# Patient Record
Sex: Female | Born: 1950 | Race: White | Hispanic: No | Marital: Married | Smoking: Former smoker
Health system: Southern US, Community
[De-identification: ages and names within clinical notes are randomized; demographics above are authoritative.]

## PROBLEM LIST (undated history)

## (undated) DIAGNOSIS — J45909 Unspecified asthma, uncomplicated: Secondary | ICD-10-CM

## (undated) DIAGNOSIS — G43909 Migraine, unspecified, not intractable, without status migrainosus: Secondary | ICD-10-CM

## (undated) DIAGNOSIS — K219 Gastro-esophageal reflux disease without esophagitis: Secondary | ICD-10-CM

## (undated) DIAGNOSIS — R413 Other amnesia: Secondary | ICD-10-CM

## (undated) DIAGNOSIS — E785 Hyperlipidemia, unspecified: Secondary | ICD-10-CM

## (undated) DIAGNOSIS — H269 Unspecified cataract: Secondary | ICD-10-CM

## (undated) DIAGNOSIS — R2689 Other abnormalities of gait and mobility: Secondary | ICD-10-CM

## (undated) DIAGNOSIS — T7840XA Allergy, unspecified, initial encounter: Secondary | ICD-10-CM

## (undated) DIAGNOSIS — F419 Anxiety disorder, unspecified: Secondary | ICD-10-CM

## (undated) DIAGNOSIS — T8859XA Other complications of anesthesia, initial encounter: Secondary | ICD-10-CM

## (undated) DIAGNOSIS — I10 Essential (primary) hypertension: Secondary | ICD-10-CM

## (undated) HISTORY — DX: Gastro-esophageal reflux disease without esophagitis: K21.9

## (undated) HISTORY — PX: BREAST EXCISIONAL BIOPSY: SUR124

## (undated) HISTORY — PX: ABDOMINAL HYSTERECTOMY: SHX81

## (undated) HISTORY — PX: CATARACT EXTRACTION, BILATERAL: SHX1313

## (undated) HISTORY — PX: CARPAL TUNNEL RELEASE: SHX101

## (undated) HISTORY — PX: BLADDER REPAIR: SHX6721

## (undated) HISTORY — DX: Hyperlipidemia, unspecified: E78.5

## (undated) HISTORY — DX: Unspecified cataract: H26.9

## (undated) HISTORY — DX: Migraine, unspecified, not intractable, without status migrainosus: G43.909

## (undated) HISTORY — PX: BREAST SURGERY: SHX581

## (undated) HISTORY — DX: Anxiety disorder, unspecified: F41.9

## (undated) HISTORY — DX: Essential (primary) hypertension: I10

## (undated) HISTORY — DX: Allergy, unspecified, initial encounter: T78.40XA

## (undated) HISTORY — DX: Other abnormalities of gait and mobility: R26.89

## (undated) HISTORY — PX: BREAST BIOPSY: SHX20

## (undated) HISTORY — DX: Other amnesia: R41.3

---

## 2014-04-07 HISTORY — PX: ABDOMINAL HYSTERECTOMY: SHX81

## 2016-04-07 HISTORY — PX: CATARACT EXTRACTION, BILATERAL: SHX1313

## 2016-06-21 ENCOUNTER — Ambulatory Visit: Payer: Self-pay

## 2016-07-12 ENCOUNTER — Ambulatory Visit (INDEPENDENT_AMBULATORY_CARE_PROVIDER_SITE_OTHER): Payer: BLUE CROSS/BLUE SHIELD | Admitting: Family Medicine

## 2016-07-12 ENCOUNTER — Encounter: Payer: Self-pay | Admitting: Family Medicine

## 2016-07-12 VITALS — BP 145/91 | HR 100 | Temp 98.3°F | Resp 18 | Ht 61.0 in | Wt 135.0 lb

## 2016-07-12 DIAGNOSIS — G43709 Chronic migraine without aura, not intractable, without status migrainosus: Secondary | ICD-10-CM

## 2016-07-12 DIAGNOSIS — E7800 Pure hypercholesterolemia, unspecified: Secondary | ICD-10-CM

## 2016-07-12 DIAGNOSIS — F32A Depression, unspecified: Secondary | ICD-10-CM

## 2016-07-12 DIAGNOSIS — F329 Major depressive disorder, single episode, unspecified: Secondary | ICD-10-CM

## 2016-07-12 DIAGNOSIS — F419 Anxiety disorder, unspecified: Secondary | ICD-10-CM

## 2016-07-12 MED ORDER — TOPIRAMATE 50 MG PO TABS: 100.0000 mg | ORAL_TABLET | Freq: Three times a day (TID) | ORAL | 1 refills | Status: DC

## 2016-07-12 MED ORDER — PRAVASTATIN SODIUM 20 MG PO TABS: 20.0000 mg | ORAL_TABLET | Freq: Every day | ORAL | 1 refills | Status: DC

## 2016-07-12 MED ORDER — SUMATRIPTAN SUCCINATE 100 MG PO TABS: 100.0000 mg | ORAL_TABLET | Freq: Once | ORAL | 1 refills | Status: DC

## 2016-07-12 MED ORDER — SERTRALINE HCL 100 MG PO TABS: 100.0000 mg | ORAL_TABLET | Freq: Every day | ORAL | 1 refills | Status: DC

## 2016-07-12 NOTE — Progress Notes (Signed)
Subjective:    Patient ID: Caroline [name], female    DOB: 1950-07-26, 66 y.o.   MRN: [id number]  07/12/2016  Establish Care (Recently moved here from [place]) and Medication Refill (All medications)   HPI This 66 y.o. female presents to establish care.  Recently moved from [place] and needs refills of all medications.  Last physical:  2017 Pap smear: hysterectomy; last pap smear with hysterectomy Mammogram: last year; 2017 Colonoscopy:  Not sure; due in 3 years; no polyps; repeat ten years after last colonoscopy Eye exam:  Works for ophthalmologist; +glasses; one years ago.  +cataract R eye Dental exam:  Not sure.    Hypercholesterolemia: Patient reports good compliance with medication, good tolerance to medication, and good symptom control.    Migraines: Patient reports good compliance with medication, good tolerance to medication, and good symptom control.  Getting 4-8 per month; weather and stress induced; barometer sensitive.  Sugar is a trigger.  Processed foods trigger.   Alcohol is a trigger.  Anxiety disorder: medication ran out in [place]; moved 05/23/16.  Emotionally terrible.  Crying excessively.  Husband has lung cancer; two years out from surgery.     Review of Systems  Constitutional: Negative for chills, diaphoresis, fatigue and fever.  Eyes: Negative for visual disturbance.  Respiratory: Negative for cough and shortness of breath.   Cardiovascular: Negative for chest pain, palpitations and leg swelling.  Gastrointestinal: Negative for abdominal pain, constipation, diarrhea, nausea and vomiting.  Endocrine: Negative for cold intolerance, heat intolerance, polydipsia, polyphagia and polyuria.  Neurological: Positive for headaches. Negative for dizziness, tremors, seizures, syncope, facial asymmetry, speech difficulty, weakness, light-headedness and numbness.  Psychiatric/Behavioral: Positive for dysphoric mood and sleep disturbance. Negative for self-injury and  suicidal ideas. The patient is nervous/anxious.     Past Medical History:  Diagnosis Date  . Allergy    Zyrtec, Benadryl  . Anxiety    Zoloft since several years  . Cataract   . Hyperlipidemia   . Migraines    topmax and Imitrex   Past Surgical History:  Procedure Laterality Date  . ABDOMINAL HYSTERECTOMY     uterine prolapse; ovaries INTACT  . BLADDER REPAIR    . BREAST SURGERY     breast bx x 2 in 1980s; benign   Allergies  Allergen Reactions  . Tetracyclines & Related Nausea And Vomiting  . Vicodin [Hydrocodone-Acetaminophen] Nausea And Vomiting    Social History   Social History  . Marital status: Married    Spouse name: N/A  . Number of children: 2  . Years of education: N/A   Occupational History  . employed    Social History Main Topics  . Smoking status: Never Smoker  . Smokeless tobacco: Never Used  . Alcohol use Yes     Comment: rarely  . Drug use: No  . Sexual activity: Not on file   Other Topics Concern  . 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      Alcohol: rare      Exercise: none   Family History  Problem Relation Age of Onset  . Dementia Mother   . Cancer Father 50    oral cancer  . Heart disease Father  PAD/femoral bypass  . Hypertension Father   . Hyperlipidemia Father   . Alcohol abuse Father   . Hyperlipidemia Sister   . Hypertension Sister        Objective:    BP (!) 145/91   Pulse 100   Temp 98.3 F (36.8 C) (Oral)   Resp 18   Ht  (1.549 m)   Wt 135 lb (61.2 kg)   SpO2 97%   BMI 25.51 kg/m  Physical Exam  Constitutional: She is oriented to person, place, and time. She appears well-developed and well-nourished. No distress.  HENT:  Head: Normocephalic and atraumatic.  Right Ear:  External ear normal.  Left Ear: External ear normal.  Nose: Nose normal.  Mouth/Throat: Oropharynx is clear and moist.  Eyes: Conjunctivae and EOM are normal. Pupils are equal, round, and reactive to light.  Neck: Normal range of motion. Neck supple. Carotid bruit is not present. No thyromegaly present.  Cardiovascular: Normal rate, regular rhythm, normal heart sounds and intact distal pulses.  Exam reveals no gallop and no friction rub.   No murmur heard. Pulmonary/Chest: Effort normal and breath sounds normal. She has no wheezes. She has no rales.  Abdominal: Soft. Bowel sounds are normal. She exhibits no distension and no mass. There is no tenderness. There is no rebound and no guarding.  Lymphadenopathy:    She has no cervical adenopathy.  Neurological: She is alert and oriented to person, place, and time. No cranial nerve deficit.  Skin: Skin is warm and dry. No rash noted. She is not diaphoretic. No erythema. No pallor.  Psychiatric: She has a normal mood and affect. Her behavior is normal.   No results found for this or any previous visit.     Assessment & Plan:   1. Pure hypercholesterolemia   2. Anxiety and depression   3. Chronic migraine without aura without status migrainosus, not intractable    -establishing care after recent move from [place]. -anxiety and depression poorly controlled due to family stressors; counseling provided; consider/recommend psychotherapy; rx for Zoloft  daily. -refill of Topamax and Imitrex provided; continues to suffer with frequent migraines likely due to recent family stressors. -hypercholesterolemia stable; RTC next visit fasting for labs.   No orders of the defined types were placed in this encounter.  Meds ordered this encounter  Medications  . DISCONTD: topiramate (TOPAMAX) 100 MG tablet    Sig: Take 100 mg by mouth daily.  Marland Kitchen DISCONTD: sertraline (ZOLOFT) 100 MG tablet    Sig: Take 100 mg by mouth daily.  Marland Kitchen DISCONTD:  pravastatin (PRAVACHOL) 20 MG tablet    Sig: Take 20 mg by mouth daily.  Marland Kitchen DISCONTD: SUMAtriptan (IMITREX) 100 MG tablet    Sig: Take 100 mg by mouth daily as needed for migraine. May repeat in 2 hours if headache persists or recurs.  . pravastatin (PRAVACHOL) 20 MG tablet    Sig: Take 1 tablet (20 mg total) by mouth daily.    Dispense:  90 tablet    Refill:  1  . sertraline (ZOLOFT) 100 MG tablet    Sig: Take 1 tablet (100 mg total) by mouth daily.    Dispense:  90 tablet    Refill:  1  . topiramate (TOPAMAX) 50 MG tablet    Sig: Take 2 tablets (100 mg total) by mouth 3 (three) times daily.    Dispense:  270 tablet    Refill:  1  . SUMAtriptan (IMITREX) 100 MG tablet  Sig: Take 1 tablet (100 mg total) by mouth once. May repeat in 2 hours if headache persists.    Dispense:  24 tablet    Refill:  1    Return in about 6 months (around 01/11/2017) for complete physical examiniation.   Beverlyn Mcginness Paulita Fujita, M.D. Primary Care at [facility] previously [facility] [address] [place], [place]  [address] [phone] phone [phone] fax

## 2016-07-12 NOTE — Patient Instructions (Addendum)
IF you received an x-ray today, you will receive an invoice from [facility]. Please contact [facility] at [phone] with questions or concerns regarding your invoice.   IF you received labwork today, you will receive an invoice from [facility]. Please contact [facility] at [phone] with questions or concerns regarding your invoice.   Our billing staff will not be able to assist you with questions regarding bills from these companies.  You will be contacted with the lab results as soon as they are available. The fastest way to get your results is to activate your My Chart account. Instructions are located on the last page of this paperwork. If you have not heard from [place] regarding the results in 2 weeks, please contact this office.      Stress and Stress Management Stress is a normal reaction to life events. It is what you feel when life demands more than you are used to or more than you can handle. Some stress can be useful. For example, the stress reaction can help you catch the last bus of the day, study for a test, or meet a deadline at work. But stress that occurs too often or for too long can cause problems. It can affect your emotional health and interfere with relationships and normal daily activities. Too much stress can weaken your immune system and increase your risk for physical illness. If you already have a medical problem, stress can make it worse. What are the causes? All sorts of life events may cause stress. An event that causes stress for one person may not be stressful for another person. Major life events commonly cause stress. These may be positive or negative. Examples include losing your job, moving into a new home, getting married, having a baby, or losing a loved one. Less obvious life events may also cause stress, especially if they occur day after day or in combination. Examples include working long hours, driving in traffic, caring for children,  being in debt, or being in a difficult relationship. What are the signs or symptoms? Stress may cause emotional symptoms including, the following:  Anxiety. This is feeling worried, afraid, on edge, overwhelmed, or out of control.  Anger. This is feeling irritated or impatient.  Depression. This is feeling sad, down, helpless, or guilty.  Difficulty focusing, remembering, or making decisions. Stress may cause physical symptoms, including the following:  Aches and pains. These may affect your head, neck, back, stomach, or other areas of your body.  Tight muscles or clenched jaw.  Low energy or trouble sleeping. Stress may cause unhealthy behaviors, including the following:  Eating to feel better (overeating) or skipping meals.  Sleeping too little, too much, or both.  Working too much or putting off tasks (procrastination).  Smoking, drinking alcohol, or using drugs to feel better. How is this diagnosed? Stress is diagnosed through an assessment by your health care provider. Your health care provider will ask questions about your symptoms and any stressful life events.Your health care provider will also ask about your medical history and may order blood tests or other tests. Certain medical conditions and medicine can cause physical symptoms similar to stress. Mental illness can cause emotional symptoms and unhealthy behaviors similar to stress. Your health care provider may refer you to a mental health professional for further evaluation. How is this treated? Stress management is the recommended treatment for stress.The goals of stress management are reducing stressful life events and coping with stress in healthy ways.  Techniques for reducing stressful life events include the following:  Stress identification. Self-monitor for stress and identify what causes stress for you. These skills may help you to avoid some stressful events.  Time management. Set your priorities, keep a  calendar of events, and learn to say "no." These tools can help you avoid making too many commitments. Techniques for coping with stress include the following:  Rethinking the problem. Try to think realistically about stressful events rather than ignoring them or overreacting. Try to find the positives in a stressful situation rather than focusing on the negatives.  Exercise. Physical exercise can release both physical and emotional tension. The key is to find a form of exercise you enjoy and do it regularly.  Relaxation techniques. These relax the body and mind. Examples include yoga, meditation, tai chi, biofeedback, deep breathing, progressive muscle relaxation, listening to music, being out in nature, journaling, and other hobbies. Again, the key is to find one or more that you enjoy and can do regularly.  Healthy lifestyle. Eat a balanced diet, get plenty of sleep, and do not smoke. Avoid using alcohol or drugs to relax.  Strong support network. Spend time with family, friends, or other people you enjoy being around.Express your feelings and talk things over with someone you trust. Counseling or talktherapy with a mental health professional may be helpful if you are having difficulty managing stress on your own. Medicine is typically not recommended for the treatment of stress.Talk to your health care provider if you think you need medicine for symptoms of stress. Follow these instructions at home:  Keep all follow-up visits as directed by your health care provider.  Take all medicines as directed by your health care provider. Contact a health care provider if:  Your symptoms get worse or you start having new symptoms.  You feel overwhelmed by your problems and can no longer manage them on your own. Get help right away if:  You feel like hurting yourself or someone else. This information is not intended to replace advice given to you by your health care provider. Make sure you  discuss any questions you have with your health care provider. Document Released: 09/17/2000 Document Revised: 08/30/2015 Document Reviewed: 11/16/2012 Elsevier Interactive Patient Education  2017 [employer].

## 2016-08-11 DIAGNOSIS — G43709 Chronic migraine without aura, not intractable, without status migrainosus: Secondary | ICD-10-CM | POA: Insufficient documentation

## 2016-08-11 DIAGNOSIS — F32A Depression, unspecified: Secondary | ICD-10-CM | POA: Insufficient documentation

## 2016-08-11 DIAGNOSIS — F419 Anxiety disorder, unspecified: Secondary | ICD-10-CM

## 2016-08-11 DIAGNOSIS — E7800 Pure hypercholesterolemia, unspecified: Secondary | ICD-10-CM | POA: Insufficient documentation

## 2016-08-11 DIAGNOSIS — F329 Major depressive disorder, single episode, unspecified: Secondary | ICD-10-CM | POA: Insufficient documentation

## 2016-11-05 ENCOUNTER — Other Ambulatory Visit: Payer: Self-pay | Admitting: Family Medicine

## 2016-12-28 ENCOUNTER — Emergency Department (HOSPITAL_BASED_OUTPATIENT_CLINIC_OR_DEPARTMENT_OTHER): Payer: BLUE CROSS/BLUE SHIELD

## 2016-12-28 ENCOUNTER — Encounter (HOSPITAL_BASED_OUTPATIENT_CLINIC_OR_DEPARTMENT_OTHER): Payer: Self-pay | Admitting: Emergency Medicine

## 2016-12-28 ENCOUNTER — Emergency Department (HOSPITAL_BASED_OUTPATIENT_CLINIC_OR_DEPARTMENT_OTHER)
Admission: EM | Admit: 2016-12-28 | Discharge: 2016-12-28 | Disposition: A | Discharge status: Home / Self Care | Payer: BLUE CROSS/BLUE SHIELD | Attending: Emergency Medicine | Admitting: Emergency Medicine

## 2016-12-28 DIAGNOSIS — R000 Tachycardia, unspecified: Secondary | ICD-10-CM | POA: Diagnosis not present

## 2016-12-28 DIAGNOSIS — I10 Essential (primary) hypertension: Secondary | ICD-10-CM | POA: Insufficient documentation

## 2016-12-28 DIAGNOSIS — G43009 Migraine without aura, not intractable, without status migrainosus: Secondary | ICD-10-CM | POA: Diagnosis not present

## 2016-12-28 DIAGNOSIS — R51 Headache: Secondary | ICD-10-CM | POA: Diagnosis present

## 2016-12-28 LAB — COMPREHENSIVE METABOLIC PANEL
ALBUMIN: 5.1 g/dL — AB (ref 3.5–5.0)
ALK PHOS: 89 U/L (ref 38–126)
ALT: 21 U/L (ref 14–54)
ANION GAP: 13 (ref 5–15)
AST: 29 U/L (ref 15–41)
BUN: 17 mg/dL (ref 6–20)
CALCIUM: 9.5 mg/dL (ref 8.9–10.3)
CO2: 21 mmol/L — AB (ref 22–32)
CREATININE: 0.58 mg/dL (ref 0.44–1.00)
Chloride: 104 mmol/L (ref 101–111)
GFR calc Af Amer: >60 mL/min (ref >60)
GFR calc non Af Amer: >60 mL/min (ref >60)
GLUCOSE: 156 mg/dL — AB (ref 65–99)
Potassium: 3.4 mmol/L — ABNORMAL LOW (ref 3.5–5.1)
SODIUM: 138 mmol/L (ref 135–145)
Total Bilirubin: 0.8 mg/dL (ref 0.3–1.2)
Total Protein: 8.5 g/dL — ABNORMAL HIGH (ref 6.5–8.1)

## 2016-12-28 LAB — CBC WITH DIFFERENTIAL/PLATELET
BASOS PCT: 0 %
Basophils Absolute: 0 10*3/uL (ref 0.0–0.1)
EOS ABS: 0 10*3/uL (ref 0.0–0.7)
Eosinophils Relative: 0 %
HCT: 43.6 % (ref 36.0–46.0)
HEMOGLOBIN: 14.8 g/dL (ref 12.0–15.0)
Lymphocytes Relative: 6 %
Lymphs Abs: 0.5 10*3/uL — ABNORMAL LOW (ref 0.7–4.0)
MCH: 31.8 pg (ref 26.0–34.0)
MCHC: 33.9 g/dL (ref 30.0–36.0)
MCV: 93.8 fL (ref 78.0–100.0)
MONO ABS: 0.2 10*3/uL (ref 0.1–1.0)
MONOS PCT: 2 %
NEUTROS ABS: 8.3 10*3/uL — AB (ref 1.7–7.7)
Neutrophils Relative %: 92 %
Platelets: 273 10*3/uL (ref 150–400)
RBC: 4.65 MIL/uL (ref 3.87–5.11)
RDW: 13.3 % (ref 11.5–15.5)
WBC: 9.1 10*3/uL (ref 4.0–10.5)

## 2016-12-28 LAB — TROPONIN I: Troponin I: <0.03 ng/mL (ref <0.03)

## 2016-12-28 LAB — D-DIMER, QUANTITATIVE: D-Dimer, Quant: 0.31 ug/mL-FEU (ref 0.00–0.50)

## 2016-12-28 IMAGING — DX DG ABDOMEN ACUTE W/ 1V CHEST
3 series · 3 of 3 positions shown · non-contrast
Comparison: None available for comparison at time of study
interpretation.

CLINICAL DATA: Nausea and vomiting beginning yesterday. Elevated
blood pressure.

EXAM:
DG ABDOMEN ACUTE W/ 1V CHEST

[chest pa]
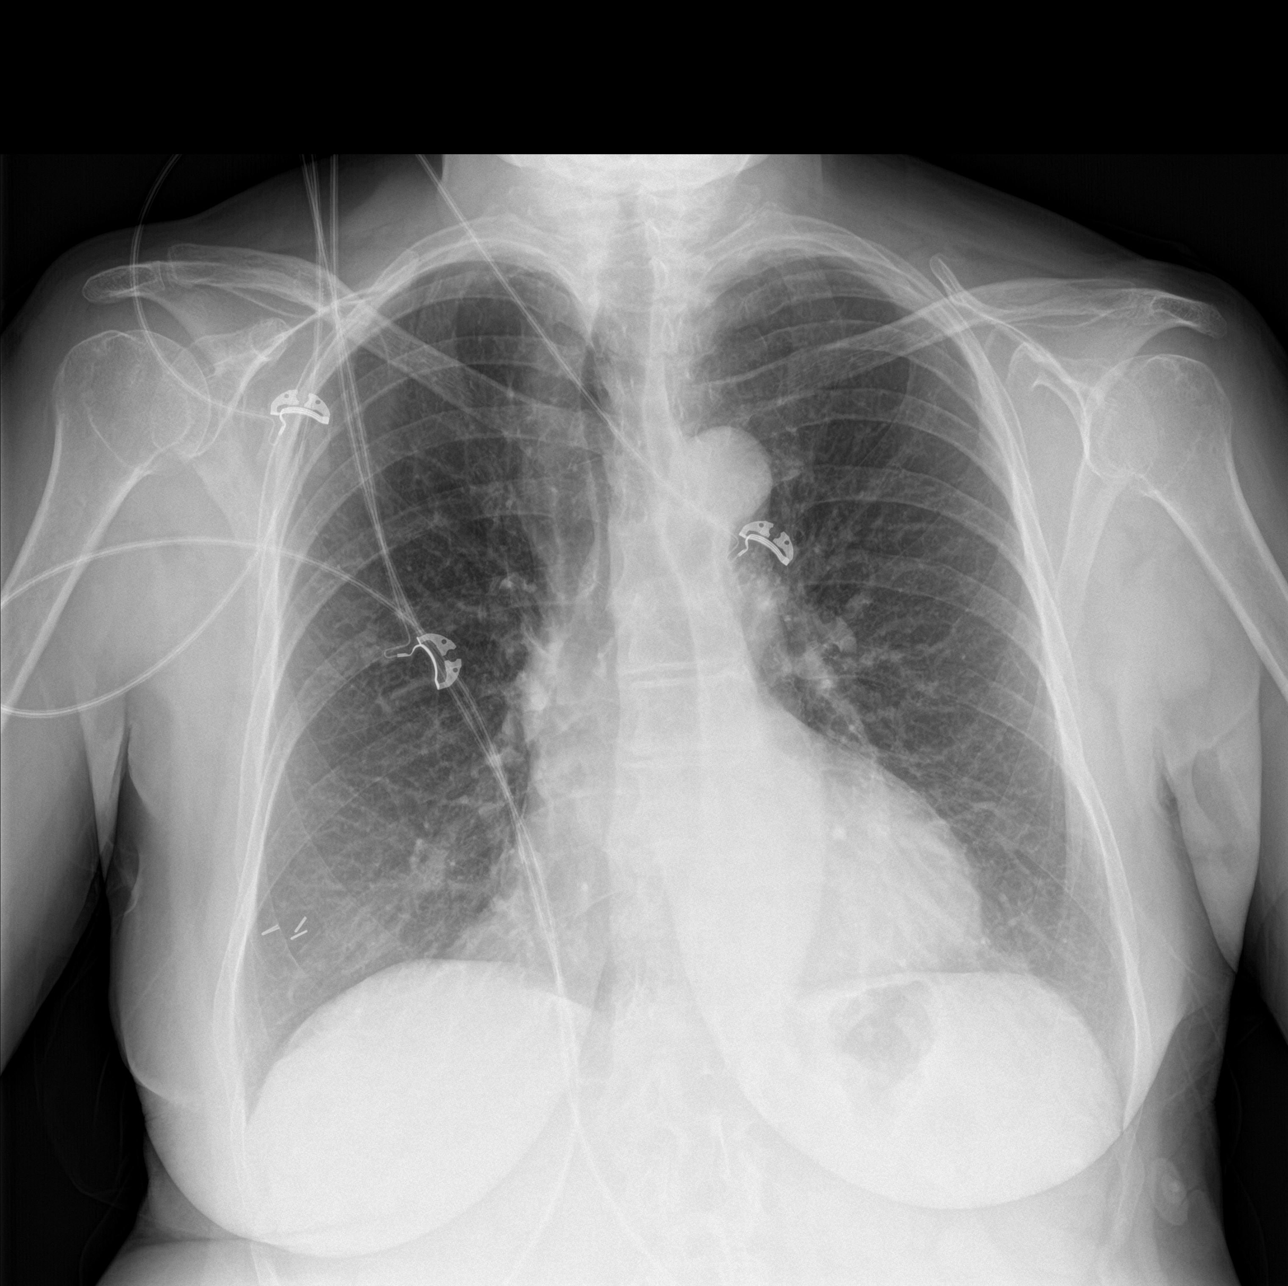

[abdomen erect]
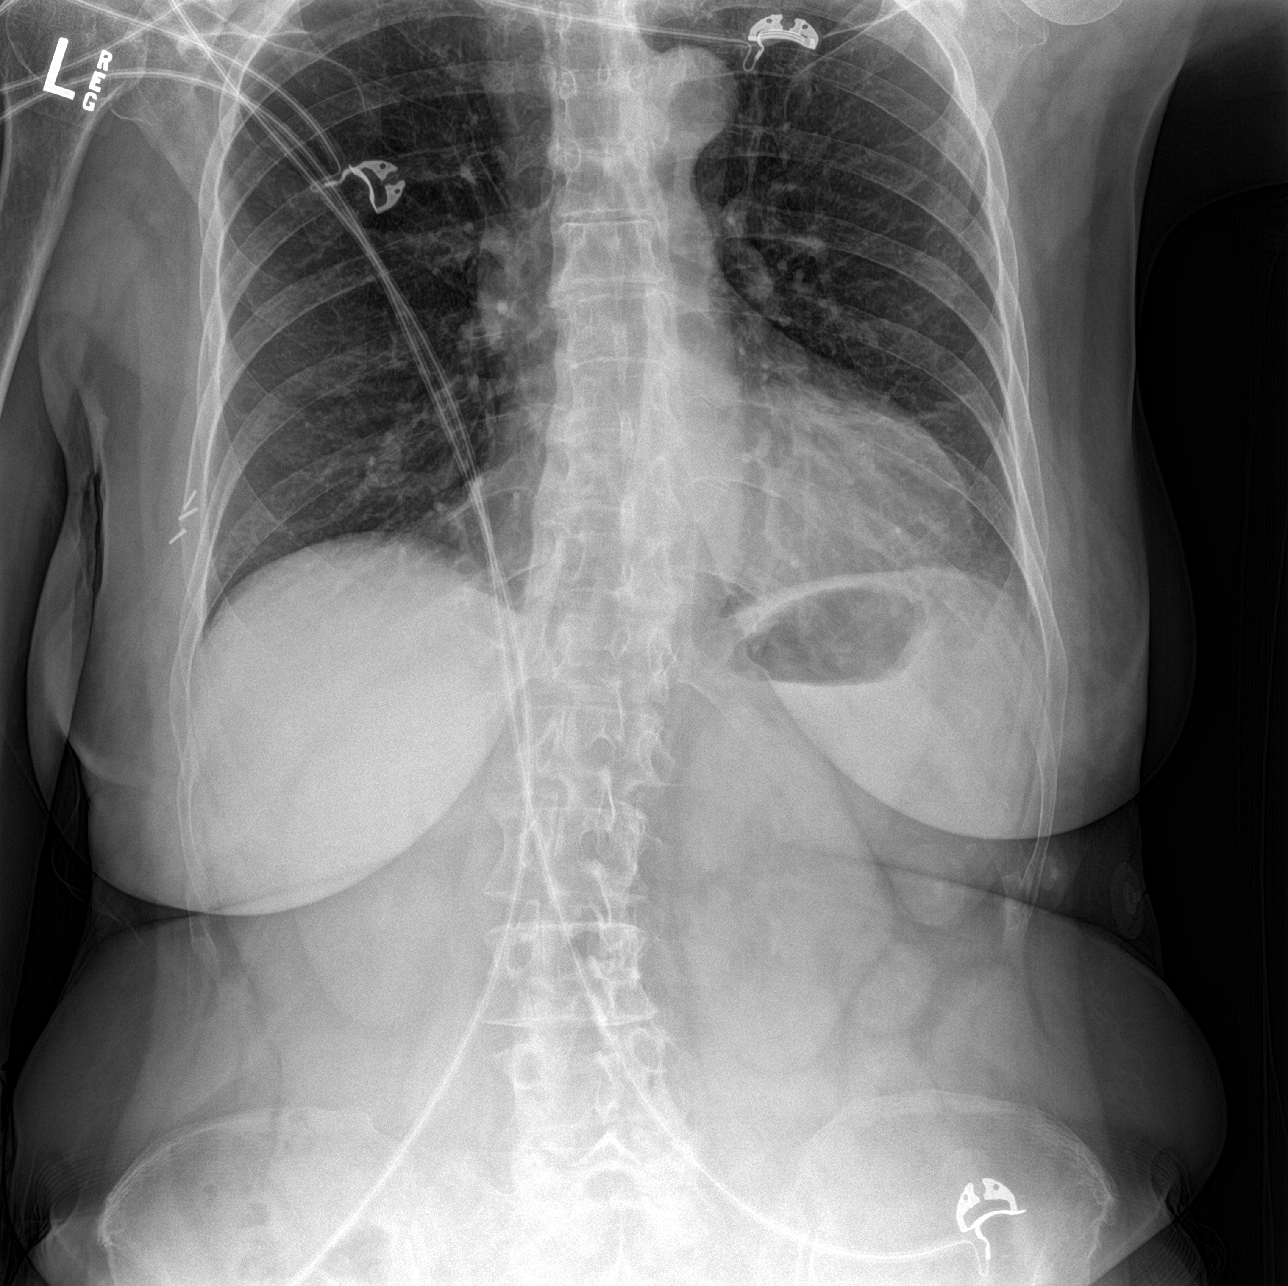

[abdomen supine]
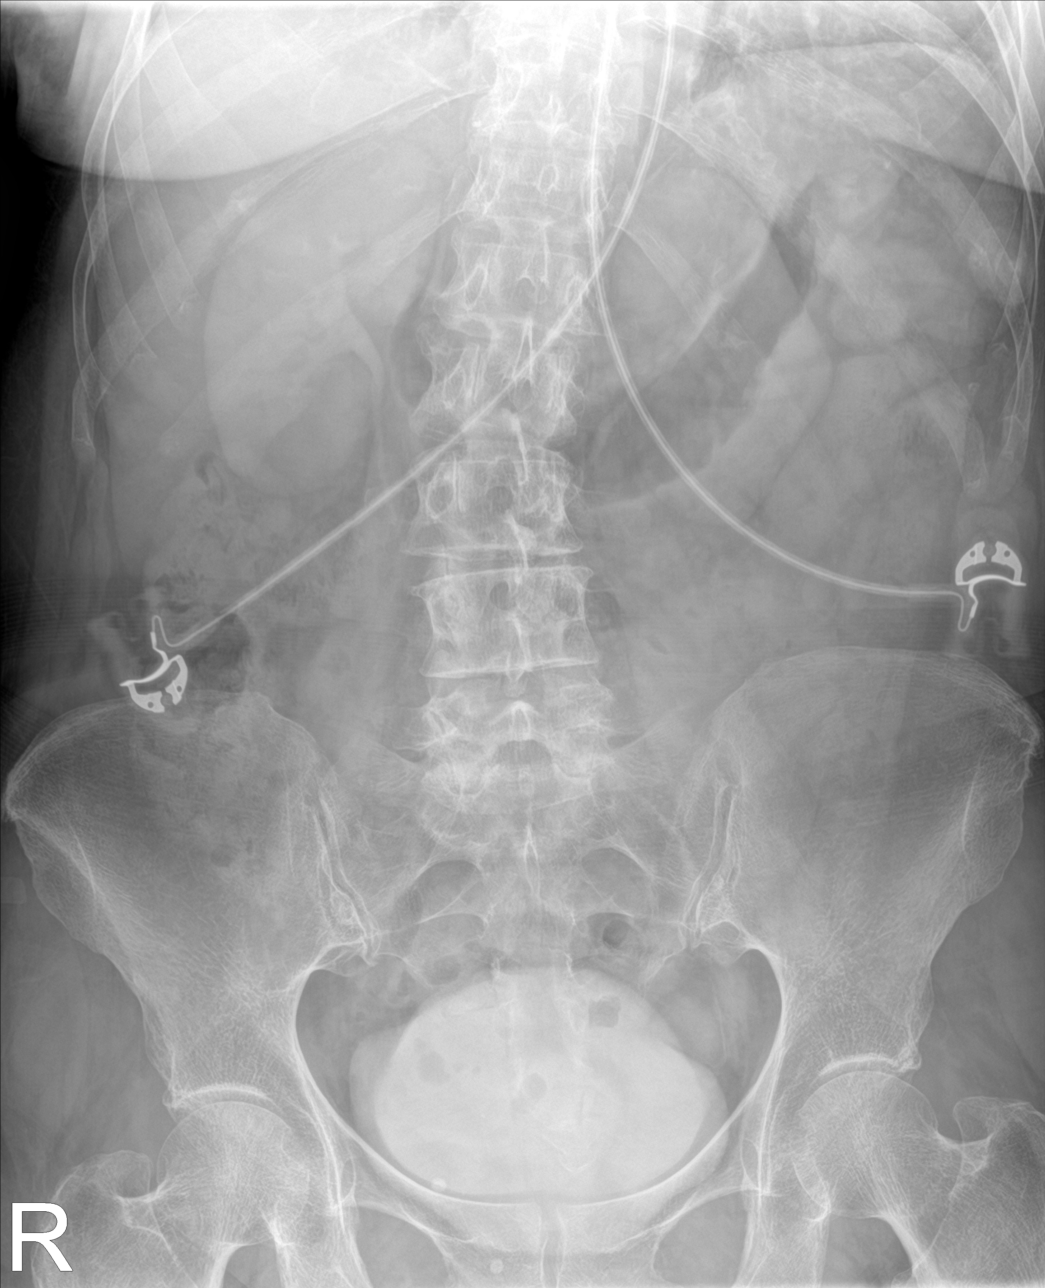

[3 of 3 positions shown; findings below may reference images not displayed]

FINDINGS: Cardiac silhouette is mildly enlarged. Mediastinal silhouette is
nonsuspicious. Lungs are clear, no pleural effusions. No
pneumothorax. Soft tissue planes and included osseous structures are
nonacute. Surgical clips project RIGHT breast.

Bowel gas pattern is nondilated and nonobstructive. Contrast in the
urinary collecting system. No intra-abdominal mass effect,
pathologic calcifications or free air. Phleboliths project in the
pelvis. Soft tissue planes and included osseous structures are
non-suspicious. Broad lumbar dextroscoliosis may be positional.
IMPRESSION: Mild cardiomegaly.  No acute pulmonary process.

Normal bowel gas pattern.

## 2016-12-28 IMAGING — CT CT ANGIO NECK
1 of 14 series · 5 of 33 positions shown · IV contrast (APPLIED)
Comparison: None.

CLINICAL DATA: Subarachnoid hemorrhage suspected.  Headache 3 days.

EXAM:
CT ANGIOGRAPHY HEAD AND NECK
TECHNIQUE: Multidetector CT imaging of the head and neck was performed using
the standard protocol during bolus administration of intravenous
contrast. Multiplanar CT image reconstructions and MIPs were
obtained to evaluate the vascular anatomy. Carotid stenosis
measurements (when applicable) are obtained utilizing NASCET
criteria, using the distal internal carotid diameter as the
denominator.
CONTRAST:  100 mL Isovue 370 IV

[Series 11: axial thin · axial · 0.49mm/px · z∈[+996,+1229]mm · 5 of 351 slices shown]
[im 59/351  soft-tissue]
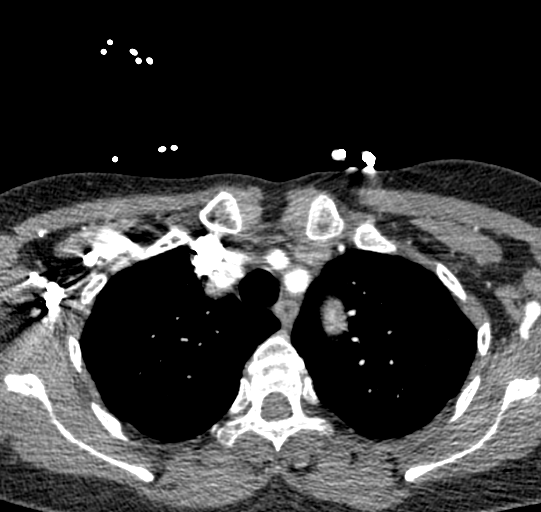
[im 117/351  bone]
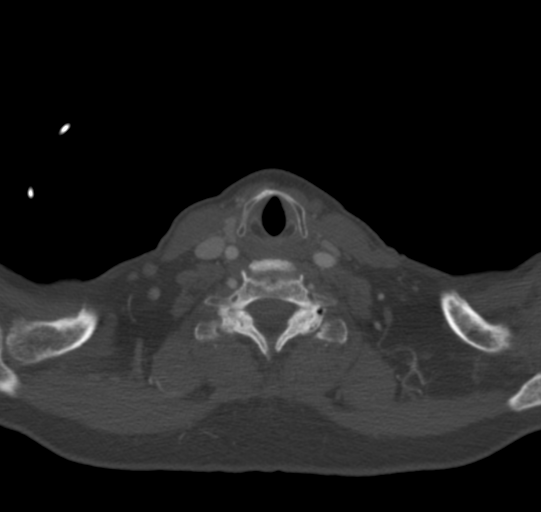
[im 176/351  soft-tissue]
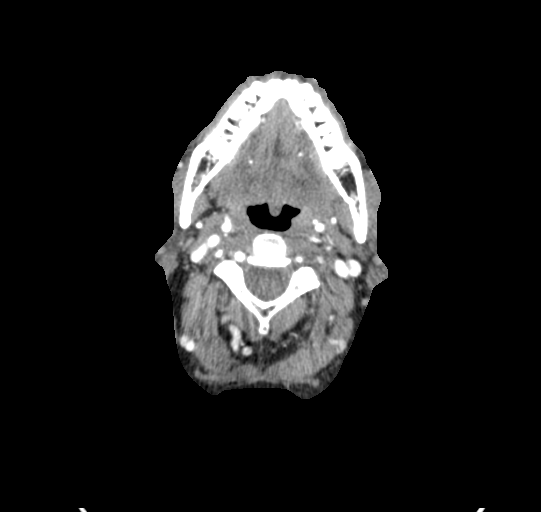
[im 234/351  bone]
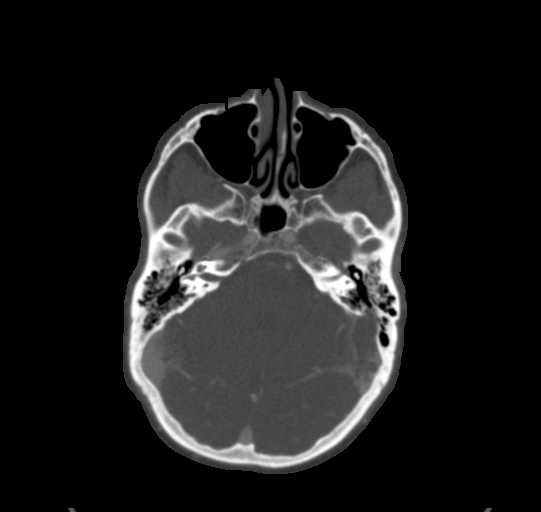
[im 292/351  soft-tissue]
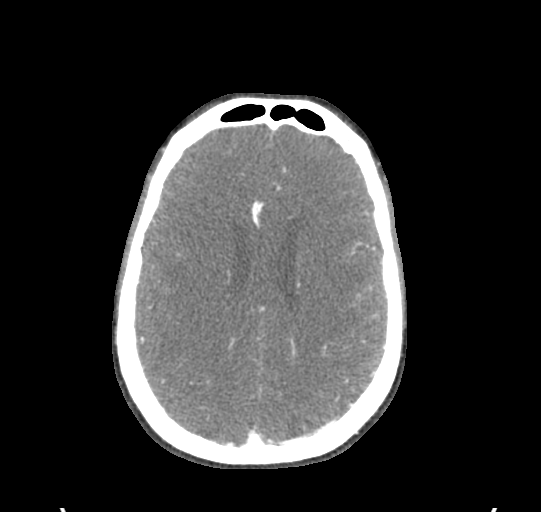

[5 of 33 positions shown; findings below may reference images not displayed]

FINDINGS: CT HEAD FINDINGS

Brain: Patchy hypodensity throughout the cerebral white matter
bilaterally. No evidence of acute infarct. Negative for hemorrhage
or mass. Ventricle size normal without midline shift.

Negative for subarachnoid hemorrhage

Vascular: Negative for hyperdense vessel.

Skull: Negative

Sinuses: Mild mucosal edema paranasal sinuses.  Normal orbit.

Orbits: Normal orbit

Review of the MIP images confirms the above findings

CTA NECK FINDINGS

Aortic arch: Normal aortic arch. Three vessel branching pattern.
Proximal great vessels widely patent.

Right carotid system: Right common carotid artery widely patent.
Right carotid bifurcation widely patent without stenosis

Left carotid system: Left common carotid artery widely patent. Left
carotid bifurcation widely patent with mild atherosclerotic
calcification

Vertebral arteries: Right vertebral dominant. Both vertebral artery
is patent without stenosis.

Skeleton: Cervical spine degenerative change. No acute skeletal
abnormality.

Other neck: Thyroid goiter.  No adenopathy.

Upper chest: Negative

Review of the MIP images confirms the above findings

CTA HEAD FINDINGS

Anterior circulation: Cavernous carotid patent bilaterally without
stenosis. Anterior and middle cerebral arteries patent bilaterally
without stenosis.

Posterior circulation: Right vertebral artery supplies the basilar.
Left vertebral artery ends in PICA. Basilar is widely patent.
Superior cerebellar and posterior cerebral artery patent
bilaterally. Fetal origin right posterior cerebral artery.

Venous sinuses: Patent

Anatomic variants: Negative for aneurysm

Delayed phase: Normal enhancement on delayed imaging.

Review of the MIP images confirms the above findings
IMPRESSION: Negative CTA head and neck. No significant arterial stenosis or
occlusion.

Negative for subarachnoid hemorrhage.

Moderate changes throughout the cerebral white matter most likely
due to chronic microvascular ischemia. If the patient has acute
neurologic deficit, recommend MRI to evaluate for acute infarct.

## 2016-12-28 MED ORDER — METOCLOPRAMIDE HCL 5 MG/ML IJ SOLN
10.0000 mg | Freq: Once | INTRAMUSCULAR | Status: AC
  Administered 2016-12-28: 10 mg via INTRAVENOUS
  Filled 2016-12-28: qty 2

## 2016-12-28 MED ORDER — DEXAMETHASONE SODIUM PHOSPHATE 10 MG/ML IJ SOLN
4.0000 mg | Freq: Once | INTRAMUSCULAR | Status: AC
  Administered 2016-12-28: 4 mg via INTRAVENOUS
  Filled 2016-12-28: qty 1

## 2016-12-28 MED ORDER — SODIUM CHLORIDE 0.9 % IV BOLUS (SEPSIS)
1000.0000 mL | Freq: Once | INTRAVENOUS | Status: AC
  Administered 2016-12-28: 1000 mL via INTRAVENOUS

## 2016-12-28 MED ORDER — METOPROLOL TARTRATE 5 MG/5ML IV SOLN
10.0000 mg | Freq: Once | INTRAVENOUS | Status: AC
  Administered 2016-12-28: 10 mg via INTRAVENOUS
  Filled 2016-12-28: qty 10

## 2016-12-28 MED ORDER — METOPROLOL SUCCINATE ER 50 MG PO TB24: 50.0000 mg | ORAL_TABLET | Freq: Every day | ORAL | 0 refills | Status: DC

## 2016-12-28 MED ORDER — KETOROLAC TROMETHAMINE 30 MG/ML IJ SOLN
30.0000 mg | Freq: Once | INTRAMUSCULAR | Status: AC
  Administered 2016-12-28: 30 mg via INTRAVENOUS
  Filled 2016-12-28: qty 1

## 2016-12-28 MED ORDER — DIPHENHYDRAMINE HCL 50 MG/ML IJ SOLN
25.0000 mg | Freq: Once | INTRAMUSCULAR | Status: AC
  Administered 2016-12-28: 25 mg via INTRAVENOUS
  Filled 2016-12-28: qty 1

## 2016-12-28 MED ORDER — HYDRALAZINE HCL 20 MG/ML IJ SOLN
5.0000 mg | Freq: Once | INTRAMUSCULAR | Status: AC
  Administered 2016-12-28: 5 mg via INTRAVENOUS
  Filled 2016-12-28: qty 1

## 2016-12-28 MED ORDER — IOPAMIDOL (ISOVUE-370) INJECTION 76%
100.0000 mL | Freq: Once | INTRAVENOUS | Status: AC | PRN
  Administered 2016-12-28: 100 mL via INTRAVENOUS

## 2016-12-28 MED ORDER — PROMETHAZINE HCL 25 MG PO TABS: 25.0000 mg | ORAL_TABLET | Freq: Four times a day (QID) | ORAL | 0 refills | Status: DC | PRN

## 2016-12-28 MED ORDER — LORAZEPAM 2 MG/ML IJ SOLN
1.0000 mg | Freq: Once | INTRAMUSCULAR | Status: AC
  Administered 2016-12-28: 1 mg via INTRAVENOUS
  Filled 2016-12-28: qty 1

## 2016-12-28 MED ORDER — PROMETHAZINE HCL 25 MG/ML IJ SOLN
25.0000 mg | Freq: Once | INTRAMUSCULAR | Status: AC
  Administered 2016-12-28: 25 mg via INTRAVENOUS
  Filled 2016-12-28: qty 1

## 2016-12-28 NOTE — ED Notes (Signed)
Lab aware of add on d-dimer

## 2016-12-28 NOTE — ED Notes (Signed)
Since returning from ct, pt heart rate has been elevated in the 120's, now consistently in the 130's. Dr. Silverio Lay made aware of the same, ekg obtained.

## 2016-12-28 NOTE — ED Triage Notes (Addendum)
Pt c/o migraine w/ NV since Fri; migraine meds not helping; referred here from [facility] for elevated BP with no hx of same

## 2016-12-28 NOTE — Discharge Instructions (Signed)
Take tylenol, motrin for headaches.   Continue imitrex as needed for severe headaches.   Take phenergan for nausea.   Your blood pressure and heart rate is elevated. Take metoprolol as prescribed.   See your doctor in a week to recheck blood pressure.   See your neurologist for follow up   Return to ER if you have worse headaches, vomiting, chest pain, trouble breathing, weakness, numbness

## 2016-12-28 NOTE — ED Notes (Signed)
Patient transported to CT 

## 2016-12-28 NOTE — ED Notes (Signed)
Ct will require a 20G AC IV for angio study, also must wait for bun/creat prior to exam with IV contrast per protocol (pt >60 yrs of age)

## 2016-12-28 NOTE — ED Provider Notes (Signed)
[facility] Provider Note   CSN: [id number] Arrival date & time: 12/28/16  1712     History   Chief Complaint Chief Complaint  Patient presents with  . Migraine  . Emesis    HPI Caroline [name] is a 66 y.o. female history hyperlipidemia, migraines who presenting with headaches, hypertension. A shunt states that she had a tooth extraction 3 days ago and afterwards her blood pressure went up to 150-160. She states that eventually it did come down to about 130 and she went home. Over the last 2 days, she has worsening diffuse headaches associated with nausea and vomiting. She knows her blood pressure went up to 150-160 again as well. She states that she has a history of migraines and took some of her prescribed Imitrex but vomited up and did not help. Patient states that this is worse in her usual migraines. She went to urgent care and was sent here for new onset hypertension, worsening headaches. No hx of aneurysms or subarachnoid previously.    The history is provided by the patient.    Past Medical History:  Diagnosis Date  . Allergy    Zyrtec, Benadryl  . Anxiety    Zoloft since several years  . Cataract   . Hyperlipidemia   . Migraines    topmax and Imitrex    Patient Active Problem List   Diagnosis Date Noted  . Pure hypercholesterolemia 08/11/2016  . Anxiety and depression 08/11/2016  . Chronic migraine without aura without status migrainosus, not intractable 08/11/2016    Past Surgical History:  Procedure Laterality Date  . ABDOMINAL HYSTERECTOMY     uterine prolapse; ovaries INTACT  . BLADDER REPAIR    . BREAST SURGERY     breast bx x 2 in 1980s; benign    OB History    No data available       Home Medications    Prior to Admission medications   Medication Sig Start Date End Date Taking? Authorizing Provider  pravastatin (PRAVACHOL) 20 MG tablet Take 1 tablet (20 mg total) by mouth daily. 07/12/16   Ethelda Chick, MD  sertraline (ZOLOFT)  100 MG tablet Take 1 tablet (100 mg total) by mouth daily. 07/12/16   Ethelda Chick, MD  SUMAtriptan (IMITREX) 100 MG tablet TAKE 1 TABLET BY MOUTH ONCE DAILY MAY  REPEAT  IN  2  HOURS  IF  HEADACHE  PERSISTS 11/05/16   Ethelda Chick, MD  topiramate (TOPAMAX) 50 MG tablet Take 2 tablets (100 mg total) by mouth 3 (three) times daily. 07/12/16   Ethelda Chick, MD    Family History Family History  Problem Relation Age of Onset  . Dementia Mother   . Cancer Father 29       oral cancer  . Heart disease Father        PAD/femoral bypass  . Hypertension Father   . Hyperlipidemia Father   . Alcohol abuse Father   . Hyperlipidemia Sister   . Hypertension Sister     Social History Social History  Substance Use Topics  . Smoking status: Never Smoker  . Smokeless tobacco: Never Used  . Alcohol use Yes     Comment: rarely     Allergies   Tetracyclines & related and Vicodin [hydrocodone-acetaminophen]   Review of Systems Review of Systems  Gastrointestinal: Positive for vomiting.  Neurological: Positive for headaches.  All other systems reviewed and are negative.    Physical Exam Updated Vital Signs  BP (!) 148/89   Pulse (!) 107   Temp 98.6 F (37 C) (Oral)   Resp 19   Ht  (1.575 m)   Wt 58.5 kg (129 lb)   SpO2 98%   BMI 23.59 kg/m   Physical Exam  Constitutional: She is oriented to person, place, and time.  Uncomfortable, nauseated   HENT:  Head: Normocephalic.  MM dry   Eyes: Pupils are equal, round, and reactive to light. Conjunctivae and EOM are normal.  Neck: Normal range of motion. Neck supple.  Cardiovascular: Normal rate, regular rhythm and normal heart sounds.   Pulmonary/Chest: Effort normal and breath sounds normal. No respiratory distress. She has no wheezes. She has no rales.  Abdominal: Soft. Bowel sounds are normal. She exhibits no distension. There is no tenderness.  Musculoskeletal: Normal range of motion.  Neurological: She is alert and  oriented to person, place, and time.  CN 2-12 intact, nl strength throughout, nl finger to nose, nl gait   Skin: Skin is warm.  Psychiatric: She has a normal mood and affect. Her behavior is normal.  Nursing note and vitals reviewed.    ED Treatments / Results  Labs (all labs ordered are listed, but only abnormal results are displayed) Labs Reviewed  CBC WITH DIFFERENTIAL/PLATELET - Abnormal; Notable for the following:       Result Value   Neutro Abs 8.3 (*)    Lymphs Abs 0.5 (*)    All other components within normal limits  COMPREHENSIVE METABOLIC PANEL - Abnormal; Notable for the following:    Potassium 3.4 (*)    CO2 21 (*)    Glucose, Bld 156 (*)    Total Protein 8.5 (*)    Albumin 5.1 (*)    All other components within normal limits  TROPONIN I  D-DIMER, QUANTITATIVE (NOT AT [facility])    EKG  EKG Interpretation  Date/Time:  Sunday December 28 2016 19:52:56 EDT Ventricular Rate:  131 PR Interval:    QRS Duration: 82 QT Interval:  316 QTC Calculation: 467 R Axis:   92 Text Interpretation:  Sinus tachycardia Probable left atrial enlargement Right axis deviation Repol abnrm suggests ischemia, diffuse leads No previous ECGs available Confirmed by Richardean Canal ([id number]) on 12/28/2016 7:58:37 PM       Radiology Ct Angio Head W Or Wo Contrast  Result Date: 12/28/2016 CLINICAL DATA:  Subarachnoid hemorrhage suspected.  Headache 3 days. EXAM: CT ANGIOGRAPHY HEAD AND NECK TECHNIQUE: Multidetector CT imaging of the head and neck was performed using the standard protocol during bolus administration of intravenous contrast. Multiplanar CT image reconstructions and MIPs were obtained to evaluate the vascular anatomy. Carotid stenosis measurements (when applicable) are obtained utilizing NASCET criteria, using the distal internal carotid diameter as the denominator. CONTRAST:  100 mL Isovue 370 IV COMPARISON:  None. FINDINGS: CT HEAD FINDINGS Brain: Patchy hypodensity throughout the  cerebral white matter bilaterally. No evidence of acute infarct. Negative for hemorrhage or mass. Ventricle size normal without midline shift. Negative for subarachnoid hemorrhage Vascular: Negative for hyperdense vessel. Skull: Negative Sinuses: Mild mucosal edema paranasal sinuses.  Normal orbit. Orbits: Normal orbit Review of the MIP images confirms the above findings CTA NECK FINDINGS Aortic arch: Normal aortic arch. Three vessel branching pattern. Proximal great vessels widely patent. Right carotid system: Right common carotid artery widely patent. Right carotid bifurcation widely patent without stenosis Left carotid system: Left common carotid artery widely patent. Left carotid bifurcation widely patent with mild atherosclerotic calcification Vertebral  arteries: Right vertebral dominant. Both vertebral artery is patent without stenosis. Skeleton: Cervical spine degenerative change. No acute skeletal abnormality. Other neck: Thyroid goiter.  No adenopathy. Upper chest: Negative Review of the MIP images confirms the above findings CTA HEAD FINDINGS Anterior circulation: Cavernous carotid patent bilaterally without stenosis. Anterior and middle cerebral arteries patent bilaterally without stenosis. Posterior circulation: Right vertebral artery supplies the basilar. Left vertebral artery ends in PICA. Basilar is widely patent. Superior cerebellar and posterior cerebral artery patent bilaterally. Fetal origin right posterior cerebral artery. Venous sinuses: Patent Anatomic variants: Negative for aneurysm Delayed phase: Normal enhancement on delayed imaging. Review of the MIP images confirms the above findings IMPRESSION: Negative CTA head and neck. No significant arterial stenosis or occlusion. Negative for subarachnoid hemorrhage. Moderate changes throughout the cerebral white matter most likely due to chronic microvascular ischemia. If the patient has acute neurologic deficit, recommend MRI to evaluate for acute  infarct. Electronically Signed   By: Marlan Palau M.D.   On: 12/28/2016 19:26   Ct Angio Neck W And/or Wo Contrast  Result Date: 12/28/2016 CLINICAL DATA:  Subarachnoid hemorrhage suspected.  Headache 3 days. EXAM: CT ANGIOGRAPHY HEAD AND NECK TECHNIQUE: Multidetector CT imaging of the head and neck was performed using the standard protocol during bolus administration of intravenous contrast. Multiplanar CT image reconstructions and MIPs were obtained to evaluate the vascular anatomy. Carotid stenosis measurements (when applicable) are obtained utilizing NASCET criteria, using the distal internal carotid diameter as the denominator. CONTRAST:  100 mL Isovue 370 IV COMPARISON:  None. FINDINGS: CT HEAD FINDINGS Brain: Patchy hypodensity throughout the cerebral white matter bilaterally. No evidence of acute infarct. Negative for hemorrhage or mass. Ventricle size normal without midline shift. Negative for subarachnoid hemorrhage Vascular: Negative for hyperdense vessel. Skull: Negative Sinuses: Mild mucosal edema paranasal sinuses.  Normal orbit. Orbits: Normal orbit Review of the MIP images confirms the above findings CTA NECK FINDINGS Aortic arch: Normal aortic arch. Three vessel branching pattern. Proximal great vessels widely patent. Right carotid system: Right common carotid artery widely patent. Right carotid bifurcation widely patent without stenosis Left carotid system: Left common carotid artery widely patent. Left carotid bifurcation widely patent with mild atherosclerotic calcification Vertebral arteries: Right vertebral dominant. Both vertebral artery is patent without stenosis. Skeleton: Cervical spine degenerative change. No acute skeletal abnormality. Other neck: Thyroid goiter.  No adenopathy. Upper chest: Negative Review of the MIP images confirms the above findings CTA HEAD FINDINGS Anterior circulation: Cavernous carotid patent bilaterally without stenosis. Anterior and middle cerebral arteries  patent bilaterally without stenosis. Posterior circulation: Right vertebral artery supplies the basilar. Left vertebral artery ends in PICA. Basilar is widely patent. Superior cerebellar and posterior cerebral artery patent bilaterally. Fetal origin right posterior cerebral artery. Venous sinuses: Patent Anatomic variants: Negative for aneurysm Delayed phase: Normal enhancement on delayed imaging. Review of the MIP images confirms the above findings IMPRESSION: Negative CTA head and neck. No significant arterial stenosis or occlusion. Negative for subarachnoid hemorrhage. Moderate changes throughout the cerebral white matter most likely due to chronic microvascular ischemia. If the patient has acute neurologic deficit, recommend MRI to evaluate for acute infarct. Electronically Signed   By: Marlan Palau M.D.   On: 12/28/2016 19:26   Dg Abd Acute W/chest  Result Date: 12/28/2016 CLINICAL DATA:  Nausea and vomiting beginning yesterday. Elevated blood pressure. EXAM: DG ABDOMEN ACUTE W/ 1V CHEST COMPARISON:  None available for comparison at time of study interpretation. FINDINGS: Cardiac silhouette is mildly enlarged. Mediastinal silhouette  is nonsuspicious. Lungs are clear, no pleural effusions. No pneumothorax. Soft tissue planes and included osseous structures are nonacute. Surgical clips project RIGHT breast. Bowel gas pattern is nondilated and nonobstructive. Contrast in the urinary collecting system. No intra-abdominal mass effect, pathologic calcifications or free air. Phleboliths project in the pelvis. Soft tissue planes and included osseous structures are non-suspicious. Broad lumbar dextroscoliosis may be positional. IMPRESSION: Mild cardiomegaly.  No acute pulmonary process. Normal bowel gas pattern. Electronically Signed   By: Awilda Metro M.D.   On: 12/28/2016 20:52    Procedures Procedures (including critical care time)  Medications Ordered in ED Medications  sodium chloride 0.9 % bolus  1,000 mL (0 mLs Intravenous Stopped 12/28/16 1853)  metoCLOPramide (REGLAN) injection 10 mg (10 mg Intravenous Given 12/28/16 1748)  diphenhydrAMINE (BENADRYL) injection 25 mg (25 mg Intravenous Given 12/28/16 1748)  hydrALAZINE (APRESOLINE) injection 5 mg (5 mg Intravenous Given 12/28/16 1748)  iopamidol (ISOVUE-370) 76 % injection 100 mL (100 mLs Intravenous Contrast Given 12/28/16 1856)  promethazine (PHENERGAN) injection 25 mg (25 mg Intravenous Given 12/28/16 1940)  dexamethasone (DECADRON) injection 4 mg (4 mg Intravenous Given 12/28/16 1940)  ketorolac (TORADOL) 30 MG/ML injection 30 mg (30 mg Intravenous Given 12/28/16 1940)  sodium chloride 0.9 % bolus 1,000 mL (0 mLs Intravenous Stopped 12/28/16 2152)  metoprolol tartrate (LOPRESSOR) injection 10 mg (10 mg Intravenous Given 12/28/16 2056)  LORazepam (ATIVAN) injection 1 mg (1 mg Intravenous Given 12/28/16 2155)     Initial Impression / Assessment and Plan / ED Course  I have reviewed the triage vital signs and the nursing notes.  Pertinent labs & imaging results that were available during my care of the patient were reviewed by me and considered in my medical decision making (see chart for details).     Caroline [name] is a 66 y.o. female here with headaches, vomiting, hypertension. Consider symptomatic hypertension vs hypertensive bleed vs dissection vs aneurysm rupture vs migraines. Will get CTA head/neck, labs. Will give migraine cocktail, hydralazine and reassess   8 pm Labs unremarkable. CTA head/neck showed no dissection or aneurysm or bleed. Became tachycardic to the 130s persistently. Sinus tachy on 12 lead. Will get trop and d-dimer. Still has headache so will give phenergan, toradol, decadron.   10:05 PM D-dimer neg. Trop neg. Continues to deny chest pain. HR down to 107 after lopressor. BP down to 140s now. I think likely symptomatic hypertension. Has imitrex at home. Will try metoprolol. Recommend PCP follow up in a week for BP  check.   Final Clinical Impressions(s) / ED Diagnoses   Final diagnoses:  None    New Prescriptions New Prescriptions   No medications on file     Charlynne Pander, MD 12/28/16 2206

## 2017-01-03 ENCOUNTER — Encounter: Payer: Self-pay | Admitting: Family Medicine

## 2017-01-03 ENCOUNTER — Ambulatory Visit (INDEPENDENT_AMBULATORY_CARE_PROVIDER_SITE_OTHER): Payer: BLUE CROSS/BLUE SHIELD | Admitting: Family Medicine

## 2017-01-03 VITALS — BP 138/88 | HR 83 | Temp 98.0°F | Resp 16 | Ht 61.81 in | Wt 133.0 lb

## 2017-01-03 DIAGNOSIS — F419 Anxiety disorder, unspecified: Secondary | ICD-10-CM

## 2017-01-03 DIAGNOSIS — G43709 Chronic migraine without aura, not intractable, without status migrainosus: Secondary | ICD-10-CM

## 2017-01-03 DIAGNOSIS — F329 Major depressive disorder, single episode, unspecified: Secondary | ICD-10-CM

## 2017-01-03 DIAGNOSIS — Z23 Encounter for immunization: Secondary | ICD-10-CM

## 2017-01-03 DIAGNOSIS — F32A Depression, unspecified: Secondary | ICD-10-CM

## 2017-01-03 DIAGNOSIS — E7800 Pure hypercholesterolemia, unspecified: Secondary | ICD-10-CM | POA: Diagnosis not present

## 2017-01-03 DIAGNOSIS — I10 Essential (primary) hypertension: Secondary | ICD-10-CM

## 2017-01-03 MED ORDER — BUTALBITAL-APAP-CAFFEINE 50-325-40 MG PO TABS: 1.0000 | ORAL_TABLET | Freq: Four times a day (QID) | ORAL | 2 refills | Status: DC | PRN

## 2017-01-03 MED ORDER — METOPROLOL SUCCINATE ER 100 MG PO TB24: 100.0000 mg | ORAL_TABLET | Freq: Every day | ORAL | 2 refills | Status: DC

## 2017-01-03 MED ORDER — AMITRIPTYLINE HCL 10 MG PO TABS: 10.0000 mg | ORAL_TABLET | Freq: Every day | ORAL | 5 refills | Status: DC

## 2017-01-03 NOTE — Progress Notes (Signed)
Subjective:    Patient ID: Caroline [name], female    DOB: February 14, 1951, 66 y.o.   MRN: [id number]  01/03/2017  Hypertension (1 week follow-up ) and Migraine   HPI This 66 y.o. female presents for evaluation of headaches and hypertension.   Gave Reglan and Benadryl.  Had been taking Topamax for two years; weaned off of Topamax because it had made pt feel weird.    Metoprolol ER  daily is for hypertension. Wakes up every morning, wakes up with pounding headache.  Onset forever it seems; last two months.   Husband's health has also deteriorated in the past month. 141/73 this morning at home.  170/103 on Thursday.  Had not missed Metoprolol.  Lowest reading this morning 141/73.  Sinus pressure and drainage. Chronic issue.  Stopped taking Sudafed.   Not sleepign well because so worreid about headache.  Having migraines rarely; first migraine in six months.     BP Readings from Last 3 Encounters:  01/03/17 138/88  12/28/16 (!) 141/108  07/12/16 (!) 145/91   Wt Readings from Last 3 Encounters:  01/03/17 133 lb (60.3 kg)  12/28/16 129 lb (58.5 kg)  07/12/16 135 lb (61.2 kg)   Immunization History  Administered Date(s) Administered  . Influenza,inj,Quad PF,6+ Mos 01/03/2017    Review of Systems  Constitutional: Negative for chills, diaphoresis, fatigue and fever.  Eyes: Positive for photophobia. Negative for visual disturbance.  Respiratory: Negative for cough and shortness of breath.   Cardiovascular: Negative for chest pain, palpitations and leg swelling.  Gastrointestinal: Negative for abdominal pain, constipation, diarrhea, nausea and vomiting.  Endocrine: Negative for cold intolerance, heat intolerance, polydipsia, polyphagia and polyuria.  Neurological: Positive for headaches. Negative for dizziness, tremors, seizures, syncope, facial asymmetry, speech difficulty, weakness, light-headedness and numbness.  Psychiatric/Behavioral: Positive for sleep disturbance. Negative  for dysphoric mood, self-injury and suicidal ideas. The patient is nervous/anxious.     Past Medical History:  Diagnosis Date  . Allergy    Zyrtec, Benadryl  . Anxiety    Zoloft since several years  . Cataract   . Hyperlipidemia   . Migraines    topmax and Imitrex   Past Surgical History:  Procedure Laterality Date  . ABDOMINAL HYSTERECTOMY     uterine prolapse; ovaries INTACT  . BLADDER REPAIR    . BREAST SURGERY     breast bx x 2 in 1980s; benign   Allergies  Allergen Reactions  . Tetracyclines & Related Nausea And Vomiting  . Vicodin [Hydrocodone-Acetaminophen] Nausea And Vomiting    Social History   Social History  . Marital status: Married    Spouse name: N/A  . Number of children: 2  . Years of education: N/A   Occupational History  . employed    Social History Main Topics  . Smoking status: Never Smoker  . Smokeless tobacco: Never Used  . Alcohol use Yes     Comment: rarely  . Drug use: No  . Sexual activity: Not on file   Other Topics Concern  . 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      Alcohol: rare      Exercise: none   Family History  Problem Relation Age of Onset  .  Dementia Mother   . Cancer Father 78       oral cancer  . Heart disease Father        PAD/femoral bypass  . Hypertension Father   . Hyperlipidemia Father   . Alcohol abuse Father   . Hyperlipidemia Sister   . Hypertension Sister        Objective:    BP 138/88   Pulse 83   Temp 98 F (36.7 C) (Oral)   Resp 16   Ht 5' 1.81" (1.57 m)   Wt 133 lb (60.3 kg)   SpO2 98%   BMI 24.48 kg/m  Physical Exam  Constitutional: She is oriented to person, place, and time. She appears well-developed and well-nourished. No distress.  HENT:  Head:  Normocephalic and atraumatic.  Right Ear: External ear normal.  Left Ear: External ear normal.  Nose: Nose normal.  Mouth/Throat: Oropharynx is clear and moist.  Eyes: Pupils are equal, round, and reactive to light. Conjunctivae and EOM are normal.  Neck: Normal range of motion. Neck supple. Carotid bruit is not present. No thyromegaly present.  Cardiovascular: Normal rate, regular rhythm, normal heart sounds and intact distal pulses.  Exam reveals no gallop and no friction rub.   No murmur heard. Pulmonary/Chest: Effort normal and breath sounds normal. She has no wheezes. She has no rales.  Abdominal: Soft. Bowel sounds are normal. She exhibits no distension and no mass. There is no tenderness. There is no rebound and no guarding.  Lymphadenopathy:    She has no cervical adenopathy.  Neurological: She is alert and oriented to person, place, and time. No cranial nerve deficit. She exhibits normal muscle tone. Coordination normal.  Skin: Skin is warm and dry. No rash noted. She is not diaphoretic. No erythema. No pallor.  Psychiatric: She has a normal mood and affect. Her behavior is normal. Judgment and thought content normal.    No results found. Depression screen [facility] 2/9 01/03/2017 07/12/2016  Decreased Interest 0 0  Down, Depressed, Hopeless 0 0  PHQ - 2 Score 0 0   Fall Risk  01/03/2017 07/12/2016  Falls in the past year? No No        Assessment & Plan:   1. Essential hypertension   2. Chronic migraine without aura without status migrainosus, not intractable   3. Anxiety and depression   4. Pure hypercholesterolemia   5. Need for prophylactic vaccination and inoculation against influenza    -new onset hypertension uncontrolled and very likely contributing to daily headaches; increase Metoprolol ER to  daily.  Obtain labs at next visit.  Check BP daily and bring log into next appointment. -uncontrolled migraines; weaned Topamax as ineffective; increase Metoprolol ER to   daily.  Add Amitriptyline  qhs.  S/p ED visit and records reviewed in office including CT angio head and neck.  Refer to neurology.  Agreeable to bridge with Fioricet until migraines improve; pt intolerant to prednisone taper.   Orders Placed This Encounter  Procedures  . Flu Vaccine QUAD 36+ mos IM   Meds ordered this encounter  Medications  . metoprolol succinate (TOPROL-XL) 100 MG 24 hr tablet    Sig: Take 1 tablet (100 mg total) by mouth daily.    Dispense:  30 tablet    Refill:  2  . butalbital-acetaminophen-caffeine (FIORICET, ESGIC) 50-325-40 MG tablet    Sig: Take 1-2 tablets by mouth every 6 (six) hours as needed for headache.    Dispense:  60 tablet  Refill:  2  . amitriptyline (ELAVIL) 10 MG tablet    Sig: Take 1-2 tablets (10-20 mg total) by mouth at bedtime.    Dispense:  60 tablet    Refill:  5    Return in about 2 weeks (around 01/17/2017) for complete physical examiniation.   Raed Schalk Paulita Fujita, M.D. Primary Care at [facility] previously [facility] [address] [place], [place]  [address] [phone] phone [phone] fax

## 2017-01-03 NOTE — Patient Instructions (Addendum)
   Start taking FLONASE 2 sprays into each nostril once daily. Use a SINUS RINSE with distilled water once per day for two weeks. Continue Zyrtec  daily.    Increase Metoprolol to  daily. Start Amitriptyline  at bedtime.  Goal BP is 120/70 (but no lower than 105/55). Goal heart rate is above 55-100.  IF you received an x-ray today, you will receive an invoice from [facility]. Please contact [facility] at [phone] with questions or concerns regarding your invoice.   IF you received labwork today, you will receive an invoice from [facility]. Please contact [facility] at [phone] with questions or concerns regarding your invoice.   Our billing staff will not be able to assist you with questions regarding bills from these companies.  You will be contacted with the lab results as soon as they are available. The fastest way to get your results is to activate your My Chart account. Instructions are located on the last page of this paperwork. If you have not heard from [place] regarding the results in 2 weeks, please contact this office.

## 2017-01-14 ENCOUNTER — Ambulatory Visit (INDEPENDENT_AMBULATORY_CARE_PROVIDER_SITE_OTHER): Payer: BLUE CROSS/BLUE SHIELD | Admitting: Family Medicine

## 2017-01-14 ENCOUNTER — Encounter: Payer: Self-pay | Admitting: Family Medicine

## 2017-01-14 VITALS — BP 138/80 | HR 72 | Temp 98.0°F | Resp 16 | Ht 62.6 in | Wt 137.0 lb

## 2017-01-14 DIAGNOSIS — Z0000 Encounter for general adult medical examination without abnormal findings: Secondary | ICD-10-CM | POA: Diagnosis not present

## 2017-01-14 DIAGNOSIS — F419 Anxiety disorder, unspecified: Secondary | ICD-10-CM | POA: Diagnosis not present

## 2017-01-14 DIAGNOSIS — Z1231 Encounter for screening mammogram for malignant neoplasm of breast: Secondary | ICD-10-CM

## 2017-01-14 DIAGNOSIS — E7800 Pure hypercholesterolemia, unspecified: Secondary | ICD-10-CM | POA: Diagnosis not present

## 2017-01-14 DIAGNOSIS — F329 Major depressive disorder, single episode, unspecified: Secondary | ICD-10-CM | POA: Diagnosis not present

## 2017-01-14 DIAGNOSIS — Z114 Encounter for screening for human immunodeficiency virus [HIV]: Secondary | ICD-10-CM | POA: Diagnosis not present

## 2017-01-14 DIAGNOSIS — G43709 Chronic migraine without aura, not intractable, without status migrainosus: Secondary | ICD-10-CM | POA: Diagnosis not present

## 2017-01-14 DIAGNOSIS — Z1159 Encounter for screening for other viral diseases: Secondary | ICD-10-CM

## 2017-01-14 DIAGNOSIS — I10 Essential (primary) hypertension: Secondary | ICD-10-CM | POA: Diagnosis not present

## 2017-01-14 DIAGNOSIS — Z1239 Encounter for other screening for malignant neoplasm of breast: Secondary | ICD-10-CM

## 2017-01-14 LAB — POCT URINALYSIS DIP (MANUAL ENTRY)
BILIRUBIN UA: NEGATIVE mg/dL
Bilirubin, UA: NEGATIVE
Glucose, UA: NEGATIVE mg/dL
NITRITE UA: NEGATIVE
PH UA: 6 (ref 5.0–8.0)
PROTEIN UA: NEGATIVE mg/dL
Spec Grav, UA: 1.015 (ref 1.010–1.025)
Urobilinogen, UA: 0.2 E.U./dL

## 2017-01-14 NOTE — Progress Notes (Signed)
Subjective:    Patient ID: Caroline [name], female    DOB: Nov 17, 1950, 66 y.o.   MRN: [id number]  01/14/2017  Annual Exam    HPI This 66 y.o. female presents for evaluation of Complete Physical Examination.  Last physical: 2017 Pap smear:  Uterine prolapse two years ago; bladder repair; no abnormal pap smear.  Ovaries remaining.  Mammogram: 2017 Colonoscopy:  Two in the past; age 31; age 69.  Never can get through intestine; prep makes violently ill.   Bone density:    Not sure. Eye exam:  Glasses; works for eye doctor. Dental exam:  Appointment tomorrow.  BP Readings from Last 3 Encounters:  01/14/17 138/80  01/03/17 138/88  12/28/16 (!) 141/108   Wt Readings from Last 3 Encounters:  01/14/17 137 lb (62.1 kg)  01/03/17 133 lb (60.3 kg)  12/28/16 129 lb (58.5 kg)   Immunization History  Administered Date(s) Administered  . Influenza,inj,Quad PF,6+ Mos 01/03/2017  . Zoster 04/08/2015   HTN: Patient reports good compliance with medication, good tolerance to medication, and good symptom control.   Home BP running 115/75-150/75-89 pulse 68-82.  No side effects to Metoprolol.    Migraines: no recurrent migraine since last visit.  Pharmacy did not fill Amitriptyline due to possible drug-drug interaction.   Depression/anxiety: husband likely with lung cancer metastasis; increased stressors.   Review of Systems  Constitutional: Negative for activity change, appetite change, chills, diaphoresis, fatigue, fever and unexpected weight change.  HENT: Negative for congestion, dental problem, drooling, ear discharge, ear pain, facial swelling, hearing loss, mouth sores, nosebleeds, postnasal drip, rhinorrhea, sinus pressure, sneezing, sore throat, tinnitus, trouble swallowing and voice change.   Eyes: Negative for photophobia, pain, discharge, redness, itching and visual disturbance.  Respiratory: Negative for apnea, cough, choking, chest tightness, shortness of breath, wheezing and  stridor.   Cardiovascular: Negative for chest pain, palpitations and leg swelling.  Gastrointestinal: Positive for constipation. Negative for abdominal distention, abdominal pain, anal bleeding, blood in stool, diarrhea, nausea, rectal pain and vomiting.  Endocrine: Negative for cold intolerance, heat intolerance, polydipsia, polyphagia and polyuria.  Genitourinary: Negative for decreased urine volume, difficulty urinating, dyspareunia, dysuria, enuresis, flank pain, frequency, genital sores, hematuria, menstrual problem, pelvic pain, urgency, vaginal bleeding, vaginal discharge and vaginal pain.       Nocturia x 2-3.  Urinary leakage urge; wears small pad.  Musculoskeletal: Negative for arthralgias, back pain, gait problem, joint swelling, myalgias, neck pain and neck stiffness.  Skin: Negative for color change, pallor, rash and wound.  Allergic/Immunologic: Negative for environmental allergies, food allergies and immunocompromised state.  Neurological: Positive for headaches. Negative for dizziness, tremors, seizures, syncope, facial asymmetry, speech difficulty, weakness, light-headedness and numbness.  Hematological: Negative for adenopathy. Does not bruise/bleed easily.  Psychiatric/Behavioral: Positive for dysphoric mood. Negative for agitation, behavioral problems, confusion, decreased concentration, hallucinations, self-injury, sleep disturbance and suicidal ideas. The patient is nervous/anxious. The patient is not hyperactive.     Past Medical History:  Diagnosis Date  . Allergy    Zyrtec, Benadryl  . Anxiety    Zoloft since several years  . Cataract   . Hyperlipidemia   . Hypertension   . Migraines    topmax and Imitrex   Past Surgical History:  Procedure Laterality Date  . ABDOMINAL HYSTERECTOMY     uterine prolapse; ovaries INTACT  . BLADDER REPAIR    . BREAST SURGERY     breast bx x 2 in 1980s; benign   Allergies  Allergen Reactions  .  Tetracyclines & Related Nausea  And Vomiting  . Vicodin [Hydrocodone-Acetaminophen] Nausea And Vomiting   Current Outpatient Prescriptions on File Prior to Visit  Medication Sig Dispense Refill  . amitriptyline (ELAVIL) 10 MG tablet Take 1-2 tablets (10-20 mg total) by mouth at bedtime. 60 tablet 5  . butalbital-acetaminophen-caffeine (FIORICET, ESGIC) 50-325-40 MG tablet Take 1-2 tablets by mouth every 6 (six) hours as needed for headache. 60 tablet 2  . metoprolol succinate (TOPROL-XL) 100 MG 24 hr tablet Take 1 tablet (100 mg total) by mouth daily. 30 tablet 2  . pravastatin (PRAVACHOL) 20 MG tablet Take 1 tablet (20 mg total) by mouth daily. 90 tablet 1  . promethazine (PHENERGAN) 25 MG tablet Take 1 tablet (25 mg total) by mouth every 6 (six) hours as needed for nausea or vomiting. 10 tablet 0  . SUMAtriptan (IMITREX) 100 MG tablet TAKE 1 TABLET BY MOUTH ONCE DAILY MAY  REPEAT  IN  2  HOURS  IF  HEADACHE  PERSISTS 24 tablet 1   No current facility-administered medications on file prior to visit.    Social History   Social History  . Marital status: Married    Spouse name: N/A  . Number of children: 2  . Years of education: N/A   Occupational History  . employed    Social History Main Topics  . Smoking status: Never Smoker  . Smokeless tobacco: Never Used  . Alcohol use Yes     Comment: rarely  . Drug use: No  . Sexual activity: Yes    Birth control/ protection: Post-menopausal, Surgical   Other Topics Concern  . 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      Alcohol: rare      Exercise: none   Family History  Problem Relation Age of Onset  . Dementia Mother   . Cancer Father 57       oral cancer  . Heart disease Father        PAD/femoral bypass  .  Hypertension Father   . Hyperlipidemia Father   . Alcohol abuse Father   . Hyperlipidemia Sister   . Hypertension Sister        Objective:    BP 138/80   Pulse 72   Temp 98 F (36.7 C) (Oral)   Resp 16   Ht 5' 2.6" (1.59 m)   Wt 137 lb (62.1 kg)   SpO2 97%   BMI 24.58 kg/m  Physical Exam  Constitutional: She is oriented to person, place, and time. She appears well-developed and well-nourished. No distress.  HENT:  Head: Normocephalic and atraumatic.  Right Ear: External ear normal.  Left Ear: External ear normal.  Nose: Nose normal.  Mouth/Throat: Oropharynx is clear and moist.  Eyes: Pupils are equal, round, and reactive to light. Conjunctivae and EOM are normal.  Neck: Normal range of motion and full passive range of motion without pain. Neck supple. No JVD present. Carotid bruit is not present. No thyromegaly present.  Cardiovascular: Normal rate, regular rhythm and normal heart sounds.  Exam reveals no gallop and no friction rub.   No murmur heard. Pulmonary/Chest: Effort normal and breath sounds normal. She has no wheezes.  She has no rales. Right breast exhibits no inverted nipple, no mass, no nipple discharge, no skin change and no tenderness. Left breast exhibits no inverted nipple, no mass, no nipple discharge, no skin change and no tenderness. Breasts are symmetrical.  Abdominal: Soft. Bowel sounds are normal. She exhibits no distension and no mass. There is no tenderness. There is no rebound and no guarding.  Musculoskeletal:       Right shoulder: Normal.       Left shoulder: Normal.       Cervical back: Normal.  Lymphadenopathy:    She has no cervical adenopathy.  Neurological: She is alert and oriented to person, place, and time. She has normal reflexes. No cranial nerve deficit. She exhibits normal muscle tone. Coordination normal.  Skin: Skin is warm and dry. No rash noted. She is not diaphoretic. No erythema. No pallor.  Psychiatric: She has a normal mood and  affect. Her speech is normal and behavior is normal. Judgment and thought content normal. Cognition and memory are normal.  Flat affect.  Nursing note and vitals reviewed.  No results found. Depression screen [facility] 2/9 01/14/2017 01/03/2017 07/12/2016  Decreased Interest 0 0 0  Down, Depressed, Hopeless 0 0 0  PHQ - 2 Score 0 0 0   Fall Risk  01/14/2017 01/03/2017 07/12/2016  Falls in the past year? No No No        Assessment & Plan:   1. Routine physical examination   2. Chronic migraine without aura without status migrainosus, not intractable   3. Pure hypercholesterolemia   4. Anxiety and depression   5. Screening for breast cancer   6. Essential hypertension   7. Screening for HIV (human immunodeficiency virus)   8. Need for hepatitis C screening test    -anticipatory guidance provided --- exercise, weight loss, safe driving practices, aspirin  daily. -obtain age appropriate screening labs and labs for chronic disease management. -hypertension improved with Metoprolol rx. Obtain labs.   -migraines much improved with Metoprolol.  Did not fill Amitriptyline and likely does not need it at this time.    Orders Placed This Encounter  Procedures  . MM DIGITAL SCREENING BILATERAL    Standing Status:   Future    Standing Expiration Date:   03/16/2018    Order Specific Question:   Reason for Exam (SYMPTOM  OR DIAGNOSIS REQUIRED)    Answer:   screening for breast cancer    Order Specific Question:   Preferred imaging location?    Answer:   [facility]  . CBC with Differential/Platelet  . Comprehensive metabolic panel    Order Specific Question:   Has the patient fasted?    Answer:   No  . Lipid panel    Order Specific Question:   Has the patient fasted?    Answer:   No  . TSH  . Hepatitis C antibody  . HIV antibody  . HIV antibody  . Hepatitis C antibody  . POCT urinalysis dipstick  . EKG 12-Lead   No orders of the defined types were placed in this  encounter.   Return in about 3 months (around 04/16/2017) for follow-up chronic medical conditions.   Laveta Gilkey Paulita Fujita, M.D. Primary Care at [facility] previously [facility] [address] [place], [place]  [address] [phone] phone [phone] fax

## 2017-01-14 NOTE — Patient Instructions (Addendum)
IF you received an x-ray today, you will receive an invoice from [facility]. Please contact [facility] at [phone] with questions or concerns regarding your invoice.   IF you received labwork today, you will receive an invoice from [facility]. Please contact [facility] at [phone] with questions or concerns regarding your invoice.   Our billing staff will not be able to assist you with questions regarding bills from these companies.  You will be contacted with the lab results as soon as they are available. The fastest way to get your results is to activate your My Chart account. Instructions are located on the last page of this paperwork. If you have not heard from [place] regarding the results in 2 weeks, please contact this office.      Preventive Care 40-64 Years, Female Preventive care refers to lifestyle choices and visits with your health care provider that can promote health and wellness. What does preventive care include?  A yearly physical exam. This is also called an annual well check.  Dental exams once or twice a year.  Routine eye exams. Ask your health care provider how often you should have your eyes checked.  Personal lifestyle choices, including: ? Daily care of your teeth and gums. ? Regular physical activity. ? Eating a healthy diet. ? Avoiding tobacco and drug use. ? Limiting alcohol use. ? Practicing safe sex. ? Taking low-dose aspirin daily starting at age 63. ? Taking vitamin and mineral supplements as recommended by your health care provider. What happens during an annual well check? The services and screenings done by your health care provider during your annual well check will depend on your age, overall health, lifestyle risk factors, and family history of disease. Counseling Your health care provider may ask you questions about your:  Alcohol use.  Tobacco use.  Drug use.  Emotional well-being.  Home and relationship  well-being.  Sexual activity.  Eating habits.  Work and work Statistician.  Method of birth control.  Menstrual cycle.  Pregnancy history.  Screening You may have the following tests or measurements:  Height, weight, and BMI.  Blood pressure.  Lipid and cholesterol levels. These may be checked every 5 years, or more frequently if you are over 46 years old.  Skin check.  Lung cancer screening. You may have this screening every year starting at age 45 if you have a 30-pack-year history of smoking and currently smoke or have quit within the past 15 years.  Fecal occult blood test (FOBT) of the stool. You may have this test every year starting at age 75.  Flexible sigmoidoscopy or colonoscopy. You may have a sigmoidoscopy every 5 years or a colonoscopy every 10 years starting at age 19.  Hepatitis C blood test.  Hepatitis B blood test.  Sexually transmitted disease (STD) testing.  Diabetes screening. This is done by checking your blood sugar (glucose) after you have not eaten for a while (fasting). You may have this done every 1-3 years.  Mammogram. This may be done every 1-2 years. Talk to your health care provider about when you should start having regular mammograms. This may depend on whether you have a family history of breast cancer.  BRCA-related cancer screening. This may be done if you have a family history of breast, ovarian, tubal, or peritoneal cancers.  Pelvic exam and Pap test. This may be done every 3 years starting at age 71. Starting at age 98, this may be done every 5 years if  you have a Pap test in combination with an HPV test.  Bone density scan. This is done to screen for osteoporosis. You may have this scan if you are at high risk for osteoporosis.  Discuss your test results, treatment options, and if necessary, the need for more tests with your health care provider. Vaccines Your health care provider may recommend certain vaccines, such  as:  Influenza vaccine. This is recommended every year.  Tetanus, diphtheria, and acellular pertussis (Tdap, Td) vaccine. You may need a Td booster every 10 years.  Varicella vaccine. You may need this if you have not been vaccinated.  Zoster vaccine. You may need this after age 67.  Measles, mumps, and rubella (MMR) vaccine. You may need at least one dose of MMR if you were born in 1957 or later. You may also need a second dose.  Pneumococcal 13-valent conjugate (PCV13) vaccine. You may need this if you have certain conditions and were not previously vaccinated.  Pneumococcal polysaccharide (PPSV23) vaccine. You may need one or two doses if you smoke cigarettes or if you have certain conditions.  Meningococcal vaccine. You may need this if you have certain conditions.  Hepatitis A vaccine. You may need this if you have certain conditions or if you travel or work in places where you may be exposed to hepatitis A.  Hepatitis B vaccine. You may need this if you have certain conditions or if you travel or work in places where you may be exposed to hepatitis B.  Haemophilus influenzae type b (Hib) vaccine. You may need this if you have certain conditions.  Talk to your health care provider about which screenings and vaccines you need and how often you need them. This information is not intended to replace advice given to you by your health care provider. Make sure you discuss any questions you have with your health care provider. Document Released: 04/20/2015 Document Revised: 12/12/2015 Document Reviewed: 01/23/2015 Elsevier Interactive Patient Education  2017 [employer].

## 2017-01-15 LAB — LIPID PANEL
Chol/HDL Ratio: 3.9 ratio (ref 0.0–4.4)
Cholesterol, Total: 216 mg/dL — ABNORMAL HIGH (ref 100–199)
HDL: 56 mg/dL (ref >39)
LDL Calculated: 130 mg/dL — ABNORMAL HIGH (ref 0–99)
Triglycerides: 150 mg/dL — ABNORMAL HIGH (ref 0–149)
VLDL CHOLESTEROL CAL: 30 mg/dL (ref 5–40)

## 2017-01-15 LAB — COMPREHENSIVE METABOLIC PANEL
A/G RATIO: 1.9 (ref 1.2–2.2)
ALBUMIN: 4.6 g/dL (ref 3.6–4.8)
ALT: 12 IU/L (ref 0–32)
AST: 16 IU/L (ref 0–40)
Alkaline Phosphatase: 100 IU/L (ref 39–117)
BUN / CREAT RATIO: 24 (ref 12–28)
BUN: 15 mg/dL (ref 8–27)
CHLORIDE: 106 mmol/L (ref 96–106)
CO2: 22 mmol/L (ref 20–29)
Calcium: 9.3 mg/dL (ref 8.7–10.3)
Creatinine, Ser: 0.62 mg/dL (ref 0.57–1.00)
GFR calc non Af Amer: 96 mL/min/{1.73_m2} (ref >59)
GFR, EST AFRICAN AMERICAN: 110 mL/min/{1.73_m2} (ref >59)
Globulin, Total: 2.4 g/dL (ref 1.5–4.5)
Glucose: 93 mg/dL (ref 65–99)
POTASSIUM: 4.8 mmol/L (ref 3.5–5.2)
SODIUM: 143 mmol/L (ref 134–144)
TOTAL PROTEIN: 7 g/dL (ref 6.0–8.5)

## 2017-01-15 LAB — CBC WITH DIFFERENTIAL/PLATELET
BASOS: 1 %
Basophils Absolute: 0 10*3/uL (ref 0.0–0.2)
EOS (ABSOLUTE): 0.1 10*3/uL (ref 0.0–0.4)
Eos: 3 %
HEMOGLOBIN: 12.3 g/dL (ref 11.1–15.9)
Hematocrit: 36.3 % (ref 34.0–46.6)
IMMATURE GRANS (ABS): 0 10*3/uL (ref 0.0–0.1)
Immature Granulocytes: 0 %
LYMPHS: 27 %
Lymphocytes Absolute: 1.2 10*3/uL (ref 0.7–3.1)
MCH: 31.4 pg (ref 26.6–33.0)
MCHC: 33.9 g/dL (ref 31.5–35.7)
MCV: 93 fL (ref 79–97)
MONOCYTES: 7 %
Monocytes Absolute: 0.3 10*3/uL (ref 0.1–0.9)
NEUTROS ABS: 2.8 10*3/uL (ref 1.4–7.0)
Neutrophils: 62 %
Platelets: 271 10*3/uL (ref 150–379)
RBC: 3.92 x10E6/uL (ref 3.77–5.28)
RDW: 13.7 % (ref 12.3–15.4)
WBC: 4.4 10*3/uL (ref 3.4–10.8)

## 2017-01-15 LAB — HIV ANTIBODY (ROUTINE TESTING W REFLEX): HIV Screen 4th Generation wRfx: NONREACTIVE

## 2017-01-15 LAB — HEPATITIS C ANTIBODY: Hep C Virus Ab: <0.1 s/co ratio (ref 0.0–0.9)

## 2017-01-15 LAB — TSH: TSH: 1.2 u[IU]/mL (ref 0.450–4.500)

## 2017-01-16 ENCOUNTER — Encounter: Payer: Self-pay | Admitting: Family Medicine

## 2017-01-19 ENCOUNTER — Telehealth: Payer: Self-pay

## 2017-01-19 MED ORDER — SERTRALINE HCL 100 MG PO TABS: 100.0000 mg | ORAL_TABLET | Freq: Every day | ORAL | 1 refills | Status: DC

## 2017-01-23 MED ORDER — SERTRALINE HCL 100 MG PO TABS: 100.0000 mg | ORAL_TABLET | Freq: Every day | ORAL | 1 refills | Status: DC

## 2017-01-23 NOTE — Telephone Encounter (Signed)
Zoloft refill approved.

## 2017-01-27 ENCOUNTER — Telehealth: Payer: Self-pay | Admitting: Family Medicine

## 2017-01-27 MED ORDER — PRAVASTATIN SODIUM 40 MG PO TABS: 40.0000 mg | ORAL_TABLET | Freq: Every day | ORAL | 1 refills | Status: DC

## 2017-01-27 NOTE — Telephone Encounter (Signed)
PATIENT CALLING BECAUSE SHE STATES THAT DR Katrinka BlazingSMITH SUPPOSE TO DO A REFERRAL FOR A MAMMOGRAM AND INCREASE HER PRAVASTATIN BUT NEVER SENT A NEW PRESCRIPTION INTO PHARMACY

## 2017-01-27 NOTE — Telephone Encounter (Signed)
1.  I sent in Pravastatin 40mg  one tablet daily. Please advise patient.  2.  I placed referral for mammogram on 01/14/17; please check with [facility] regarding status of referral.

## 2017-01-28 NOTE — Telephone Encounter (Signed)
[facility] did try calling the pt and documented that a man answered and hung up when they asked for his wife on 01/19/17. I tried calling the pt on her cell number and had to leave a message to call me back. Once the pt calls back, I [facility] [phone] to set her appt up. Thanks!

## 2017-01-29 ENCOUNTER — Other Ambulatory Visit: Payer: Self-pay | Admitting: Family Medicine

## 2017-01-29 DIAGNOSIS — Z1231 Encounter for screening mammogram for malignant neoplasm of breast: Secondary | ICD-10-CM

## 2017-03-01 ENCOUNTER — Other Ambulatory Visit: Payer: Self-pay | Admitting: Family Medicine

## 2017-03-06 ENCOUNTER — Ambulatory Visit: Payer: BLUE CROSS/BLUE SHIELD

## 2017-03-21 NOTE — Progress Notes (Signed)
Letter sent.

## 2017-04-01 ENCOUNTER — Ambulatory Visit
Admission: RE | Admit: 2017-04-01 | Discharge: 2017-04-01 | Disposition: A | Discharge status: Home / Self Care | Payer: BLUE CROSS/BLUE SHIELD | Source: Ambulatory Visit | Attending: Family Medicine | Admitting: Family Medicine

## 2017-04-01 DIAGNOSIS — Z1231 Encounter for screening mammogram for malignant neoplasm of breast: Secondary | ICD-10-CM

## 2017-04-15 ENCOUNTER — Ambulatory Visit: Payer: BLUE CROSS/BLUE SHIELD | Admitting: Family Medicine

## 2017-05-03 ENCOUNTER — Other Ambulatory Visit: Payer: Self-pay | Admitting: Family Medicine

## 2017-06-09 ENCOUNTER — Other Ambulatory Visit: Payer: Self-pay | Admitting: Family Medicine

## 2017-06-10 NOTE — Telephone Encounter (Signed)
Fioricet refill request  LOV 01/14/17  Dr. Nigel BertholdSmith  [facility] [address] - [place], [place]  [id number]

## 2017-06-16 ENCOUNTER — Telehealth: Payer: Self-pay | Admitting: Family Medicine

## 2017-06-16 NOTE — Telephone Encounter (Signed)
°  Called pt- left VM - to remind them of their appt tomorrow. Advised them of the time, building number, early arrival and late policy. °

## 2017-06-17 ENCOUNTER — Encounter: Payer: Self-pay | Admitting: Family Medicine

## 2017-06-17 ENCOUNTER — Ambulatory Visit (INDEPENDENT_AMBULATORY_CARE_PROVIDER_SITE_OTHER): Payer: Medicare HMO | Admitting: Family Medicine

## 2017-06-17 VITALS — BP 122/84 | HR 72 | Temp 98.8°F | Resp 16 | Ht 62.6 in | Wt 141.6 lb

## 2017-06-17 DIAGNOSIS — N029 Recurrent and persistent hematuria with unspecified morphologic changes: Secondary | ICD-10-CM | POA: Insufficient documentation

## 2017-06-17 DIAGNOSIS — I10 Essential (primary) hypertension: Secondary | ICD-10-CM | POA: Diagnosis not present

## 2017-06-17 DIAGNOSIS — G43709 Chronic migraine without aura, not intractable, without status migrainosus: Secondary | ICD-10-CM | POA: Diagnosis not present

## 2017-06-17 DIAGNOSIS — F329 Major depressive disorder, single episode, unspecified: Secondary | ICD-10-CM | POA: Diagnosis not present

## 2017-06-17 DIAGNOSIS — E7800 Pure hypercholesterolemia, unspecified: Secondary | ICD-10-CM

## 2017-06-17 DIAGNOSIS — F419 Anxiety disorder, unspecified: Secondary | ICD-10-CM | POA: Diagnosis not present

## 2017-06-17 DIAGNOSIS — F32A Depression, unspecified: Secondary | ICD-10-CM

## 2017-06-17 DIAGNOSIS — R3129 Other microscopic hematuria: Secondary | ICD-10-CM | POA: Diagnosis not present

## 2017-06-17 MED ORDER — SERTRALINE HCL 100 MG PO TABS: 150.0000 mg | ORAL_TABLET | Freq: Every day | ORAL | 1 refills | Status: DC

## 2017-06-17 MED ORDER — SUMATRIPTAN SUCCINATE 100 MG PO TABS: ORAL_TABLET | ORAL | 3 refills | Status: DC

## 2017-06-17 MED ORDER — PRAVASTATIN SODIUM 40 MG PO TABS: 40.0000 mg | ORAL_TABLET | Freq: Every day | ORAL | 1 refills | Status: DC

## 2017-06-17 MED ORDER — METOPROLOL SUCCINATE ER 100 MG PO TB24: 100.0000 mg | ORAL_TABLET | Freq: Every day | ORAL | 1 refills | Status: DC

## 2017-06-17 NOTE — Patient Instructions (Addendum)
   IF you received an x-ray today, you will receive an invoice from [facility]. Please contact [facility] at [phone] with questions or concerns regarding your invoice.   IF you received labwork today, you will receive an invoice from [facility]. Please contact [facility] at [phone] with questions or concerns regarding your invoice.   Our billing staff will not be able to assist you with questions regarding bills from these companies.  You will be contacted with the lab results as soon as they are available. The fastest way to get your results is to activate your My Chart account. Instructions are located on the last page of this paperwork. If you have not heard from us regarding the results in 2 weeks, please contact this office.      Migraine Headache A migraine headache is an intense, throbbing pain on one side or both sides of the head. Migraines may also cause other symptoms, such as nausea, vomiting, and sensitivity to light and noise. What are the causes? Doing or taking certain things may also trigger migraines, such as:  Alcohol.  Smoking.  Medicines, such as: ? Medicine used to treat chest pain (nitroglycerine). ? Birth control pills. ? Estrogen pills. ? Certain blood pressure medicines.  Aged cheeses, chocolate, or caffeine.  Foods or drinks that contain nitrates, glutamate, aspartame, or tyramine.  Physical activity.  Other things that may trigger a migraine include:  Menstruation.  Pregnancy.  Hunger.  Stress, lack of sleep, too much sleep, or fatigue.  Weather changes.  What increases the risk? The following factors may make you more likely to experience migraine headaches:  Age. Risk increases with age.  Family history of migraine headaches.  Being Caucasian.  Depression and anxiety.  Obesity.  Being a woman.  Having a hole in the heart (patent foramen ovale) or other heart problems.  What are the signs or  symptoms? The main symptom of this condition is pulsating or throbbing pain. Pain may:  Happen in any area of the head, such as on one side or both sides.  Interfere with daily activities.  Get worse with physical activity.  Get worse with exposure to bright lights or loud noises.  Other symptoms may include:  Nausea.  Vomiting.  Dizziness.  General sensitivity to bright lights, loud noises, or smells.  Before you get a migraine, you may get warning signs that a migraine is developing (aura). An aura may include:  Seeing flashing lights or having blind spots.  Seeing bright spots, halos, or zigzag lines.  Having tunnel vision or blurred vision.  Having numbness or a tingling feeling.  Having trouble talking.  Having muscle weakness.  How is this diagnosed? A migraine headache can be diagnosed based on:  Your symptoms.  A physical exam.  Tests, such as CT scan or MRI of the head. These imaging tests can help rule out other causes of headaches.  Taking fluid from the spine (lumbar puncture) and analyzing it (cerebrospinal fluid analysis, or CSF analysis).  How is this treated? A migraine headache is usually treated with medicines that:  Relieve pain.  Relieve nausea.  Prevent migraines from coming back.  Treatment may also include:  Acupuncture.  Lifestyle changes like avoiding foods that trigger migraines.  Follow these instructions at home: Medicines  Take over-the-counter and prescription medicines only as told by your health care provider.  Do not drive or use heavy machinery while taking prescription pain medicine.  To prevent or treat constipation while you   are taking prescription pain medicine, your health care provider may recommend that you: ? Drink enough fluid to keep your urine clear or pale yellow. ? Take over-the-counter or prescription medicines. ? Eat foods that are high in fiber, such as fresh fruits and vegetables, whole grains,  and beans. ? Limit foods that are high in fat and processed sugars, such as fried and sweet foods. Lifestyle  Avoid alcohol use.  Do not use any products that contain nicotine or tobacco, such as cigarettes and e-cigarettes. If you need help quitting, ask your health care provider.  Get at least 8 hours of sleep every night.  Limit your stress. General instructions   Keep a journal to find out what may trigger your migraine headaches. For example, write down: ? What you eat and drink. ? How much sleep you get. ? Any change to your diet or medicines.  If you have a migraine: ? Avoid things that make your symptoms worse, such as bright lights. ? It may help to lie down in a dark, quiet room. ? Do not drive or use heavy machinery. ? Ask your health care provider what activities are safe for you while you are experiencing symptoms.  Keep all follow-up visits as told by your health care provider. This is important. Contact a health care provider if:  You develop symptoms that are different or more severe than your usual migraine symptoms. Get help right away if:  Your migraine becomes severe.  You have a fever.  You have a stiff neck.  You have vision loss.  Your muscles feel weak or like you cannot control them.  You start to lose your balance often.  You develop trouble walking.  You faint. This information is not intended to replace advice given to you by your health care provider. Make sure you discuss any questions you have with your health care provider. Document Released: 03/24/2005 Document Revised: 10/12/2015 Document Reviewed: 09/10/2015 Elsevier Interactive Patient Education  2017 [employer] Inc.  

## 2017-06-17 NOTE — Progress Notes (Signed)
Subjective:    Patient ID: Caroline [name], female    DOB: 09/23/1950, 67 y.o.   MRN: [id number]  06/17/2017  Hypertension (follow up ); Hyperlipidemia (follow up ); Depression (and anxiety follow up ); and Migraine (follow up )    HPI This 67 y.o. female presents for five month follow-up of hypertension, hypercholesterolemia, migraines, anxiety/depression. Husband going through cancer treatment; starting a new job.  Not happy with new job.   Migraines have increased since cancer treatment. Not as frequent.  BP at home some.  Couple of times elevate because headache.  154/84 with a headache.  130/70. Lowest BP 120/64.  Getting 1 per week; sometimes 2 per week.  Imitrex works; tries to take [employer] for milder ones. Severe headaches 4 this last month.    Microscopic hematuria: s/p CT kidney negative.  Intermittent. S/p urology.  S/p cystoscopy.    BP Readings from Last 3 Encounters:  06/17/17 122/84  01/14/17 138/80  01/03/17 138/88   Wt Readings from Last 3 Encounters:  06/17/17 141 lb 9.6 oz (64.2 kg)  01/14/17 137 lb (62.1 kg)  01/03/17 133 lb (60.3 kg)   Immunization History  Administered Date(s) Administered  . Influenza,inj,Quad PF,6+ Mos 01/03/2017  . Zoster 04/08/2015    Review of Systems  Constitutional: Negative for chills, diaphoresis, fatigue and fever.  Eyes: Negative for visual disturbance.  Respiratory: Negative for cough and shortness of breath.   Cardiovascular: Negative for chest pain, palpitations and leg swelling.  Gastrointestinal: Negative for abdominal pain, constipation, diarrhea, nausea and vomiting.  Endocrine: Negative for cold intolerance, heat intolerance, polydipsia, polyphagia and polyuria.  Neurological: Positive for headaches. Negative for dizziness, tremors, seizures, syncope, facial asymmetry, speech difficulty, weakness, light-headedness and numbness.  Psychiatric/Behavioral: Positive for dysphoric mood. Negative for self-injury, sleep  disturbance and suicidal ideas. The patient is nervous/anxious.     Past Medical History:  Diagnosis Date  . Allergy    Zyrtec, Benadryl  . Anxiety    Zoloft since several years  . Cataract   . Hyperlipidemia   . Hypertension   . Migraines    topmax and Imitrex   Past Surgical History:  Procedure Laterality Date  . ABDOMINAL HYSTERECTOMY     uterine prolapse; ovaries INTACT  . BLADDER REPAIR    . BREAST BIOPSY    . BREAST EXCISIONAL BIOPSY    . BREAST SURGERY     breast bx x 2 in 1980s; benign   Allergies  Allergen Reactions  . Tetracyclines & Related Nausea And Vomiting  . Vicodin [Hydrocodone-Acetaminophen] Nausea And Vomiting   Current Outpatient Medications on File Prior to Visit  Medication Sig Dispense Refill  . butalbital-acetaminophen-caffeine (FIORICET, ESGIC) 50-325-40 MG tablet TAKE 1 TO 2 TABLETS BY MOUTH EVERY 6 HOURS AS NEEDED FOR HEADACHE 60 tablet 0  . promethazine (PHENERGAN) 25 MG tablet Take 1 tablet (25 mg total) by mouth every 6 (six) hours as needed for nausea or vomiting. 10 tablet 0   No current facility-administered medications on file prior to visit.    Social History   Socioeconomic History  . Marital status: Married    Spouse name: Not on file  . Number of children: 2  . Years of education: Not on file  . Highest education level: Not on file  Occupational History  . Occupation: employed  Engineer, productionocial Needs  . Financial resource strain: Not on file  . Food insecurity:    Worry: Not on file    Inability: Not on file  .  Transportation needs:    Medical: Not on file    Non-medical: Not on file  Tobacco Use  . Smoking status: Never Smoker  . Smokeless tobacco: Never Used  Substance and Sexual Activity  . Alcohol use: Yes    Comment: rarely  . Drug use: No  . Sexual activity: Yes    Birth control/protection: Post-menopausal, Surgical  Lifestyle  . Physical activity:    Days per week: Not on file    Minutes per session: Not on file  .  Stress: Not on file  Relationships  . Social connections:    Talks on phone: Not on file    Gets together: Not on file    Attends religious service: Not on file    Active member of club or organization: Not on file    Attends meetings of clubs or organizations: Not on file    Relationship status: Not on file  . Intimate partner violence:    Fear of current or ex partner: Not on file    Emotionally abused: Not on file    Physically abused: Not on file    Forced sexual activity: Not on file  Other Topics Concern  . 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      Alcohol: rare      Exercise: none   Family History  Problem Relation Age of Onset  . Dementia Mother   . Cancer Father 61       oral cancer  . Heart disease Father        PAD/femoral bypass  . Hypertension Father   . Hyperlipidemia Father   . Alcohol abuse Father   . Hyperlipidemia Sister   . Hypertension Sister   . Breast cancer Maternal Aunt        Objective:    BP 122/84   Pulse 72   Temp 98.8 F (37.1 C)   Resp 16   Ht 5' 2.6" (1.59 m)   Wt 141 lb 9.6 oz (64.2 kg)   SpO2 98%   BMI 25.40 kg/m  Physical Exam  Constitutional: She is oriented to person, place, and time. She appears well-developed and well-nourished. No distress.  HENT:  Head: Normocephalic and atraumatic.  Right Ear: External ear normal.  Left Ear: External ear normal.  Nose: Nose normal.  Mouth/Throat: Oropharynx is clear and moist.  Eyes: Conjunctivae and EOM are normal. Pupils are equal, round, and reactive to light.  Neck: Normal range of motion. Neck supple. Carotid bruit is not present. No thyromegaly present.  Cardiovascular: Normal rate, regular rhythm, normal heart sounds and intact  distal pulses. Exam reveals no gallop and no friction rub.  No murmur heard. Pulmonary/Chest: Effort normal and breath sounds normal. She has no wheezes. She has no rales.  Abdominal: Soft. Bowel sounds are normal. She exhibits no distension and no mass. There is no tenderness. There is no rebound and no guarding.  Lymphadenopathy:    She has no cervical adenopathy.  Neurological: She is alert and oriented to person, place, and time. No cranial nerve deficit.  Skin: Skin is warm and dry. No rash noted. She is not diaphoretic. No erythema. No pallor.  Psychiatric: She  has a normal mood and affect. Her behavior is normal.   No results found. Depression screen [facility] 2/9 01/14/2017 01/03/2017 07/12/2016  Decreased Interest 0 0 0  Down, Depressed, Hopeless 0 0 0  PHQ - 2 Score 0 0 0   Fall Risk  01/14/2017 01/03/2017 07/12/2016  Falls in the past year? No No No        Assessment & Plan:   1. Pure hypercholesterolemia   2. Chronic migraine without aura without status migrainosus, not intractable   3. Anxiety and depression   4. Essential hypertension, benign   5. Microscopic hematuria   6. Benign hematuria     Migraines worsening due to worsening personal stressors; increase Zoloft to 150mg  daily.   HTN: moderate control; continue Metoprolol. If persistently elevated, will need to add a second agent. Obtain labs. Microscopic hematuria: chronic; patient s/p urology consultation in the past.    Orders Placed This Encounter  Procedures  . CBC with Differential/Platelet  . Comprehensive metabolic panel    Order Specific Question:   Has the patient fasted?    Answer:   No  . Lipid panel    Order Specific Question:   Has the patient fasted?    Answer:   No  . Urinalysis, dipstick only  . Urine Microscopic  . Urine Microscopic   Meds ordered this encounter  Medications  . DISCONTD: sertraline (ZOLOFT) 100 MG tablet    Sig: Take 1.5 tablets (150 mg total) by mouth daily.    Dispense:   135 tablet    Refill:  1  . SUMAtriptan (IMITREX) 100 MG tablet    Sig: TAKE 1 TABLET BY MOUTH ONCE DAILY MAY  REPEAT  IN  2  HOURS  IF  HEADACHE  PERSISTS    Dispense:  27 tablet    Refill:  3    Please consider 90 day supplies to promote better adherence  . sertraline (ZOLOFT) 100 MG tablet    Sig: Take 1.5 tablets (150 mg total) by mouth daily.    Dispense:  135 tablet    Refill:  1  . pravastatin (PRAVACHOL) 40 MG tablet    Sig: Take 1 tablet (40 mg total) by mouth daily.    Dispense:  90 tablet    Refill:  1  . metoprolol succinate (TOPROL-XL) 100 MG 24 hr tablet    Sig: Take 1 tablet (100 mg total) by mouth daily. Take with or immediately following a meal.    Dispense:  90 tablet    Refill:  1    Please consider 90 day supplies to promote better adherence    Return in about 4 months (around 10/17/2017) for follow-up chronic medical conditions.   Isamu Trammel Paulita Fujita, M.D. Primary Care at [facility] previously [facility] [address] [place], [place]  [address] [phone] phone [phone] fax

## 2017-06-18 LAB — COMPREHENSIVE METABOLIC PANEL
A/G RATIO: 1.9 (ref 1.2–2.2)
ALBUMIN: 4.6 g/dL (ref 3.6–4.8)
ALK PHOS: 110 IU/L (ref 39–117)
ALT: 13 IU/L (ref 0–32)
AST: 18 IU/L (ref 0–40)
BILIRUBIN TOTAL: 0.2 mg/dL (ref 0.0–1.2)
BUN / CREAT RATIO: 25 (ref 12–28)
BUN: 16 mg/dL (ref 8–27)
CO2: 22 mmol/L (ref 20–29)
CREATININE: 0.65 mg/dL (ref 0.57–1.00)
Calcium: 9.3 mg/dL (ref 8.7–10.3)
Chloride: 104 mmol/L (ref 96–106)
GFR calc Af Amer: 108 mL/min/{1.73_m2} (ref >59)
GFR calc non Af Amer: 93 mL/min/{1.73_m2} (ref >59)
GLOBULIN, TOTAL: 2.4 g/dL (ref 1.5–4.5)
Glucose: 92 mg/dL (ref 65–99)
POTASSIUM: 4.3 mmol/L (ref 3.5–5.2)
SODIUM: 142 mmol/L (ref 134–144)
Total Protein: 7 g/dL (ref 6.0–8.5)

## 2017-06-18 LAB — URINALYSIS, MICROSCOPIC ONLY: Casts: NONE SEEN /lpf

## 2017-06-18 LAB — URINALYSIS, DIPSTICK ONLY
BILIRUBIN UA: NEGATIVE
GLUCOSE, UA: NEGATIVE
KETONES UA: NEGATIVE
Nitrite, UA: NEGATIVE
Protein, UA: NEGATIVE
Specific Gravity, UA: >=1.03 — AB (ref 1.005–1.030)
UUROB: 0.2 mg/dL (ref 0.2–1.0)
pH, UA: 5 (ref 5.0–7.5)

## 2017-06-18 LAB — LIPID PANEL
CHOLESTEROL TOTAL: 183 mg/dL (ref 100–199)
Chol/HDL Ratio: 3.1 ratio (ref 0.0–4.4)
HDL: 59 mg/dL (ref >39)
LDL CALC: 103 mg/dL — AB (ref 0–99)
TRIGLYCERIDES: 104 mg/dL (ref 0–149)
VLDL Cholesterol Cal: 21 mg/dL (ref 5–40)

## 2017-06-18 LAB — CBC WITH DIFFERENTIAL/PLATELET
BASOS: 1 %
Basophils Absolute: 0 10*3/uL (ref 0.0–0.2)
EOS (ABSOLUTE): 0.2 10*3/uL (ref 0.0–0.4)
EOS: 3 %
HEMATOCRIT: 38.2 % (ref 34.0–46.6)
HEMOGLOBIN: 12.7 g/dL (ref 11.1–15.9)
Immature Grans (Abs): 0 10*3/uL (ref 0.0–0.1)
Immature Granulocytes: 0 %
LYMPHS ABS: 1.5 10*3/uL (ref 0.7–3.1)
Lymphs: 28 %
MCH: 31.5 pg (ref 26.6–33.0)
MCHC: 33.2 g/dL (ref 31.5–35.7)
MCV: 95 fL (ref 79–97)
MONOCYTES: 5 %
Monocytes Absolute: 0.3 10*3/uL (ref 0.1–0.9)
NEUTROS ABS: 3.3 10*3/uL (ref 1.4–7.0)
Neutrophils: 63 %
Platelets: 268 10*3/uL (ref 150–379)
RBC: 4.03 x10E6/uL (ref 3.77–5.28)
RDW: 14.1 % (ref 12.3–15.4)
WBC: 5.2 10*3/uL (ref 3.4–10.8)

## 2017-09-01 ENCOUNTER — Encounter: Payer: Self-pay | Admitting: Family Medicine

## 2017-10-02 NOTE — Progress Notes (Deleted)
Subjective:    Patient ID: Caroline [name], female    DOB: 09/26/1950, 67 y.o.   MRN: [id number]  10/07/2017  No chief complaint on file.    HPI This 67 y.o. female presents for evaluation of ***. BP Readings from Last 3 Encounters:  06/17/17 122/84  01/14/17 138/80  01/03/17 138/88   Wt Readings from Last 3 Encounters:  06/17/17 141 lb 9.6 oz (64.2 kg)  01/14/17 137 lb (62.1 kg)  01/03/17 133 lb (60.3 kg)   Immunization History  Administered Date(s) Administered  . Influenza,inj,Quad PF,6+ Mos 01/03/2017  . Zoster 04/08/2015    Review of Systems  Past Medical History:  Diagnosis Date  . Allergy    Zyrtec, Benadryl  . Anxiety    Zoloft since several years  . Cataract   . Hyperlipidemia   . Hypertension   . Migraines    topmax and Imitrex   Past Surgical History:  Procedure Laterality Date  . ABDOMINAL HYSTERECTOMY     uterine prolapse; ovaries INTACT  . BLADDER REPAIR    . BREAST BIOPSY    . BREAST EXCISIONAL BIOPSY    . BREAST SURGERY     breast bx x 2 in 1980s; benign   Allergies  Allergen Reactions  . Tetracyclines & Related Nausea And Vomiting  . Vicodin [Hydrocodone-Acetaminophen] Nausea And Vomiting   Current Outpatient Medications on File Prior to Visit  Medication Sig Dispense Refill  . butalbital-acetaminophen-caffeine (FIORICET, ESGIC) 50-325-40 MG tablet TAKE 1 TO 2 TABLETS BY MOUTH EVERY 6 HOURS AS NEEDED FOR HEADACHE 60 tablet 0  . metoprolol succinate (TOPROL-XL) 100 MG 24 hr tablet Take 1 tablet (100 mg total) by mouth daily. Take with or immediately following a meal. 90 tablet 1  . pravastatin (PRAVACHOL) 40 MG tablet Take 1 tablet (40 mg total) by mouth daily. 90 tablet 1  . promethazine (PHENERGAN) 25 MG tablet Take 1 tablet (25 mg total) by mouth every 6 (six) hours as needed for nausea or vomiting. 10 tablet 0  . sertraline (ZOLOFT) 100 MG tablet Take 1.5 tablets (150 mg total) by mouth daily. 135 tablet 1  . SUMAtriptan (IMITREX) 100 MG  tablet TAKE 1 TABLET BY MOUTH ONCE DAILY MAY  REPEAT  IN  2  HOURS  IF  HEADACHE  PERSISTS 27 tablet 3   No current facility-administered medications on file prior to visit.    Social History   Socioeconomic History  . Marital status: Married    Spouse name: Not on file  . Number of children: 2  . Years of education: Not on file  . Highest education level: Not on file  Occupational History  . Occupation: employed  Engineer, productionocial Needs  . Financial resource strain: Not on file  . Food insecurity:    Worry: Not on file    Inability: Not on file  . Transportation needs:    Medical: Not on file    Non-medical: Not on file  Tobacco Use  . Smoking status: Never Smoker  . Smokeless tobacco: Never Used  Substance and Sexual Activity  . Alcohol use: Yes    Comment: rarely  . Drug use: No  . Sexual activity: Yes    Birth control/protection: Post-menopausal, Surgical  Lifestyle  . Physical activity:    Days per week: Not on file    Minutes per session: Not on file  . Stress: Not on file  Relationships  . Social connections:    Talks on phone: Not on file  Gets together: Not on file    Attends religious service: Not on file    Active member of club or organization: Not on file    Attends meetings of clubs or organizations: Not on file    Relationship status: Not on file  . Intimate partner violence:    Fear of current or ex partner: Not on file    Emotionally abused: Not on file    Physically abused: Not on file    Forced sexual activity: Not on file  Other Topics Concern  . 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      Alcohol: rare      Exercise: none   Family History  Problem Relation Age of Onset  . Dementia Mother   . Cancer  Father 87       oral cancer  . Heart disease Father        PAD/femoral bypass  . Hypertension Father   . Hyperlipidemia Father   . Alcohol abuse Father   . Hyperlipidemia Sister   . Hypertension Sister   . Breast cancer Maternal Aunt        Objective:    There were no vitals taken for this visit. Physical Exam No results found. Depression screen [facility] 2/9 01/14/2017 01/03/2017 07/12/2016  Decreased Interest 0 0 0  Down, Depressed, Hopeless 0 0 0  PHQ - 2 Score 0 0 0   Fall Risk  01/14/2017 01/03/2017 07/12/2016  Falls in the past year? No No No        Assessment & Plan:   1. Chronic migraine without aura without status migrainosus, not intractable   2. Anxiety and depression   3. Pure hypercholesterolemia     ***  No orders of the defined types were placed in this encounter.  No orders of the defined types were placed in this encounter.   No follow-ups on file.   Kristi Paulita Fujita, M.D. Primary Care at [facility] previously [facility] [address] [place], [place]  [address] [phone] phone [phone] fax

## 2017-10-07 ENCOUNTER — Ambulatory Visit: Payer: Medicare HMO | Admitting: Family Medicine

## 2017-10-12 ENCOUNTER — Ambulatory Visit: Payer: Medicare HMO | Admitting: Family Medicine

## 2017-10-21 ENCOUNTER — Other Ambulatory Visit: Payer: Self-pay

## 2017-10-21 ENCOUNTER — Encounter: Payer: Self-pay | Admitting: Family Medicine

## 2017-10-21 ENCOUNTER — Ambulatory Visit (INDEPENDENT_AMBULATORY_CARE_PROVIDER_SITE_OTHER): Payer: Medicare HMO | Admitting: Family Medicine

## 2017-10-21 VITALS — BP 122/72 | HR 73 | Temp 99.0°F | Resp 16 | Ht 61.81 in | Wt 143.0 lb

## 2017-10-21 DIAGNOSIS — F329 Major depressive disorder, single episode, unspecified: Secondary | ICD-10-CM

## 2017-10-21 DIAGNOSIS — F419 Anxiety disorder, unspecified: Secondary | ICD-10-CM | POA: Diagnosis not present

## 2017-10-21 DIAGNOSIS — G43709 Chronic migraine without aura, not intractable, without status migrainosus: Secondary | ICD-10-CM | POA: Diagnosis not present

## 2017-10-21 DIAGNOSIS — E2839 Other primary ovarian failure: Secondary | ICD-10-CM

## 2017-10-21 DIAGNOSIS — E7800 Pure hypercholesterolemia, unspecified: Secondary | ICD-10-CM | POA: Diagnosis not present

## 2017-10-21 DIAGNOSIS — Z23 Encounter for immunization: Secondary | ICD-10-CM | POA: Diagnosis not present

## 2017-10-21 DIAGNOSIS — R232 Flushing: Secondary | ICD-10-CM

## 2017-10-21 DIAGNOSIS — I10 Essential (primary) hypertension: Secondary | ICD-10-CM | POA: Diagnosis not present

## 2017-10-21 MED ORDER — AMITRIPTYLINE HCL 10 MG PO TABS: 10.0000 mg | ORAL_TABLET | Freq: Every day | ORAL | 1 refills | Status: DC

## 2017-10-21 NOTE — Patient Instructions (Addendum)
Recommend Miralax, Metamucil, or Citrucel for gas, constipation.   DECREASE SERTRALINE 100MG  TO ONE DAILY. ADD AMITRIPTYLINE 10-20MG  AT BEDTIME FOR MIGRAINE PREVENTION, IMPROVED SLEEP. EXERCISE, EXERCISE, EXERCISE!   We recommend that you schedule a BONE DENSITY SCAN. Typically, you do not need a referral to do this. Please contact a local imaging center to schedule your mammogram.  [facility] - [phone]  *ask for the Radiology Department [facility]) - [phone]) [phone] or [phone]) [phone]  [facility] - [phone]) [phone] [facility] - [phone] [facility] - [phone](336) [phone]  *ask for the Radiology Department [facility] - [phone]) [phone]  *ask for the Radiology Department [facility] - [phone]  *ask for the Mammography Department [facility] - [phone]) [phone]  IF you received an x-ray today, you will receive an invoice from [facility]. Please contact [facility] at [phone] with questions or concerns regarding your invoice.   IF you received labwork today, you will receive an invoice from [facility]. Please contact [facility] at [phone] with questions or concerns regarding your invoice.   Our billing staff will not be able to assist you with questions regarding bills from these companies.  You will be contacted with the lab results as soon as they are available. The fastest way to get your results is to activate your My Chart account. Instructions are located on the last page of this paperwork. If you have not heard from us regarding the results in 2 weeks, please contact this office.      Constipation, Adult Constipation is when a person has fewer bowel movements in a week than normal, has difficulty having a bowel movement, or has stools that are dry, hard, or larger than normal. Constipation may be caused by an underlying condition. It may become worse  with age if a person takes certain medicines and does not take in enough fluids. Follow these instructions at home: Eating and drinking   Eat foods that have a lot of fiber, such as fresh fruits and vegetables, whole grains, and beans.  Limit foods that are high in fat, low in fiber, or overly processed, such as french fries, hamburgers, cookies, candies, and soda.  Drink enough fluid to keep your urine clear or pale yellow. General instructions  Exercise regularly or as told by your health care provider.  Go to the restroom when you have the urge to go. Do not hold it in.  Take over-the-counter and prescription medicines only as told by your health care provider. These include any fiber supplements.  Practice pelvic floor retraining exercises, such as deep breathing while relaxing the lower abdomen and pelvic floor relaxation during bowel movements.  Watch your condition for any changes.  Keep all follow-up visits as told by your health care provider. This is important. Contact a health care provider if:  You have pain that gets worse.  You have a fever.  You do not have a bowel movement after 4 days.  You vomit.  You are not hungry.  You lose weight.  You are bleeding from the anus.  You have thin, pencil-like stools. Get help right away if:  You have a fever and your symptoms suddenly get worse.  You leak stool or have blood in your stool.  Your abdomen is bloated.  You have severe pain in your abdomen.  You feel dizzy or you faint. This information is not intended to replace advice given to you by  your health care provider. Make sure you discuss any questions you have with your health care provider. Document Released: 12/21/2003 Document Revised: 10/12/2015 Document Reviewed: 09/12/2015 Elsevier Interactive Patient Education  2018 [employer].

## 2017-10-21 NOTE — Progress Notes (Signed)
Subjective:    Patient ID: Caroline [name], female    DOB: April 10, 1950, 67 y.o.   MRN: [id number]  10/21/2017  Chronic Conditions (4 month follow-up pt states her hot flashes has come back x 1 month)    HPI This 67 y.o. female presents for four month follow-up of hypertension, hypercholesterolemia, migraines, anxiety and depression.  Management changes made at last visit include the following:  Migraines worsening due to worsening personal stressors; increase Zoloft to 150mg  daily.   HTN: moderate control; continue Metoprolol. If persistently elevated, will need to add a second agent. Obtain labs. Microscopic hematuria: chronic; patient s/p urology consultation in the past.   UPDATE: Huband doing well/better; stopped chemo; Mohamed started immunotherapy; scanning every 3 months.  Feels much better.  Husband reads a lot; vacuums. Cannot wait to retire; must work four more years; same issues; only there three months.  More hours than last drop.  Working 40 hours per week.  Prior 32-36 hours.   Hot flashes just started in past 2-3 months.  Hit hard; about die; when cools off, better. Not same type of hot.  Could feel from inside out.anytime.  Not stress related.   Not sure if feeling any better. Home Bp 120s-130/80s. Migraines six per month.  Much better than it was.  More stress related.  If really stress related.  Imitrex works; never needs a second one.  No missed work.  Tolerates Imitrex; rare fatigue.   Does not sleep well.   Really gassy; constipated; chronic.  Last colonoscopy less than 10 years.  No abdominal pain.  No gerd, indigestion.  Bowel movement three times per week.  No   BP Readings from Last 3 Encounters:  10/21/17 122/72  06/17/17 122/84  01/14/17 138/80   Wt Readings from Last 3 Encounters:  10/21/17 143 lb (64.9 kg)  06/17/17 141 lb 9.6 oz (64.2 kg)  01/14/17 137 lb (62.1 kg)   Immunization History  Administered Date(s) Administered  . Influenza,inj,Quad PF,6+  Mos 01/03/2017  . Pneumococcal Conjugate-13 10/21/2017  . Zoster 04/08/2015    Review of Systems  Constitutional: Negative for activity change, appetite change, chills, diaphoresis, fatigue, fever and unexpected weight change.  HENT: Negative for congestion, dental problem, drooling, ear discharge, ear pain, facial swelling, hearing loss, mouth sores, nosebleeds, postnasal drip, rhinorrhea, sinus pressure, sinus pain, sneezing, sore throat, tinnitus, trouble swallowing and voice change.   Eyes: Negative for photophobia, pain, discharge, redness, itching and visual disturbance.  Respiratory: Negative for apnea, cough, choking, chest tightness, shortness of breath, wheezing and stridor.   Cardiovascular: Negative for chest pain, palpitations and leg swelling.  Gastrointestinal: Positive for abdominal distention. Negative for abdominal pain, anal bleeding, blood in stool, constipation, diarrhea, nausea, rectal pain and vomiting.  Endocrine: Negative for cold intolerance, heat intolerance, polydipsia, polyphagia and polyuria.  Genitourinary: Negative for decreased urine volume, difficulty urinating, dyspareunia, dysuria, enuresis, flank pain, frequency, genital sores, hematuria, menstrual problem, pelvic pain, urgency, vaginal bleeding, vaginal discharge and vaginal pain.  Musculoskeletal: Negative for arthralgias, back pain, gait problem, joint swelling, myalgias, neck pain and neck stiffness.  Skin: Negative for color change, pallor, rash and wound.  Allergic/Immunologic: Negative for environmental allergies, food allergies and immunocompromised state.  Neurological: Positive for headaches. Negative for dizziness, tremors, seizures, syncope, facial asymmetry, speech difficulty, weakness, light-headedness and numbness.  Hematological: Negative for adenopathy. Does not bruise/bleed easily.  Psychiatric/Behavioral: Positive for dysphoric mood and sleep disturbance. Negative for agitation, behavioral  problems, confusion, decreased concentration, hallucinations, self-injury  and suicidal ideas. The patient is nervous/anxious. The patient is not hyperactive.     Past Medical History:  Diagnosis Date  . Allergy    Zyrtec, Benadryl  . Anxiety    Zoloft since several years  . Cataract   . Hyperlipidemia   . Hypertension   . Migraines    topmax and Imitrex   Past Surgical History:  Procedure Laterality Date  . ABDOMINAL HYSTERECTOMY     uterine prolapse; ovaries INTACT  . BLADDER REPAIR    . BREAST BIOPSY    . BREAST EXCISIONAL BIOPSY    . BREAST SURGERY     breast bx x 2 in 1980s; benign   Allergies  Allergen Reactions  . Tetracyclines & Related Nausea And Vomiting  . Vicodin [Hydrocodone-Acetaminophen] Nausea And Vomiting   Current Outpatient Medications on File Prior to Visit  Medication Sig Dispense Refill  . butalbital-acetaminophen-caffeine (FIORICET, ESGIC) 50-325-40 MG tablet TAKE 1 TO 2 TABLETS BY MOUTH EVERY 6 HOURS AS NEEDED FOR HEADACHE 60 tablet 0  . metoprolol succinate (TOPROL-XL) 100 MG 24 hr tablet Take 1 tablet (100 mg total) by mouth daily. Take with or immediately following a meal. 90 tablet 1  . pravastatin (PRAVACHOL) 40 MG tablet Take 1 tablet (40 mg total) by mouth daily. 90 tablet 1  . promethazine (PHENERGAN) 25 MG tablet Take 1 tablet (25 mg total) by mouth every 6 (six) hours as needed for nausea or vomiting. 10 tablet 0  . sertraline (ZOLOFT) 100 MG tablet Take 1.5 tablets (150 mg total) by mouth daily. 135 tablet 1  . SUMAtriptan (IMITREX) 100 MG tablet TAKE 1 TABLET BY MOUTH ONCE DAILY MAY  REPEAT  IN  2  HOURS  IF  HEADACHE  PERSISTS 27 tablet 3   No current facility-administered medications on file prior to visit.    Social History   Socioeconomic History  . Marital status: Married    Spouse name: Not on file  . Number of children: 2  . Years of education: Not on file  . Highest education level: Not on file  Occupational History  .  Occupation: employed  Engineer, production  . Financial resource strain: Not on file  . Food insecurity:    Worry: Not on file    Inability: Not on file  . Transportation needs:    Medical: Not on file    Non-medical: Not on file  Tobacco Use  . Smoking status: Never Smoker  . Smokeless tobacco: Never Used  Substance and Sexual Activity  . Alcohol use: Yes    Comment: rarely  . Drug use: No  . Sexual activity: Yes    Birth control/protection: Post-menopausal, Surgical  Lifestyle  . Physical activity:    Days per week: Not on file    Minutes per session: Not on file  . Stress: Not on file  Relationships  . Social connections:    Talks on phone: Not on file    Gets together: Not on file    Attends religious service: Not on file    Active member of club or organization: Not on file    Attends meetings of clubs or organizations: Not on file    Relationship status: Not on file  . Intimate partner violence:    Fear of current or ex partner: Not on file    Emotionally abused: Not on file    Physically abused: Not on file    Forced sexual activity: Not on file  Other Topics  Concern  . 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      Alcohol: rare      Exercise: none   Family History  Problem Relation Age of Onset  . Dementia Mother   . Cancer Father 49       oral cancer  . Heart disease Father        PAD/femoral bypass  . Hypertension Father   . Hyperlipidemia Father   . Alcohol abuse Father   . Hyperlipidemia Sister   . Hypertension Sister   . Breast cancer Maternal Aunt        Objective:    BP 122/72   Pulse 73   Temp 99 F (37.2 C) (Oral)   Resp 16   Ht 5' 1.81" (1.57 m)   Wt 143 lb (64.9 kg)   SpO2 97%   BMI 26.32  kg/m  Physical Exam  Constitutional: She is oriented to person, place, and time. She appears well-developed and well-nourished. No distress.  HENT:  Head: Normocephalic and atraumatic.  Right Ear: External ear normal.  Left Ear: External ear normal.  Nose: Nose normal.  Mouth/Throat: Oropharynx is clear and moist.  Eyes: Pupils are equal, round, and reactive to light. Conjunctivae and EOM are normal.  Neck: Normal range of motion and full passive range of motion without pain. Neck supple. No JVD present. Carotid bruit is not present. No thyromegaly present.  Cardiovascular: Normal rate, regular rhythm and normal heart sounds. Exam reveals no gallop and no friction rub.  No murmur heard. Pulmonary/Chest: Effort normal and breath sounds normal. She has no wheezes. She has no rales.  Abdominal: Soft. Bowel sounds are normal. She exhibits no distension and no mass. There is no tenderness. There is no rebound and no guarding.  Musculoskeletal:       Right shoulder: Normal.       Left shoulder: Normal.       Cervical back: Normal.  Lymphadenopathy:    She has no cervical adenopathy.  Neurological: She is alert and oriented to person, place, and time. She has normal reflexes. No cranial nerve deficit. She exhibits normal muscle tone. Coordination normal.  Skin: Skin is warm and dry. No rash noted. She is not diaphoretic. No erythema. No pallor.  Psychiatric: She has a normal mood and affect. Her behavior is normal. Judgment and thought content normal.  Nursing note and vitals reviewed.  No results found. Depression screen [facility] 2/9 10/21/2017 01/14/2017 01/03/2017 07/12/2016  Decreased Interest 0 0 0 0  Down, Depressed, Hopeless 0 0 0 0  PHQ - 2 Score 0 0 0 0   Fall Risk  10/21/2017 01/14/2017 01/03/2017 07/12/2016  Falls in the past year? No No No No        Assessment & Plan:   1. Chronic migraine without aura without status migrainosus, not intractable   2. Hot flashes   3. Anxiety and  depression   4. Essential hypertension, benign   5. Pure hypercholesterolemia   6. Estrogen deficiency   7. Need for prophylactic vaccination against Streptococcus pneumoniae (pneumococcus)     Hot flashes: New onset; etiology unclear yet not due to menopausal state.  Obtain labs  including TSH to rule out se increasedcondary causes.  Sertraline therapy at last visit to 150 mg without significant improvement in mood.  Thus, will decrease sertraline to 100 mg daily as it may be contributing to hot flashes.  Patient is also suffering with significant stressors at work and at home; thus, hot flashes may be stress related.  Recommend observation over the next 3 months to see if hot flashes are related to acute stressors.  Chronic migraines: Improved yet still suffering with 6 migraines on average per month.  Imitrex therapy works extremely well for patient.  Never requires a second Imitrex tablet.  Suffering with insomnia currently.  Initiate amitriptyline 10 mg 1 to 2 tablets at bedtime for migraine prevention as well as acute insomnia.  Anxiety with depression: Stable at this time.  Has been is doing better medically and no longer undergoing chemotherapy.  Has been maintained on immunotherapy by Dr. Shirline FreesMohammed of oncology and feeling better.  Decrease sertraline to 100 mg daily.  Add amitriptyline for insomnia.  Hypertension: Well-controlled at this time.  Continue metoprolol therapy at current dose.  Hypercholesterolemia: Patient is nonfasting today.  I recommend weight loss, exercise, and low-cholesterol low-fat food choices.  I recommend limiting red meat to once per week; I recommend limiting fried foods to once per month.  Estrogen deficiency: Obtain bone density scan as patient is 67 years old.  Recommend third 3 servings of dairy daily.  Recommend daily exercise for bone health.  Status post Prevnar 13.  Orders Placed This Encounter  Procedures  . DG Bone Density    Standing Status:    Future    Standing Expiration Date:   12/23/2018    Order Specific Question:   Reason for Exam (SYMPTOM  OR DIAGNOSIS REQUIRED)    Answer:   estrogen deficiency    Order Specific Question:   Preferred imaging [facility]?    [facility]  . Pneumococcal conjugate vaccine 13-valent IM  . CBC with Differential/Platelet  . Comprehensive metabolic panel  . TSH   Meds ordered this encounter  Medications  . amitriptyline (ELAVIL) 10 MG tablet    Sig: Take 1-2 tablets (10-20 mg total) by mouth at bedtime.    Dispense:  180 tablet    Refill:  1    Return in about 4 months (around 02/21/2018) for complete physical examiniation SANTIAGO.   Candela Krul Paulita FujitaMartin Javarius Tsosie, M.D. [facility]  [facility] [address] [place], [place]  [id number] [phone] phone [phone] fax

## 2017-10-22 ENCOUNTER — Encounter: Payer: Self-pay | Admitting: Family Medicine

## 2017-10-22 DIAGNOSIS — I10 Essential (primary) hypertension: Secondary | ICD-10-CM | POA: Insufficient documentation

## 2017-10-22 LAB — CBC WITH DIFFERENTIAL/PLATELET
BASOS: 1 %
Basophils Absolute: 0.1 10*3/uL (ref 0.0–0.2)
EOS (ABSOLUTE): 0.3 10*3/uL (ref 0.0–0.4)
EOS: 5 %
HEMOGLOBIN: 12.4 g/dL (ref 11.1–15.9)
Hematocrit: 37.7 % (ref 34.0–46.6)
Immature Grans (Abs): 0 10*3/uL (ref 0.0–0.1)
Immature Granulocytes: 0 %
LYMPHS ABS: 2.2 10*3/uL (ref 0.7–3.1)
Lymphs: 39 %
MCH: 30.6 pg (ref 26.6–33.0)
MCHC: 32.9 g/dL (ref 31.5–35.7)
MCV: 93 fL (ref 79–97)
MONOS ABS: 0.5 10*3/uL (ref 0.1–0.9)
Monocytes: 9 %
Neutrophils Absolute: 2.6 10*3/uL (ref 1.4–7.0)
Neutrophils: 46 %
PLATELETS: 285 10*3/uL (ref 150–450)
RBC: 4.05 x10E6/uL (ref 3.77–5.28)
RDW: 13 % (ref 12.3–15.4)
WBC: 5.6 10*3/uL (ref 3.4–10.8)

## 2017-10-22 LAB — COMPREHENSIVE METABOLIC PANEL
ALBUMIN: 4.8 g/dL (ref 3.6–4.8)
ALK PHOS: 91 IU/L (ref 39–117)
ALT: 16 IU/L (ref 0–32)
AST: 22 IU/L (ref 0–40)
Albumin/Globulin Ratio: 2.3 — ABNORMAL HIGH (ref 1.2–2.2)
BUN / CREAT RATIO: 33 — AB (ref 12–28)
BUN: 20 mg/dL (ref 8–27)
CHLORIDE: 101 mmol/L (ref 96–106)
CO2: 24 mmol/L (ref 20–29)
CREATININE: 0.6 mg/dL (ref 0.57–1.00)
Calcium: 9.4 mg/dL (ref 8.7–10.3)
GFR calc Af Amer: 111 mL/min/{1.73_m2} (ref >59)
GFR calc non Af Amer: 96 mL/min/{1.73_m2} (ref >59)
GLUCOSE: 98 mg/dL (ref 65–99)
Globulin, Total: 2.1 g/dL (ref 1.5–4.5)
Potassium: 5 mmol/L (ref 3.5–5.2)
Sodium: 141 mmol/L (ref 134–144)
Total Protein: 6.9 g/dL (ref 6.0–8.5)

## 2017-10-22 LAB — TSH: TSH: 1.29 u[IU]/mL (ref 0.450–4.500)

## 2017-10-28 ENCOUNTER — Telehealth: Payer: Self-pay | Admitting: Family Medicine

## 2017-10-28 NOTE — Telephone Encounter (Signed)
PA STARTED [id number]

## 2017-10-28 NOTE — Telephone Encounter (Signed)
Copied from CRM [id number]. Topic: Quick Communication - See Telephone Encounter >> Oct 28, 2017  9:17 AM Tamela OddiMartin, Don'Quashia, NT wrote: CRM for notification. See Telephone encounter for: 10/28/17. Patient called and states that the pharmacy sent over a prior auth to be done for the medication amitriptyline (ELAVIL) 10 MG tablet. Patient is calling to check the status and to see if that was done. CB# [phone]

## 2017-11-10 ENCOUNTER — Other Ambulatory Visit: Payer: Self-pay | Admitting: Family Medicine

## 2017-11-13 ENCOUNTER — Telehealth: Payer: Self-pay | Admitting: Family Medicine

## 2017-11-13 NOTE — Telephone Encounter (Signed)
Left a voicemail for Ms. Caroline [name] in regards to an appt she has on 01/22/2018 with Dr. Leretha PolSantiago. The provider will not be in the office on that day and would need to be rescheduled.

## 2017-11-19 ENCOUNTER — Telehealth: Payer: Self-pay | Admitting: Family Medicine

## 2017-11-19 NOTE — Telephone Encounter (Signed)
Left a voicemail in regards to her medication (Amitriptyline) was approved by her insurance company.

## 2017-12-22 ENCOUNTER — Telehealth: Payer: Self-pay | Admitting: Family Medicine

## 2017-12-22 NOTE — Telephone Encounter (Signed)
Called pt. To reschedule visit on 02/16/18 with Dr. Leretha PolSantiago. Left message with husband of pt. Regarding details of rescheduling.

## 2018-01-13 ENCOUNTER — Other Ambulatory Visit: Payer: Self-pay | Admitting: Family Medicine

## 2018-01-13 NOTE — Telephone Encounter (Signed)
Requested Prescriptions  Pending Prescriptions Disp Refills  . pravastatin (PRAVACHOL) 40 MG tablet [Pharmacy Med Name: PRAVASTATIN SODIUM 40 MG Tablet] 90 tablet 0    Sig: TAKE 1 TABLET EVERY DAY     Cardiovascular:  Antilipid - Statins Failed - 01/13/2018  1:27 AM      Failed - LDL in normal range and within 360 days    LDL Calculated  Date Value Ref Range Status  06/17/2017 103 (H) 0 - 99 mg/dL Final         Passed - Total Cholesterol in normal range and within 360 days    Cholesterol, Total  Date Value Ref Range Status  06/17/2017 183 100 - 199 mg/dL Final         Passed - HDL in normal range and within 360 days    HDL  Date Value Ref Range Status  06/17/2017 59 >39 mg/dL Final         Passed - Triglycerides in normal range and within 360 days    Triglycerides  Date Value Ref Range Status  06/17/2017 104 0 - 149 mg/dL Final         Passed - Patient is not pregnant      Passed - Valid encounter within last 12 months    Recent Outpatient Visits          2 months ago Chronic migraine without aura without status migrainosus, not intractable   Primary Care at [facility], Myrle Sheng, MD   7 months ago Pure hypercholesterolemia   Primary Care at [facility], Myrle Sheng, MD   12 months ago Routine physical examination   Primary Care at [facility], Myrle Sheng, MD   1 year ago Essential hypertension   Primary Care at [facility], Myrle Sheng, MD   1 year ago Pure hypercholesterolemia   Primary Care at [facility], Myrle Sheng, MD      Future Appointments            In 1 month Myles Lipps, MD Primary Care at [facility]         . metoprolol succinate (TOPROL-XL) 100 MG 24 hr tablet [Pharmacy Med Name: METOPROLOL SUCCINATE ER 100 MG Tablet Extended Release 24 Hour] 90 tablet 0    Sig: TAKE 1 TABLET EVERY DAY. TAKE WITH OR IMMEDIATELY FOLLOWING A MEAL.     Cardiovascular:  Beta Blockers Passed - 01/13/2018  1:27 AM      Passed - Last BP in normal range    BP Readings  from Last 1 Encounters:  10/21/17 122/72         Passed - Last Heart Rate in normal range    Pulse Readings from Last 1 Encounters:  10/21/17 73         Passed - Valid encounter within last 6 months    Recent Outpatient Visits          2 months ago Chronic migraine without aura without status migrainosus, not intractable   Primary Care at [facility] Myrle Sheng, MD   7 months ago Pure hypercholesterolemia   Primary Care at [facility], Myrle Sheng, MD   12 months ago Routine physical examination   Primary Care at [facility], Myrle Sheng, MD   1 year ago Essential hypertension   Primary Care at [facility], Myrle Sheng, MD   1 year ago Pure hypercholesterolemia   Primary Care at [facility], Myrle Sheng, MD      Future Appointments  In 1 month [place], Meda Coffee, MD [facility] at [facility]

## 2018-01-22 ENCOUNTER — Encounter: Payer: Medicare HMO | Admitting: Family Medicine

## 2018-02-16 ENCOUNTER — Encounter: Payer: Medicare HMO | Admitting: Family Medicine

## 2018-03-02 ENCOUNTER — Encounter: Payer: Self-pay | Admitting: Family Medicine

## 2018-03-02 ENCOUNTER — Other Ambulatory Visit: Payer: Self-pay

## 2018-03-02 ENCOUNTER — Ambulatory Visit (INDEPENDENT_AMBULATORY_CARE_PROVIDER_SITE_OTHER): Payer: Medicare HMO | Admitting: Family Medicine

## 2018-03-02 VITALS — BP 146/88 | HR 67 | Temp 97.6°F | Ht 61.81 in | Wt 148.2 lb

## 2018-03-02 DIAGNOSIS — E7800 Pure hypercholesterolemia, unspecified: Secondary | ICD-10-CM | POA: Diagnosis not present

## 2018-03-02 DIAGNOSIS — Z0000 Encounter for general adult medical examination without abnormal findings: Secondary | ICD-10-CM

## 2018-03-02 DIAGNOSIS — Z0001 Encounter for general adult medical examination with abnormal findings: Secondary | ICD-10-CM

## 2018-03-02 DIAGNOSIS — F419 Anxiety disorder, unspecified: Secondary | ICD-10-CM

## 2018-03-02 DIAGNOSIS — I10 Essential (primary) hypertension: Secondary | ICD-10-CM | POA: Diagnosis not present

## 2018-03-02 DIAGNOSIS — Z1231 Encounter for screening mammogram for malignant neoplasm of breast: Secondary | ICD-10-CM | POA: Diagnosis not present

## 2018-03-02 DIAGNOSIS — Z23 Encounter for immunization: Secondary | ICD-10-CM | POA: Diagnosis not present

## 2018-03-02 DIAGNOSIS — F32A Depression, unspecified: Secondary | ICD-10-CM

## 2018-03-02 DIAGNOSIS — F329 Major depressive disorder, single episode, unspecified: Secondary | ICD-10-CM | POA: Diagnosis not present

## 2018-03-02 DIAGNOSIS — Z1211 Encounter for screening for malignant neoplasm of colon: Secondary | ICD-10-CM

## 2018-03-02 MED ORDER — BUPROPION HCL ER (SR) 100 MG PO TB12: 100.0000 mg | ORAL_TABLET | Freq: Two times a day (BID) | ORAL | 2 refills | Status: DC

## 2018-03-02 NOTE — Patient Instructions (Addendum)
Preventive Care 101 Years and Older, Female Preventive care refers to lifestyle choices and visits with your health care provider that can promote health and wellness. What does preventive care include?  A yearly physical exam. This is also called an annual well check.  Dental exams once or twice a year.  Routine eye exams. Ask your health care provider how often you should have your eyes checked.  Personal lifestyle choices, including: ? Daily care of your teeth and gums. ? Regular physical activity. ? Eating a healthy diet. ? Avoiding tobacco and drug use. ? Limiting alcohol use. ? Practicing safe sex. ? Taking low-dose aspirin every day. ? Taking vitamin and mineral supplements as recommended by your health care provider. What happens during an annual well check? The services and screenings done by your health care provider during your annual well check will depend on your age, overall health, lifestyle risk factors, and family history of disease. Counseling Your health care provider may ask you questions about your:  Alcohol use.  Tobacco use.  Drug use.  Emotional well-being.  Home and relationship well-being.  Sexual activity.  Eating habits.  History of falls.  Memory and ability to understand (cognition).  Work and work Statistician.  Reproductive health.  Screening You may have the following tests or measurements:  Height, weight, and BMI.  Blood pressure.  Lipid and cholesterol levels. These may be checked every 5 years, or more frequently if you are over 59 years old.  Skin check.  Lung cancer screening. You may have this screening every year starting at age 65 if you have a 30-pack-year history of smoking and currently smoke or have quit within the past 15 years.  Fecal occult blood test (FOBT) of the stool. You may have this test every year starting at age 65.  Flexible sigmoidoscopy or colonoscopy. You may have a sigmoidoscopy every 5  years or a colonoscopy every 10 years starting at age 21.  Hepatitis C blood test.  Hepatitis B blood test.  Sexually transmitted disease (STD) testing.  Diabetes screening. This is done by checking your blood sugar (glucose) after you have not eaten for a while (fasting). You may have this done every 1-3 years.  Bone density scan. This is done to screen for osteoporosis. You may have this done starting at age 46.  Mammogram. This may be done every 1-2 years. Talk to your health care provider about how often you should have regular mammograms.  Talk with your health care provider about your test results, treatment options, and if necessary, the need for more tests. Vaccines Your health care provider may recommend certain vaccines, such as:  Influenza vaccine. This is recommended every year.  Tetanus, diphtheria, and acellular pertussis (Tdap, Td) vaccine. You may need a Td booster every 10 years.  Varicella vaccine. You may need this if you have not been vaccinated.  Zoster vaccine. You may need this after age 65.  Measles, mumps, and rubella (MMR) vaccine. You may need at least one dose of MMR if you were born in 1957 or later. You may also need a second dose.  Pneumococcal 13-valent conjugate (PCV13) vaccine. One dose is recommended after age 27.  Pneumococcal polysaccharide (PPSV23) vaccine. One dose is recommended after age 65.  Meningococcal vaccine. You may need this if you have certain conditions.  Hepatitis A vaccine. You may need this if you have certain conditions or if you travel or work in places where you may be exposed  to hepatitis A.  Hepatitis B vaccine. You may need this if you have certain conditions or if you travel or work in places where you may be exposed to hepatitis B.  Haemophilus influenzae type b (Hib) vaccine. You may need this if you have certain conditions.  Talk to your health care provider about which screenings and vaccines you need and how  often you need them. This information is not intended to replace advice given to you by your health care provider. Make sure you discuss any questions you have with your health care provider. Document Released: 04/20/2015 Document Revised: 12/12/2015 Document Reviewed: 01/23/2015 Elsevier Interactive Patient Education  [employer].   If you have lab work done today you will be contacted with your lab results within the next 2 weeks.  If you have not heard from [place] then please contact us. The fastest way to get your results is to register for My Chart.   IF you received an x-ray today, you will receive an invoice from [facility]. Please contact [facility] at [phone] with questions or concerns regarding your invoice.   IF you received labwork today, you will receive an invoice from [place]. Please contact [facility] at [phone] with questions or concerns regarding your invoice.   Our billing staff will not be able to assist you with questions regarding bills from these companies.  You will be contacted with the lab results as soon as they are available. The fastest way to get your results is to activate your My Chart account. Instructions are located on the last page of this paperwork. If you have not heard from [place] regarding the results in 2 weeks, please contact this office.    Influenza Virus Vaccine injection What is this medicine? INFLUENZA VIRUS VACCINE (in floo EN zuh VAHY ruhs vak SEEN) helps to reduce the risk of getting influenza also known as the flu. The vaccine only helps protect you against some strains of the flu. This medicine may be used for other purposes; ask your health care provider or pharmacist if you have questions. COMMON BRAND NAME(S): Afluria, Agriflu, Alfuria, FLUAD, Fluarix, Fluarix Quadrivalent, Flublok, Flublok Quadrivalent, FLUCELVAX, Flulaval, Fluvirin, Fluzone, Fluzone High-Dose, Fluzone Intradermal What should I tell my health  care provider before I take this medicine? They need to know if you have any of these conditions: -bleeding disorder like hemophilia -fever or infection -Guillain-Barre syndrome or other neurological problems -immune system problems -infection with the human immunodeficiency virus (HIV) or AIDS -low blood platelet counts -multiple sclerosis -an unusual or allergic reaction to influenza virus vaccine, latex, other medicines, foods, dyes, or preservatives. Different brands of vaccines contain different allergens. Some may contain latex or eggs. Talk to your doctor about your allergies to make sure that you get the right vaccine. -pregnant or trying to get pregnant -breast-feeding How should I use this medicine? This vaccine is for injection into a muscle or under the skin. It is given by a health care professional. A copy of Vaccine Information Statements will be given before each vaccination. Read this sheet carefully each time. The sheet may change frequently. Talk to your healthcare provider to see which vaccines are right for you. Some vaccines should not be used in all age groups. Overdosage: If you think you have taken too much of this medicine contact a poison control center or emergency room at once. NOTE: This medicine is only for you. Do not share this medicine with others. What if I miss a  dose? This does not apply. What may interact with this medicine? -chemotherapy or radiation therapy -medicines that lower your immune system like etanercept, anakinra, infliximab, and adalimumab -medicines that treat or prevent blood clots like warfarin -phenytoin -steroid medicines like prednisone or cortisone -theophylline -vaccines This list may not describe all possible interactions. Give your health care provider a list of all the medicines, herbs, non-prescription drugs, or dietary supplements you use. Also tell them if you smoke, drink alcohol, or use illegal drugs. Some items may  interact with your medicine. What should I watch for while using this medicine? Report any side effects that do not go away within 3 days to your doctor or health care professional. Call your health care provider if any unusual symptoms occur within 6 weeks of receiving this vaccine. You may still catch the flu, but the illness is not usually as bad. You cannot get the flu from the vaccine. The vaccine will not protect against colds or other illnesses that may cause fever. The vaccine is needed every year. What side effects may I notice from receiving this medicine? Side effects that you should report to your doctor or health care professional as soon as possible: -allergic reactions like skin rash, itching or hives, swelling of the face, lips, or tongue Side effects that usually do not require medical attention (report to your doctor or health care professional if they continue or are bothersome): -fever -headache -muscle aches and pains -pain, tenderness, redness, or swelling at the injection site -tiredness This list may not describe all possible side effects. Call your doctor for medical advice about side effects. You may report side effects to FDA at 1-800-FDA-1088. Where should I keep my medicine? The vaccine will be given by a health care professional in a clinic, pharmacy, doctor's office, or other health care setting. You will not be given vaccine doses to store at home. NOTE: This sheet is a summary. It may not cover all possible information. If you have questions about this medicine, talk to your doctor, pharmacist, or health care provider.  2018 Elsevier/Gold Standard (2014-10-13 10:07:28)

## 2018-03-02 NOTE — Progress Notes (Signed)
Presents today for [facility] Visit-Initial.   Date of last exam: 01/12/17  Interpreter used for this visit? no  Patient Care Team: Rutherford Guys, MD as PCP - General (Family Medicine)   Other items to address today: yes  Feels depression is not well controlled Has been on current medication for many years In the past as also tried celexa  Having issues with bloating and gassiness Reports long standing constipation but for past year has mostly normal stools No diarrhea, no abd pain, no nausea, no vomiting, no reflux, no changes in weight  Cancer Screening: Cervical: n/a, hysterectomy for benign reasons Breast: yes, 2018, normal Colon: yes, colonoscopy in 2004, ordering colguard today Prostate: no   Other Screening: Last screening for diabetes: doing today Last lipid screening: march 2019, improved, on medications   ADVANCE DIRECTIVES: Discussed: yes Patient desires CPR (Yes ), mechanical ventilation (Yes ), prolonged artificial support (may include mechanical ventilation, tube/PEG feeding, etc) (No ). On File: no Materials Provided: yes   Immunization status:  Immunization History  Administered Date(s) Administered  . Influenza, High Dose Seasonal PF 03/02/2018  . Influenza,inj,Quad PF,6+ Mos 01/03/2017  . Pneumococcal Conjugate-13 10/21/2017  . Zoster 04/08/2015     Health Maintenance Due  Topic Date Due  . 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 0 points  What month? 0 points  What time? 0 points  Count back from 20 0 points  Months in reverse 0 points  Repeat phrase 0 points  Total Score 0     Office Visit from 03/02/2018 in [facility] at [facility]  AUDIT-C Score  1      Patient Active Problem List   Diagnosis Date Noted  . Essential hypertension, benign 10/22/2017  . Benign hematuria 06/17/2017  . Pure hypercholesterolemia 08/11/2016  . Anxiety and depression 08/11/2016  . Chronic migraine without aura without status migrainosus, not intractable 08/11/2016     Past Medical History:  Diagnosis Date  . Allergy    Zyrtec, Benadryl  . Anxiety    Zoloft since several years  . Cataract   . Hyperlipidemia   . Hypertension   . Migraines    topmax and Imitrex     Past Surgical History:  Procedure Laterality Date  . ABDOMINAL HYSTERECTOMY     uterine prolapse; ovaries INTACT  . BLADDER REPAIR    . BREAST BIOPSY    . BREAST EXCISIONAL BIOPSY    . BREAST SURGERY     breast bx x 2 in 1980s; benign     Family History  Problem Relation Age of Onset  . Dementia Mother   . Cancer Father 73       oral cancer  . Heart disease Father        PAD/femoral bypass  . Hypertension Father   . Hyperlipidemia Father   . Alcohol abuse Father   . Hyperlipidemia Sister   . Hypertension Sister   . Breast cancer  Maternal Aunt      Social History   Socioeconomic History  . Marital status: Married    Spouse name: Not on file  . Number of children: 2  . Years of education: Not on file  . Highest education level: Not on file  Occupational History  . Occupation: employed  Scientific laboratory technician  . Financial resource strain: Not on file  . Food insecurity:    Worry: Not on file    Inability: Not on file  . Transportation needs:    Medical: Not on file    Non-medical: Not on file  Tobacco Use  . Smoking status: Never Smoker  . Smokeless tobacco:  Never Used  Substance and Sexual Activity  . Alcohol use: Yes    Comment: rarely  . Drug use: No  . Sexual activity: Yes    Birth control/protection: Post-menopausal, Surgical  Lifestyle  . Physical activity:    Days per week: Not on file    Minutes per session: Not on file  . Stress: Not on file  Relationships  . Social connections:    Talks on phone: Not on file    Gets together: Not on file    Attends religious service: Not on file    Active member of club or organization: Not on file    Attends meetings of clubs or organizations: Not on file    Relationship status: Not on file  . Intimate partner violence:    Fear of current or ex partner: Not on file    Emotionally abused: Not on file    Physically abused: Not on file    Forced sexual activity: Not on file  Other Topics Concern  . 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      Alcohol: rare      Exercise: none     Allergies  Allergen Reactions  . Tetracyclines & Related Nausea And Vomiting  . Vicodin [Hydrocodone-Acetaminophen] Nausea And Vomiting     Prior to Admission medications   Medication Sig Start Date End Date Taking? Authorizing Provider  metoprolol succinate (TOPROL-XL) 100 MG 24 hr tablet TAKE 1 TABLET EVERY DAY. TAKE WITH OR IMMEDIATELY FOLLOWING A MEAL. 01/13/18  Yes Rutherford Guys, MD  pravastatin (PRAVACHOL) 40 MG tablet TAKE 1 TABLET EVERY DAY 01/13/18  Yes Rutherford Guys, MD  promethazine (PHENERGAN) 25 MG tablet Take 1 tablet (25 mg total) by mouth every 6 (six) hours as needed for nausea or vomiting. 12/28/16  Yes Drenda Freeze, MD  sertraline (ZOLOFT) 100 MG tablet Take 1.5 tablets (150 mg total) by mouth daily. 06/17/17  Yes Wardell Honour, MD    SUMAtriptan (IMITREX) 100 MG tablet TAKE 1 TABLET BY MOUTH ONCE DAILY MAY  REPEAT  IN  2  HOURS  IF  HEADACHE  PERSISTS 06/17/17  Yes Wardell Honour, MD  amitriptyline (ELAVIL) 10 MG tablet Take 1-2 tablets (10-20 mg total) by mouth at bedtime. Patient not taking: Reported on 03/02/2018 10/21/17   Wardell Honour, MD  butalbital-acetaminophen-caffeine (FIORICET, ESGIC) [phone] MG tablet TAKE 1 TO 2 TABLETS BY MOUTH EVERY 6 HOURS AS NEEDED FOR HEADACHE Patient not taking: Reported on 03/02/2018 06/16/17  Wardell Honour, MD     Depression screen [facility] 2/9 03/02/2018 10/21/2017 01/14/2017 01/03/2017 07/12/2016  Decreased Interest 1 0 0 0 0  Down, Depressed, Hopeless 0 0 0 0 0  PHQ - 2 Score 1 0 0 0 0  Altered sleeping 1 - - - -  Tired, decreased energy 1 - - - -  Change in appetite 1 - - - -  Feeling bad or failure about yourself  0 - - - -  Trouble concentrating 0 - - - -  Moving slowly or fidgety/restless 0 - - - -  Suicidal thoughts 0 - - - -  PHQ-9 Score 4 - - - -  Difficult doing work/chores Somewhat difficult - - - -     Fall Risk  10/21/2017 01/14/2017 01/03/2017 07/12/2016  Falls in the past year? No No No No   Review of Systems  Constitutional: Negative for chills and fever.  HENT: Positive for congestion.   Respiratory: Negative for cough and shortness of breath.   Cardiovascular: Negative for chest pain, palpitations and leg swelling.  Gastrointestinal: Negative for abdominal pain, nausea and vomiting.  Psychiatric/Behavioral: Positive for depression. Negative for suicidal ideas.  All other systems reviewed and are negative. per hpi  PHYSICAL EXAM: BP (!) 146/88 (BP Location: Left Arm, Patient Position: Sitting, Cuff Size: Normal)   Pulse 67   Temp 97.6 F (36.4 C) (Oral)   Ht 5' 1.81" (1.57 m)   Wt 148 lb 3.2 oz (67.2 kg)   SpO2 98%   BMI 27.27 kg/m   Wt Readings from Last 3 Encounters:  03/02/18 148 lb 3.2 oz (67.2 kg)  10/21/17 143 lb (64.9 kg)  06/17/17 141 lb  9.6 oz (64.2 kg)   Wt Readings from Last 3 Encounters:  03/02/18 148 lb 3.2 oz (67.2 kg)  10/21/17 143 lb (64.9 kg)  06/17/17 141 lb 9.6 oz (64.2 kg)     Visual Acuity Screening   Right eye Left eye Both eyes  Without correction:     With correction: _0   Physical Exam  Constitutional: She is oriented to person, place, and time and well-developed, well-nourished, and in no distress.  HENT:  Head: Normocephalic and atraumatic.  Right Ear: Hearing, tympanic membrane, external ear and ear canal normal.  Left Ear: Hearing, tympanic membrane, external ear and ear canal normal.  Mouth/Throat: Oropharynx is clear and moist.  Eyes: Pupils are equal, round, and reactive to light. EOM are normal.  Neck: Neck supple. No thyromegaly present.  Cardiovascular: Normal rate, regular rhythm, normal heart sounds and intact distal pulses. Exam reveals no gallop and no friction rub.  No murmur heard. Pulmonary/Chest: Effort normal and breath sounds normal. She has no wheezes. She has no rales. Right breast exhibits no inverted nipple, no mass, no nipple discharge and no skin change. Left breast exhibits no inverted nipple, no mass, no nipple discharge and no skin change.  Abdominal: Soft. Bowel sounds are normal. She exhibits no distension and no mass. There is no tenderness.  Musculoskeletal: Normal range of motion. She exhibits no edema.  Lymphadenopathy:    She has no cervical adenopathy.    She has no axillary adenopathy.       Right: No supraclavicular adenopathy present.       Left: No supraclavicular adenopathy present.  Neurological: She is alert and oriented to person, place, and time. She has normal reflexes. Gait normal.  Skin: Skin is warm and dry.  Psychiatric: Mood  and affect normal.  Nursing note and vitals reviewed.  .  Education/Counseling provided regarding diet and exercise, prevention of chronic diseases, smoking/tobacco cessation if appropriate, and reviewed  "Covered Medicare Preventive Services"  ASSESSMENT/PLAN: 1. Welcome to Medicare preventive visit Routine HCM labs ordered. HCM reviewed/discussed. Anticipatory guidance regarding healthy weight, lifestyle and choices given.   2. Essential hypertension Controlled. Continue current regime.  - Lipid panel - TSH - CBC with Differential/Platelet - CMP14+EGFR  3. Colon cancer screening - Cologuard  4. Need for prophylactic vaccination and inoculation against influenza - Flu vaccine HIGH DOSE PF  5. Visit for screening mammogram - MM DIGITAL SCREENING BILATERAL; Future  6. Pure hypercholesterolemia Checking labs today, medications will be adjusted as needed.   7. Anxiety and depression Uncontrolled. Discussed treatment options. Patient already on high dose sertraline. Augmenting with wellbutrin, new med r/se/b reviewed Other orders - buPROPion (WELLBUTRIN SR) 100 MG 12 hr tablet; Take 1 tablet (100 mg total) by mouth 2 (two) times daily.  Return in about 6 weeks (around 04/13/2018) for depression.

## 2018-03-03 LAB — CBC WITH DIFFERENTIAL/PLATELET
Basophils Absolute: 0.1 10*3/uL (ref 0.0–0.2)
Basos: 1 %
EOS (ABSOLUTE): 0.3 10*3/uL (ref 0.0–0.4)
Eos: 5 %
Hematocrit: 38.1 % (ref 34.0–46.6)
Hemoglobin: 12.5 g/dL (ref 11.1–15.9)
Immature Grans (Abs): 0 10*3/uL (ref 0.0–0.1)
Immature Granulocytes: 0 %
Lymphocytes Absolute: 1.2 10*3/uL (ref 0.7–3.1)
Lymphs: 22 %
MCH: 30.6 pg (ref 26.6–33.0)
MCHC: 32.8 g/dL (ref 31.5–35.7)
MCV: 93 fL (ref 79–97)
Monocytes Absolute: 0.5 10*3/uL (ref 0.1–0.9)
Monocytes: 9 %
Neutrophils Absolute: 3.6 10*3/uL (ref 1.4–7.0)
Neutrophils: 63 %
Platelets: 266 10*3/uL (ref 150–450)
RBC: 4.08 x10E6/uL (ref 3.77–5.28)
RDW: 12.7 % (ref 12.3–15.4)
WBC: 5.7 10*3/uL (ref 3.4–10.8)

## 2018-03-03 LAB — CMP14+EGFR
ALT: 15 IU/L (ref 0–32)
AST: 20 IU/L (ref 0–40)
Albumin/Globulin Ratio: 1.8 (ref 1.2–2.2)
Albumin: 4.5 g/dL (ref 3.6–4.8)
Alkaline Phosphatase: 87 IU/L (ref 39–117)
BUN/Creatinine Ratio: 22 (ref 12–28)
BUN: 16 mg/dL (ref 8–27)
Bilirubin Total: 0.4 mg/dL (ref 0.0–1.2)
CO2: 22 mmol/L (ref 20–29)
Calcium: 9.6 mg/dL (ref 8.7–10.3)
Chloride: 104 mmol/L (ref 96–106)
Creatinine, Ser: 0.72 mg/dL (ref 0.57–1.00)
GFR calc Af Amer: 102 mL/min/{1.73_m2} (ref >59)
GFR calc non Af Amer: 88 mL/min/{1.73_m2} (ref >59)
Globulin, Total: 2.5 g/dL (ref 1.5–4.5)
Glucose: 102 mg/dL — ABNORMAL HIGH (ref 65–99)
Potassium: 4.3 mmol/L (ref 3.5–5.2)
Sodium: 142 mmol/L (ref 134–144)
Total Protein: 7 g/dL (ref 6.0–8.5)

## 2018-03-03 LAB — LIPID PANEL
Chol/HDL Ratio: 3.4 ratio (ref 0.0–4.4)
Cholesterol, Total: 180 mg/dL (ref 100–199)
HDL: 53 mg/dL (ref >39)
LDL Calculated: 107 mg/dL — ABNORMAL HIGH (ref 0–99)
Triglycerides: 100 mg/dL (ref 0–149)
VLDL Cholesterol Cal: 20 mg/dL (ref 5–40)

## 2018-03-03 LAB — TSH: TSH: 1.05 u[IU]/mL (ref 0.450–4.500)

## 2018-03-11 ENCOUNTER — Other Ambulatory Visit: Payer: Self-pay | Admitting: *Deleted

## 2018-03-11 MED ORDER — METOPROLOL SUCCINATE ER 100 MG PO TB24: ORAL_TABLET | ORAL | 1 refills | Status: DC

## 2018-03-11 MED ORDER — SERTRALINE HCL 100 MG PO TABS: 150.0000 mg | ORAL_TABLET | Freq: Every day | ORAL | 1 refills | Status: DC

## 2018-03-11 MED ORDER — PRAVASTATIN SODIUM 40 MG PO TABS: 40.0000 mg | ORAL_TABLET | Freq: Every day | ORAL | 1 refills | Status: DC

## 2018-03-11 MED ORDER — SUMATRIPTAN SUCCINATE 100 MG PO TABS: ORAL_TABLET | ORAL | 3 refills | Status: DC

## 2018-03-14 DIAGNOSIS — Z1211 Encounter for screening for malignant neoplasm of colon: Secondary | ICD-10-CM | POA: Diagnosis not present

## 2018-03-17 ENCOUNTER — Other Ambulatory Visit: Payer: Self-pay | Admitting: Family Medicine

## 2018-03-17 NOTE — Telephone Encounter (Signed)
Copied from CRM [id number]. Topic: Quick Communication - Rx Refill/Question >> Mar 17, 2018  9:30 AM Baldo DaubAlexander, Amber L wrote: Medication: SUMAtriptan (IMITREX) 100 MG tablet  Has the patient contacted their pharmacy?  Yes - states that pharmacy has had no response from us (Agent: If no, request that the patient contact the pharmacy for the refill.) (Agent: If yes, when and what did the pharmacy advise?)  Preferred Pharmacy (with phone number or street name): [facility] [place] - [id number] [phone] (Phone) [phone] (Fax)  Agent: Please be advised that RX refills may take up to 3 business days. We ask that you follow-up with your pharmacy.

## 2018-03-19 LAB — COLOGUARD: Cologuard: NEGATIVE

## 2018-03-24 ENCOUNTER — Other Ambulatory Visit: Payer: Self-pay

## 2018-03-24 ENCOUNTER — Ambulatory Visit (INDEPENDENT_AMBULATORY_CARE_PROVIDER_SITE_OTHER): Payer: Medicare HMO | Admitting: Family Medicine

## 2018-03-24 ENCOUNTER — Encounter: Payer: Self-pay | Admitting: Family Medicine

## 2018-03-24 VITALS — BP 140/74 | HR 67 | Temp 98.8°F | Resp 14 | Ht 61.5 in | Wt 149.0 lb

## 2018-03-24 DIAGNOSIS — J302 Other seasonal allergic rhinitis: Secondary | ICD-10-CM | POA: Diagnosis not present

## 2018-03-24 DIAGNOSIS — J0110 Acute frontal sinusitis, unspecified: Secondary | ICD-10-CM | POA: Diagnosis not present

## 2018-03-24 MED ORDER — FLUTICASONE PROPIONATE 50 MCG/ACT NA SUSP: 1.0000 | Freq: Two times a day (BID) | NASAL | 6 refills | Status: AC

## 2018-03-24 MED ORDER — AMOXICILLIN-POT CLAVULANATE 875-125 MG PO TABS: 1.0000 | ORAL_TABLET | Freq: Two times a day (BID) | ORAL | 0 refills | Status: DC

## 2018-03-24 MED ORDER — FLUCONAZOLE 150 MG PO TABS: 150.0000 mg | ORAL_TABLET | Freq: Once | ORAL | 0 refills | Status: AC

## 2018-03-24 NOTE — Patient Instructions (Addendum)
Start netty pot or other forms of nasal saline irrigation   Sinusitis, Adult Sinusitis is inflammation of your sinuses. Sinuses are hollow spaces in the bones around your face. Your sinuses are located:  Around your eyes.  In the middle of your forehead.  Behind your nose.  In your cheekbones. Mucus normally drains out of your sinuses. When your nasal tissues become inflamed or swollen, mucus can become trapped or blocked. This allows bacteria, viruses, and fungi to grow, which leads to infection. Most infections of the sinuses are caused by a virus. Sinusitis can develop quickly. It can last for up to 4 weeks (acute) or for more than 12 weeks (chronic). Sinusitis often develops after a cold. What are the causes? This condition is caused by anything that creates swelling in the sinuses or stops mucus from draining. This includes:  Allergies.  Asthma.  Infection from bacteria or viruses.  Deformities or blockages in your nose or sinuses.  Abnormal growths in the nose (nasal polyps).  Pollutants, such as chemicals or irritants in the air.  Infection from fungi (rare). What increases the risk? You are more likely to develop this condition if you:  Have a weak body defense system (immune system).  Do a lot of swimming or diving.  Overuse nasal sprays.  Smoke. What are the signs or symptoms? The main symptoms of this condition are pain and a feeling of pressure around the affected sinuses. Other symptoms include:  Stuffy nose or congestion.  Thick drainage from your nose.  Swelling and warmth over the affected sinuses.  Headache.  Upper toothache.  A cough that may get worse at night.  Extra mucus that collects in the throat or the back of the nose (postnasal drip).  Decreased sense of smell and taste.  Fatigue.  A fever.  Sore throat.  Bad breath. How is this diagnosed? This condition is diagnosed based on:  Your symptoms.  Your medical  history.  A physical exam.  Tests to find out if your condition is acute or chronic. This may include: ? Checking your nose for nasal polyps. ? Viewing your sinuses using a device that has a light (endoscope). ? Testing for allergies or bacteria. ? Imaging tests, such as an MRI or CT scan. In rare cases, a bone biopsy may be done to rule out more serious types of fungal sinus disease. How is this treated? Treatment for sinusitis depends on the cause and whether your condition is chronic or acute.  If caused by a virus, your symptoms should go away on their own within 10 days. You may be given medicines to relieve symptoms. They include: ? Medicines that shrink swollen nasal passages (topical intranasal decongestants). ? Medicines that treat allergies (antihistamines). ? A spray that eases inflammation of the nostrils (topical intranasal corticosteroids). ? Rinses that help get rid of thick mucus in your nose (nasal saline washes).  If caused by bacteria, your health care provider may recommend waiting to see if your symptoms improve. Most bacterial infections will get better without antibiotic medicine. You may be given antibiotics if you have: ? A severe infection. ? A weak immune system.  If caused by narrow nasal passages or nasal polyps, you may need to have surgery. Follow these instructions at home: Medicines  Take, use, or apply over-the-counter and prescription medicines only as told by your health care provider. These may include nasal sprays.  If you were prescribed an antibiotic medicine, take it as told by your  health care provider. Do not stop taking the antibiotic even if you start to feel better. Hydrate and humidify   Drink enough fluid to keep your urine pale yellow. Staying hydrated will help to thin your mucus.  Use a cool mist humidifier to keep the humidity level in your home above 50%.  Inhale steam for 10-15 minutes, 3-4 times a day, or as told by your  health care provider. You can do this in the bathroom while a hot shower is running.  Limit your exposure to cool or dry air. Rest  Rest as much as possible.  Sleep with your head raised (elevated).  Make sure you get enough sleep each night. General instructions   Apply a warm, moist washcloth to your face 3-4 times a day or as told by your health care provider. This will help with discomfort.  Wash your hands often with soap and water to reduce your exposure to germs. If soap and water are not available, use hand sanitizer.  Do not smoke. Avoid being around people who are smoking (secondhand smoke).  Keep all follow-up visits as told by your health care provider. This is important. Contact a health care provider if:  You have a fever.  Your symptoms get worse.  Your symptoms do not improve within 10 days. Get help right away if:  You have a severe headache.  You have persistent vomiting.  You have severe pain or swelling around your face or eyes.  You have vision problems.  You develop confusion.  Your neck is stiff.  You have trouble breathing. Summary  Sinusitis is soreness and inflammation of your sinuses. Sinuses are hollow spaces in the bones around your face.  This condition is caused by nasal tissues that become inflamed or swollen. The swelling traps or blocks the flow of mucus. This allows bacteria, viruses, and fungi to grow, which leads to infection.  If you were prescribed an antibiotic medicine, take it as told by your health care provider. Do not stop taking the antibiotic even if you start to feel better.  Keep all follow-up visits as told by your health care provider. This is important. This information is not intended to replace advice given to you by your health care provider. Make sure you discuss any questions you have with your health care provider. Document Released: 03/24/2005 Document Revised: 08/24/2017 Document Reviewed:  08/24/2017 Elsevier Interactive Patient Education  [employer].   If you have lab work done today you will be contacted with your lab results within the next 2 weeks.  If you have not heard from [place] then please contact us. The fastest way to get your results is to register for My Chart.   IF you received an x-ray today, you will receive an invoice from [facility]. Please contact [facility] at [phone] with questions or concerns regarding your invoice.   IF you received labwork today, you will receive an invoice from [facility]. Please contact [facility] at [phone] with questions or concerns regarding your invoice.   Our billing staff will not be able to assist you with questions regarding bills from these companies.  You will be contacted with the lab results as soon as they are available. The fastest way to get your results is to activate your My Chart account. Instructions are located on the last page of this paperwork. If you have not heard from [place] regarding the results in 2 weeks, please contact this office.

## 2018-03-24 NOTE — Progress Notes (Signed)
12/18/20198:36 AM  Caroline ShoreAnne [name] 04/27/1950, 67 y.o. female [id number]  Chief Complaint  Patient presents with  . URI    cough, chest congestion, sorethroat, itchy ears, thick green/yellow mucus x 3 wks. Have been taking otc meds which which has only helped a little    HPI:   Patient is a 67 y.o. female with past medical history significant for HTN, HLP, migraines, depression and anxiety who presents today for URI sx  For about 3 weeks, worsening Nasal congestion, thick yellow mucous with considerable sinus pressure Coughing, sometimes pressure in her chest - SOB, no wheezing A bit achy  Itchy ears Some sore throat mild sneezing No fever or chills Granddaughter was just dx influenza b - 2 days ago Has had flu vaccine this season Tried alka-seltzer, robitussin, ibu - mild relief Takes zyrtec daily for allergies No asthma nonsmoker  Fall Risk  03/24/2018 10/21/2017 01/14/2017 01/03/2017 07/12/2016  Falls in the past year? 0 No No No No     [facility] 2/9 03/24/2018 03/02/2018 10/21/2017  Decreased Interest 0 1 0  Down, Depressed, Hopeless 0 0 0  PHQ - 2 Score 0 1 0  Altered sleeping - 1 -  Tired, decreased energy - 1 -  Change in appetite - 1 -  Feeling bad or failure about yourself  - 0 -  Trouble concentrating - 0 -  Moving slowly or fidgety/restless - 0 -  Suicidal thoughts - 0 -  PHQ-9 Score - 4 -  Difficult doing work/chores - Somewhat difficult -    Allergies  Allergen Reactions  . Tetracyclines & Related Nausea And Vomiting  . Vicodin [Hydrocodone-Acetaminophen] Nausea And Vomiting    Prior to Admission medications   Medication Sig Start Date End Date Taking? Authorizing Provider  buPROPion (WELLBUTRIN SR) 100 MG 12 hr tablet Take 1 tablet (100 mg total) by mouth 2 (two) times daily. 03/02/18  Yes Myles LippsSantiago, Layne Lebon M, MD  metoprolol succinate (TOPROL-XL) 100 MG 24 hr tablet TAKE 1 TABLET EVERY DAY. TAKE WITH OR IMMEDIATELY FOLLOWING A MEAL. 03/11/18   Yes Myles LippsSantiago, Nunzio Banet M, MD  pravastatin (PRAVACHOL) 40 MG tablet Take 1 tablet (40 mg total) by mouth daily. 03/11/18  Yes Myles LippsSantiago, Senica Crall M, MD  promethazine (PHENERGAN) 25 MG tablet Take 1 tablet (25 mg total) by mouth every 6 (six) hours as needed for nausea or vomiting. 12/28/16  Yes Charlynne PanderYao, David Hsienta, MD  sertraline (ZOLOFT) 100 MG tablet Take 1.5 tablets (150 mg total) by mouth daily. 03/11/18  Yes Myles LippsSantiago, Lattie Riege M, MD  SUMAtriptan (IMITREX) 100 MG tablet TAKE 1 TABLET BY MOUTH ONCE DAILY MAY  REPEAT  IN  2  HOURS  IF  HEADACHE  PERSISTS 03/11/18  Yes Myles LippsSantiago, Tyrica Afzal M, MD    Past Medical History:  Diagnosis Date  . Allergy    Zyrtec, Benadryl  . Anxiety    Zoloft since several years  . Cataract   . Hyperlipidemia   . Hypertension   . Migraines    topmax and Imitrex    Past Surgical History:  Procedure Laterality Date  . ABDOMINAL HYSTERECTOMY     uterine prolapse; ovaries INTACT  . BLADDER REPAIR    . BREAST BIOPSY    . BREAST EXCISIONAL BIOPSY    . BREAST SURGERY     breast bx x 2 in 1980s; benign    Social History   Tobacco Use  . Smoking status: Never Smoker  . Smokeless tobacco: Never Used  Substance  Use Topics  . Alcohol use: Yes    Comment: rarely    Family History  Problem Relation Age of Onset  . Dementia Mother   . Cancer Father 31       oral cancer  . Heart disease Father        PAD/femoral bypass  . Hypertension Father   . Hyperlipidemia Father   . Alcohol abuse Father   . Hyperlipidemia Sister   . Hypertension Sister   . Breast cancer Maternal Aunt     ROS Per hpi  OBJECTIVE:  Blood pressure 140/74, pulse 67, temperature 98.8 F (37.1 C), temperature source Oral, resp. rate 14, height 5' 1.5" (1.562 m), weight 149 lb (67.6 kg), SpO2 97 %. Body mass index is 27.7 kg/m.   Physical Exam Vitals signs and nursing note reviewed.  Constitutional:      Appearance: She is well-developed.  HENT:     Head: Normocephalic and atraumatic.      Right Ear: Hearing, tympanic membrane, ear canal and external ear normal.     Left Ear: Hearing, tympanic membrane, ear canal and external ear normal.     Nose: Mucosal edema, congestion and rhinorrhea present.     Right Sinus: Maxillary sinus tenderness and frontal sinus tenderness present.  Eyes:     Conjunctiva/sclera: Conjunctivae normal.     Pupils: Pupils are equal, round, and reactive to light.  Neck:     Musculoskeletal: Neck supple.  Cardiovascular:     Rate and Rhythm: Normal rate and regular rhythm.     Heart sounds: Normal heart sounds. No murmur. No friction rub. No gallop.   Pulmonary:     Effort: Pulmonary effort is normal.     Breath sounds: Normal breath sounds. No wheezing or rales.  Lymphadenopathy:     Cervical: No cervical adenopathy.  Skin:    General: Skin is warm and dry.  Neurological:     Mental Status: She is alert and oriented to person, place, and time.     ASSESSMENT and PLAN  1. Acute non-recurrent frontal sinusitis 2. Seasonal allergies Discussed supportive measures, new meds r/se/b and RTC precautions. Patient educational handout given.  Other orders - amoxicillin-clavulanate (AUGMENTIN) 875-125 MG tablet; Take 1 tablet by mouth 2 (two) times daily. - fluconazole (DIFLUCAN) 150 MG tablet; Take 1 tablet (150 mg total) by mouth once for 1 dose. Repeat if needed - fluticasone (FLONASE) 50 MCG/ACT nasal spray; Place 1 spray into both nostrils 2 (two) times daily.  Return for as scheduled.    Myles Lipps, MD Primary Care at [facility] [address] [place], [place] [address] Ph.  [phone] Fax [phone]

## 2018-03-27 ENCOUNTER — Telehealth: Payer: Self-pay | Admitting: Family Medicine

## 2018-03-27 NOTE — Telephone Encounter (Signed)
Pt states that the amoxicillin is giving her diarrhea and she is wondering if she should discontinue and take something else or keep going.  She states that she has 4 days left.  Please advise

## 2018-04-08 ENCOUNTER — Ambulatory Visit: Payer: Self-pay

## 2018-04-08 ENCOUNTER — Telehealth: Payer: Self-pay | Admitting: Family Medicine

## 2018-04-08 NOTE — Telephone Encounter (Signed)
Called pt.  Stated she has had flare up of sinus congestion/ pressure in her cheeks, nasal congestion, post nasal drip, and cough over past week.  Denied fever.  Reported at last appt. 12/18, she was prescribed Augmentin, but only able to take it 3.5 days, due to side effect of diarrhea.  Questioned if the she should resume taking the Augmentin, or if she should take another antibiotic.  Reported she has been doing nasal washes, nasal spray, and Tylenol.  Advised she will need another appt. for reevaluation.  Requested appt. on Tuesday, as it is best for her work schedule.  Advised no available appts. on Tuesday.  Advised that a Saturday appt. Is an option, but not able to book until 1/3.  Stated she will call back in the morning to request a Saturday appt.    Pt. requested Cologuard results.  Advised per Dr. Adela Glimpse note of 12/17, cologuard was negative, and recommended to repeat in 3 yrs.  Pt. verb. understanding.           Reason for Disposition . [1] Sinus congestion (pressure, fullness) AND [2] present > 10 days  Answer Assessment - Initial Assessment Questions 1. LOCATION: "Where does it hurt?"      Sinus pressure in cheeks  2. ONSET: "When did the sinus pain start?"  (e.g., hours, days)      Has worsened over past week 3. SEVERITY: "How bad is the pain?"   (Scale 1-10; mild, moderate or severe)   - MILD (1-3): doesn't interfere with normal activities    - MODERATE (4-7): interferes with normal activities (e.g., work or school) or awakens from sleep   - SEVERE (8-10): excruciating pain and patient unable to do any normal activities        moderate 4. RECURRENT SYMPTOM: "Have you ever had sinus problems before?" If so, ask: "When was the last time?" and "What happened that time?"      Yes; took Augmentin x 3/5 days, but stopped due to diarrhea stools.   5. NASAL CONGESTION: "Is the nose blocked?" If so, ask, "Can you open it or must you breathe through the mouth?"    Yes 6. NASAL  DISCHARGE: "Do you have discharge from your nose?" If so ask, "What color?"     yellow 7. FEVER: "Do you have a fever?" If so, ask: "What is it, how was it measure denied  8. OTHER SYMPTOMS: "Do you have any other symptoms?" (e.g., sore throat, cough, earache, difficulty breathing)    Scratchy throat, cough, post nasal drip, mild headache 9. PREGNANCY: "Is there any chance you are pregnant?" "When was your last menstrual period?"     n/a  Protocols used: SINUS PAIN OR CONGESTION-A-AH  Message from Valora Piccolo sent at 04/08/2018 1:11 PM EST   Pt previous called for advice because she stopped taking amoxicillin because it gave her diarrhea and she is wondering if start taking the medicine again. She states that she feels like the infection is returning. Or is another antibotic necessary to avoid diarrhea.

## 2018-04-08 NOTE — Telephone Encounter (Signed)
Copied from CRM [id number]. Topic: Quick Communication - See Telephone Encounter >> Apr 08, 2018  1:11 PM Jens Som A wrote: CRM for notification. See Telephone encounter for: 04/08/18.  Patient is calling to get the results of her cologard please advise

## 2018-04-09 NOTE — Telephone Encounter (Signed)
Cologard results negative. Okay to leave message on voicemail.

## 2018-04-10 ENCOUNTER — Ambulatory Visit (INDEPENDENT_AMBULATORY_CARE_PROVIDER_SITE_OTHER): Payer: Medicare HMO | Admitting: Family Medicine

## 2018-04-10 ENCOUNTER — Other Ambulatory Visit: Payer: Self-pay

## 2018-04-10 ENCOUNTER — Encounter: Payer: Self-pay | Admitting: Family Medicine

## 2018-04-10 VITALS — BP 135/81 | HR 65 | Temp 99.0°F | Resp 18 | Ht 61.5 in | Wt 147.4 lb

## 2018-04-10 DIAGNOSIS — J0101 Acute recurrent maxillary sinusitis: Secondary | ICD-10-CM

## 2018-04-10 MED ORDER — CEFIXIME 400 MG PO CAPS: 400.0000 mg | ORAL_CAPSULE | Freq: Every day | ORAL | 0 refills | Status: DC

## 2018-04-10 MED ORDER — CEFPODOXIME PROXETIL 200 MG PO TABS: 200.0000 mg | ORAL_TABLET | Freq: Two times a day (BID) | ORAL | 0 refills | Status: DC

## 2018-04-10 NOTE — Patient Instructions (Addendum)
   If you have lab work done today you will be contacted with your lab results within the next 2 weeks.  If you have not heard from us then please contact us. The fastest way to get your results is to register for My Chart.   IF you received an x-ray today, you will receive an invoice from [facility]. Please contact [facility] at [phone] with questions or concerns regarding your invoice.   IF you received labwork today, you will receive an invoice from [facility]. Please contact [facility] at [phone] with questions or concerns regarding your invoice.   Our billing staff will not be able to assist you with questions regarding bills from these companies.  You will be contacted with the lab results as soon as they are available. The fastest way to get your results is to activate your My Chart account. Instructions are located on the last page of this paperwork. If you have not heard from us regarding the results in 2 weeks, please contact this office.     Sinusitis, Adult Sinusitis is soreness and swelling (inflammation) of your sinuses. Sinuses are hollow spaces in the bones around your face. They are located:  Around your eyes.  In the middle of your forehead.  Behind your nose.  In your cheekbones. Your sinuses and nasal passages are lined with a fluid called mucus. Mucus drains out of your sinuses. Swelling can trap mucus in your sinuses. This lets germs (bacteria, virus, or fungus) grow, which leads to infection. Most of the time, this condition is caused by a virus. What are the causes? This condition is caused by:  Allergies.  Asthma.  Germs.  Things that block your nose or sinuses.  Growths in the nose (nasal polyps).  Chemicals or irritants in the air.  Fungus (rare). What increases the risk? You are more likely to develop this condition if:  You have a weak body defense system (immune system).  You do a lot of swimming or  diving.  You use nasal sprays too much.  You smoke. What are the signs or symptoms? The main symptoms of this condition are pain and a feeling of pressure around the sinuses. Other symptoms include:  Stuffy nose (congestion).  Runny nose (drainage).  Swelling and warmth in the sinuses.  Headache.  Toothache.  A cough that may get worse at night.  Mucus that collects in the throat or the back of the nose (postnasal drip).  Being unable to smell and taste.  Being very tired (fatigue).  A fever.  Sore throat.  Bad breath. How is this diagnosed? This condition is diagnosed based on:  Your symptoms.  Your medical history.  A physical exam.  Tests to find out if your condition is short-term (acute) or long-term (chronic). Your doctor may: ? Check your nose for growths (polyps). ? Check your sinuses using a tool that has a light (endoscope). ? Check for allergies or germs. ? Do imaging tests, such as an MRI or CT scan. How is this treated? Treatment for this condition depends on the cause and whether it is short-term or long-term.  If caused by a virus, your symptoms should go away on their own within 10 days. You may be given medicines to relieve symptoms. They include: ? Medicines that shrink swollen tissue in the nose. ? Medicines that treat allergies (antihistamines). ? A spray that treats swelling of the nostrils. ? Rinses that help get rid of thick mucus in your   nose (nasal saline washes).  If caused by bacteria, your doctor may wait to see if you will get better without treatment. You may be given antibiotic medicine if you have: ? A very bad infection. ? A weak body defense system.  If caused by growths in the nose, you may need to have surgery. Follow these instructions at home: Medicines  Take, use, or apply over-the-counter and prescription medicines only as told by your doctor. These may include nasal sprays.  If you were prescribed an antibiotic  medicine, take it as told by your doctor. Do not stop taking the antibiotic even if you start to feel better. Hydrate and humidify   Drink enough water to keep your pee (urine) pale yellow.  Use a cool mist humidifier to keep the humidity level in your home above 50%.  Breathe in steam for 10-15 minutes, 3-4 times a day, or as told by your doctor. You can do this in the bathroom while a hot shower is running.  Try not to spend time in cool or dry air. Rest  Rest as much as you can.  Sleep with your head raised (elevated).  Make sure you get enough sleep each night. General instructions   Put a warm, moist washcloth on your face 3-4 times a day, or as often as told by your doctor. This will help with discomfort.  Wash your hands often with soap and water. If there is no soap and water, use hand sanitizer.  Do not smoke. Avoid being around people who are smoking (secondhand smoke).  Keep all follow-up visits as told by your doctor. This is important. Contact a doctor if:  You have a fever.  Your symptoms get worse.  Your symptoms do not get better within 10 days. Get help right away if:  You have a very bad headache.  You cannot stop throwing up (vomiting).  You have very bad pain or swelling around your face or eyes.  You have trouble seeing.  You feel confused.  Your neck is stiff.  You have trouble breathing. Summary  Sinusitis is swelling of your sinuses. Sinuses are hollow spaces in the bones around your face.  This condition is caused by tissues in your nose that become inflamed or swollen. This traps germs. These can lead to infection.  If you were prescribed an antibiotic medicine, take it as told by your doctor. Do not stop taking it even if you start to feel better.  Keep all follow-up visits as told by your doctor. This is important. This information is not intended to replace advice given to you by your health care provider. Make sure you discuss  any questions you have with your health care provider. Document Released: 09/10/2007 Document Revised: 08/24/2017 Document Reviewed: 08/24/2017 Elsevier Interactive Patient Education  2019 [employer]  

## 2018-04-10 NOTE — Progress Notes (Signed)
1/4/202011:35 AM  Caroline [name] Aug 15, 1950, 68 y.o. female [id number]  Chief Complaint  Patient presents with  . Sinus Problem    FOLLOW UP augmentin helped a little but stopped taking due to diarrhea     HPI:   Patient is a 68 y.o. female with past medical history significant for HTN, HLP, migraines, depression and anxiety who presents today for followup on sinusitis  Last OV 03/24/18 - treated with augmentin and flonase Took augmentin for about 3 days but had to stop due to diarrhea Was getting better but sinus pressure and drainage has come back No fever, feels a bit SOB and wheezing at time, specially when she lies down Still doing flonase, zytrec, netty pot every day No h/o asthma Does not smoke    Fall Risk  04/10/2018 03/24/2018 10/21/2017 01/14/2017 01/03/2017  Falls in the past year? 0 0 No No No     Depression screen [facility] 2/9 04/10/2018 03/24/2018 03/02/2018  Decreased Interest 0 0 1  Down, Depressed, Hopeless 0 0 0  PHQ - 2 Score 0 0 1  Altered sleeping - - 1  Tired, decreased energy - - 1  Change in appetite - - 1  Feeling bad or failure about yourself  - - 0  Trouble concentrating - - 0  Moving slowly or fidgety/restless - - 0  Suicidal thoughts - - 0  PHQ-9 Score - - 4  Difficult doing work/chores - - Somewhat difficult    Allergies  Allergen Reactions  . Tetracyclines & Related Nausea And Vomiting  . Vicodin [Hydrocodone-Acetaminophen] Nausea And Vomiting    Prior to Admission medications   Medication Sig Start Date End Date Taking? Authorizing Provider  buPROPion (WELLBUTRIN SR) 100 MG 12 hr tablet Take 1 tablet (100 mg total) by mouth 2 (two) times daily. 03/02/18  Yes Myles Lipps, MD  fluticasone (FLONASE) 50 MCG/ACT nasal spray Place 1 spray into both nostrils 2 (two) times daily. 03/24/18  Yes Myles Lipps, MD  metoprolol succinate (TOPROL-XL) 100 MG 24 hr tablet TAKE 1 TABLET EVERY DAY. TAKE WITH OR IMMEDIATELY FOLLOWING A MEAL. 03/11/18   Yes Myles Lipps, MD  pravastatin (PRAVACHOL) 40 MG tablet Take 1 tablet (40 mg total) by mouth daily. 03/11/18  Yes Myles Lipps, MD  promethazine (PHENERGAN) 25 MG tablet Take 1 tablet (25 mg total) by mouth every 6 (six) hours as needed for nausea or vomiting. 12/28/16  Yes Charlynne Pander, MD  sertraline (ZOLOFT) 100 MG tablet Take 1.5 tablets (150 mg total) by mouth daily. 03/11/18  Yes Myles Lipps, MD  SUMAtriptan (IMITREX) 100 MG tablet TAKE 1 TABLET BY MOUTH ONCE DAILY MAY  REPEAT  IN  2  HOURS  IF  HEADACHE  PERSISTS 03/11/18  Yes Myles Lipps, MD  amoxicillin-clavulanate (AUGMENTIN) 875-125 MG tablet Take 1 tablet by mouth 2 (two) times daily. Patient not taking: Reported on 04/10/2018 03/24/18   Myles Lipps, MD    Past Medical History:  Diagnosis Date  . Allergy    Zyrtec, Benadryl  . Anxiety    Zoloft since several years  . Cataract   . Hyperlipidemia   . Hypertension   . Migraines    topmax and Imitrex    Past Surgical History:  Procedure Laterality Date  . ABDOMINAL HYSTERECTOMY     uterine prolapse; ovaries INTACT  . BLADDER REPAIR    . BREAST BIOPSY    . BREAST EXCISIONAL BIOPSY    .  BREAST SURGERY     breast bx x 2 in 1980s; benign    Social History   Tobacco Use  . Smoking status: Never Smoker  . Smokeless tobacco: Never Used  Substance Use Topics  . Alcohol use: Yes    Comment: rarely    Family History  Problem Relation Age of Onset  . Dementia Mother   . Cancer Father 76       oral cancer  . Heart disease Father        PAD/femoral bypass  . Hypertension Father   . Hyperlipidemia Father   . Alcohol abuse Father   . Hyperlipidemia Sister   . Hypertension Sister   . Breast cancer Maternal Aunt     ROS Per hpi  OBJECTIVE:  Blood pressure 135/81, pulse 65, temperature 99 F (37.2 C), temperature source Oral, resp. rate 18, height 5' 1.5" (1.562 m), weight 147 lb 6.4 oz (66.9 kg), SpO2 96 %. Body mass index is 27.4  kg/m.   Physical Exam Vitals signs and nursing note reviewed.  Constitutional:      Appearance: She is well-developed.  HENT:     Head: Normocephalic and atraumatic.     Right Ear: Hearing, tympanic membrane, ear canal and external ear normal.     Left Ear: Hearing, tympanic membrane, ear canal and external ear normal.     Nose: Rhinorrhea present.     Right Turbinates: Swollen and pale.     Left Turbinates: Not enlarged.     Right Sinus: Maxillary sinus tenderness and frontal sinus tenderness present.  Eyes:     Conjunctiva/sclera: Conjunctivae normal.     Pupils: Pupils are equal, round, and reactive to light.  Neck:     Musculoskeletal: Neck supple.  Cardiovascular:     Rate and Rhythm: Normal rate and regular rhythm.     Heart sounds: Normal heart sounds. No murmur. No friction rub. No gallop.   Pulmonary:     Effort: Pulmonary effort is normal.     Breath sounds: Normal breath sounds. No wheezing or rales.  Lymphadenopathy:     Cervical: No cervical adenopathy.  Skin:    General: Skin is warm and dry.  Neurological:     Mental Status: She is alert and oriented to person, place, and time.     ASSESSMENT and PLAN  1. Acute recurrent maxillary sinusitis Discussed supportive measures, new meds r/se/b and RTC precautions. Patient educational handout given. Sent suprax electronically but also gave paper rx for vantin in case suprax not covered by insurance given we will be closed this afternoon and tomorrow. Patient is aware to only take one of the antibiotics, whichever is covered by her insurance. Consider referral to ent.  Other orders - cefixime (SUPRAX) 400 MG CAPS capsule; Take 1 capsule (400 mg total) by mouth daily. - cefpodoxime (VANTIN) 200 MG tablet; Take 1 tablet (200 mg total) by mouth 2 (two) times daily.  Return if symptoms worsen or fail to improve.    Myles Lipps, MD Primary Care at [facility] [address] [place], [place] [address] Ph.   [phone] Fax [phone]

## 2018-04-20 ENCOUNTER — Ambulatory Visit (INDEPENDENT_AMBULATORY_CARE_PROVIDER_SITE_OTHER): Payer: Medicare HMO | Admitting: Family Medicine

## 2018-04-20 ENCOUNTER — Encounter: Payer: Self-pay | Admitting: Family Medicine

## 2018-04-20 VITALS — BP 137/80 | HR 62 | Temp 98.6°F | Ht 61.5 in | Wt 148.6 lb

## 2018-04-20 DIAGNOSIS — E7800 Pure hypercholesterolemia, unspecified: Secondary | ICD-10-CM

## 2018-04-20 DIAGNOSIS — G43709 Chronic migraine without aura, not intractable, without status migrainosus: Secondary | ICD-10-CM

## 2018-04-20 DIAGNOSIS — I10 Essential (primary) hypertension: Secondary | ICD-10-CM | POA: Diagnosis not present

## 2018-04-20 DIAGNOSIS — F419 Anxiety disorder, unspecified: Secondary | ICD-10-CM | POA: Diagnosis not present

## 2018-04-20 DIAGNOSIS — F329 Major depressive disorder, single episode, unspecified: Secondary | ICD-10-CM

## 2018-04-20 DIAGNOSIS — F32A Depression, unspecified: Secondary | ICD-10-CM

## 2018-04-20 MED ORDER — BUPROPION HCL ER (SR) 100 MG PO TB12: 100.0000 mg | ORAL_TABLET | Freq: Every day | ORAL | 3 refills | Status: DC

## 2018-04-20 NOTE — Progress Notes (Signed)
1/14/20208:19 AM  Caroline [name] 11-25-1950, 68 y.o. female [id number]  Chief Complaint  Patient presents with  . Depression    ph9 completed    HPI:   Patient is a 68 y.o. female with past medical history significant for HTN, HLP, migraines, depression and anxietywho presents today for followup  Last OV started wellbutrin SR 100mg  once a day Augmenting sertraline 150mg  daily Feels doing well, depression much improved Denies any side effects phq9 noted  Otherwise she is doing well Taking all her BP and cholesterol meds as prescribed Denies any side effects Does not need refills at this time Reviewed labs done nov 2019  Migraine much better since sinus are better controlled Taking zytrec and flonase daily   Lab Results  Component Value Date   CHOL 180 03/02/2018   HDL 53 03/02/2018   LDLCALC 107 (H) 03/02/2018   TRIG 100 03/02/2018   CHOLHDL 3.4 03/02/2018   Lab Results  Component Value Date   CREATININE 0.72 03/02/2018    Fall Risk  04/20/2018 04/10/2018 03/24/2018 10/21/2017 01/14/2017  Falls in the past year? 0 0 0 No No     Depression screen [facility] 2/9 04/20/2018 04/20/2018 04/10/2018  Decreased Interest 0 0 0  Down, Depressed, Hopeless 0 0 0  PHQ - 2 Score 0 0 0  Altered sleeping 0 - -  Tired, decreased energy 1 - -  Change in appetite 1 - -  Feeling bad or failure about yourself  0 - -  Trouble concentrating 1 - -  Moving slowly or fidgety/restless 0 - -  Suicidal thoughts 0 - -  PHQ-9 Score 3 - -  Difficult doing work/chores Not difficult at all - -    Allergies  Allergen Reactions  . Tetracyclines & Related Nausea And Vomiting  . Vicodin [Hydrocodone-Acetaminophen] Nausea And Vomiting    Prior to Admission medications   Medication Sig Start Date End Date Taking? Authorizing Provider  buPROPion (WELLBUTRIN SR) 100 MG 12 hr tablet Take 1 tablet (100 mg total) by mouth 2 (two) times daily. 03/02/18   Myles Lipps, MD  cefixime (SUPRAX) 400 MG CAPS  capsule Take 1 capsule (400 mg total) by mouth daily. 04/10/18   Myles Lipps, MD  fluticasone (FLONASE) 50 MCG/ACT nasal spray Place 1 spray into both nostrils 2 (two) times daily. 03/24/18   Myles Lipps, MD  metoprolol succinate (TOPROL-XL) 100 MG 24 hr tablet TAKE 1 TABLET EVERY DAY. TAKE WITH OR IMMEDIATELY FOLLOWING A MEAL. 03/11/18   Myles Lipps, MD  pravastatin (PRAVACHOL) 40 MG tablet Take 1 tablet (40 mg total) by mouth daily. 03/11/18   Myles Lipps, MD  promethazine (PHENERGAN) 25 MG tablet Take 1 tablet (25 mg total) by mouth every 6 (six) hours as needed for nausea or vomiting. 12/28/16   Charlynne Pander, MD  sertraline (ZOLOFT) 100 MG tablet Take 1.5 tablets (150 mg total) by mouth daily. 03/11/18   Myles Lipps, MD  SUMAtriptan (IMITREX) 100 MG tablet TAKE 1 TABLET BY MOUTH ONCE DAILY MAY  REPEAT  IN  2  HOURS  IF  HEADACHE  PERSISTS 03/11/18   Myles Lipps, MD    Past Medical History:  Diagnosis Date  . Allergy    Zyrtec, Benadryl  . Anxiety    Zoloft since several years  . Cataract   . Hyperlipidemia   . Hypertension   . Migraines    topmax and Imitrex    Past Surgical  History:  Procedure Laterality Date  . ABDOMINAL HYSTERECTOMY     uterine prolapse; ovaries INTACT  . BLADDER REPAIR    . BREAST BIOPSY    . BREAST EXCISIONAL BIOPSY    . BREAST SURGERY     breast bx x 2 in 1980s; benign    Social History   Tobacco Use  . Smoking status: Never Smoker  . Smokeless tobacco: Never Used  Substance Use Topics  . Alcohol use: Yes    Comment: rarely    Family History  Problem Relation Age of Onset  . Dementia Mother   . Cancer Father [id number]       oral cancer  . Heart disease Father        PAD/femoral bypass  . Hypertension Father   . Hyperlipidemia Father   . Alcohol abuse Father   . Hyperlipidemia Sister   . Hypertension Sister   . Breast cancer Maternal Aunt     Review of Systems  Constitutional: Negative for chills and fever.    Respiratory: Negative for cough and shortness of breath.   Cardiovascular: Negative for chest pain, palpitations and leg swelling.  Gastrointestinal: Negative for abdominal pain, nausea and vomiting.     OBJECTIVE:  Blood pressure 137/80, pulse 62, temperature 98.6 F (37 C), temperature source Oral, height 5' 1.5" (1.562 m), weight 148 lb 9.6 oz (67.4 kg), SpO2 95 %. Body mass index is 27.62 kg/m.   Physical Exam Vitals signs and nursing note reviewed.  Constitutional:      Appearance: She is well-developed.  HENT:     Head: Normocephalic and atraumatic.  Eyes:     General: No scleral icterus.    Conjunctiva/sclera: Conjunctivae normal.     Pupils: Pupils are equal, round, and reactive to light.  Neck:     Musculoskeletal: Neck supple.  Pulmonary:     Effort: Pulmonary effort is normal.  Skin:    General: Skin is warm and dry.  Neurological:     Mental Status: She is alert and oriented to person, place, and time.     ASSESSMENT and PLAN  1. Anxiety and depression Controlled. Continue current regime.   2. Essential hypertension Controlled. Continue current regime.   3. Pure hypercholesterolemia Controlled. Continue current regime.   4. Chronic migraine without aura without status migrainosus, not intractable Controlled. Continue current regime.   Other orders - buPROPion (WELLBUTRIN SR) 100 MG 12 hr tablet; Take 1 tablet (100 mg total) by mouth daily.   Return in about 6 months (around 10/19/2018).    Myles LippsIrma M Santiago, MD Primary Care at [facility] [address] [place], [place] [address] Ph.  [phone] Fax [phone]

## 2018-04-20 NOTE — Patient Instructions (Signed)
° ° ° °  If you have lab work done today you will be contacted with your lab results within the next 2 weeks.  If you have not heard from us then please contact us. The fastest way to get your results is to register for My Chart. ° ° °IF you received an x-ray today, you will receive an invoice from [facility]. Please contact [facility] at [phone] with questions or concerns regarding your invoice.  ° °IF you received labwork today, you will receive an invoice from [facility]. Please contact [facility] at [phone] with questions or concerns regarding your invoice.  ° °Our billing staff will not be able to assist you with questions regarding bills from these companies. ° °You will be contacted with the lab results as soon as they are available. The fastest way to get your results is to activate your My Chart account. Instructions are located on the last page of this paperwork. If you have not heard from us regarding the results in 2 weeks, please contact this office. °  ° ° ° °

## 2018-04-27 ENCOUNTER — Ambulatory Visit: Payer: BLUE CROSS/BLUE SHIELD

## 2018-04-27 ENCOUNTER — Other Ambulatory Visit: Payer: BLUE CROSS/BLUE SHIELD

## 2018-05-25 DIAGNOSIS — H25013 Cortical age-related cataract, bilateral: Secondary | ICD-10-CM | POA: Diagnosis not present

## 2018-05-25 DIAGNOSIS — H18413 Arcus senilis, bilateral: Secondary | ICD-10-CM | POA: Diagnosis not present

## 2018-05-25 DIAGNOSIS — H02831 Dermatochalasis of right upper eyelid: Secondary | ICD-10-CM | POA: Diagnosis not present

## 2018-05-25 DIAGNOSIS — H2512 Age-related nuclear cataract, left eye: Secondary | ICD-10-CM | POA: Diagnosis not present

## 2018-05-25 DIAGNOSIS — H2513 Age-related nuclear cataract, bilateral: Secondary | ICD-10-CM | POA: Diagnosis not present

## 2018-05-25 DIAGNOSIS — H25043 Posterior subcapsular polar age-related cataract, bilateral: Secondary | ICD-10-CM | POA: Diagnosis not present

## 2018-06-08 ENCOUNTER — Ambulatory Visit
Admission: RE | Admit: 2018-06-08 | Discharge: 2018-06-08 | Disposition: A | Discharge status: Home / Self Care | Payer: Medicare HMO | Source: Ambulatory Visit | Attending: Family Medicine | Admitting: Family Medicine

## 2018-06-08 DIAGNOSIS — Z780 Asymptomatic menopausal state: Secondary | ICD-10-CM | POA: Diagnosis not present

## 2018-06-08 DIAGNOSIS — E2839 Other primary ovarian failure: Secondary | ICD-10-CM

## 2018-06-08 DIAGNOSIS — Z1231 Encounter for screening mammogram for malignant neoplasm of breast: Secondary | ICD-10-CM | POA: Diagnosis not present

## 2018-06-08 DIAGNOSIS — M8589 Other specified disorders of bone density and structure, multiple sites: Secondary | ICD-10-CM | POA: Diagnosis not present

## 2018-06-08 IMAGING — MG DIGITAL SCREENING BILATERAL MAMMOGRAM WITH TOMO AND CAD
6 of 10 series · 6 of 30 positions shown · non-contrast
Comparison: Previous exam(s).

CLINICAL DATA: Screening.

EXAM:
DIGITAL SCREENING BILATERAL MAMMOGRAM WITH TOMO AND CAD

[R MLO synth-2D]
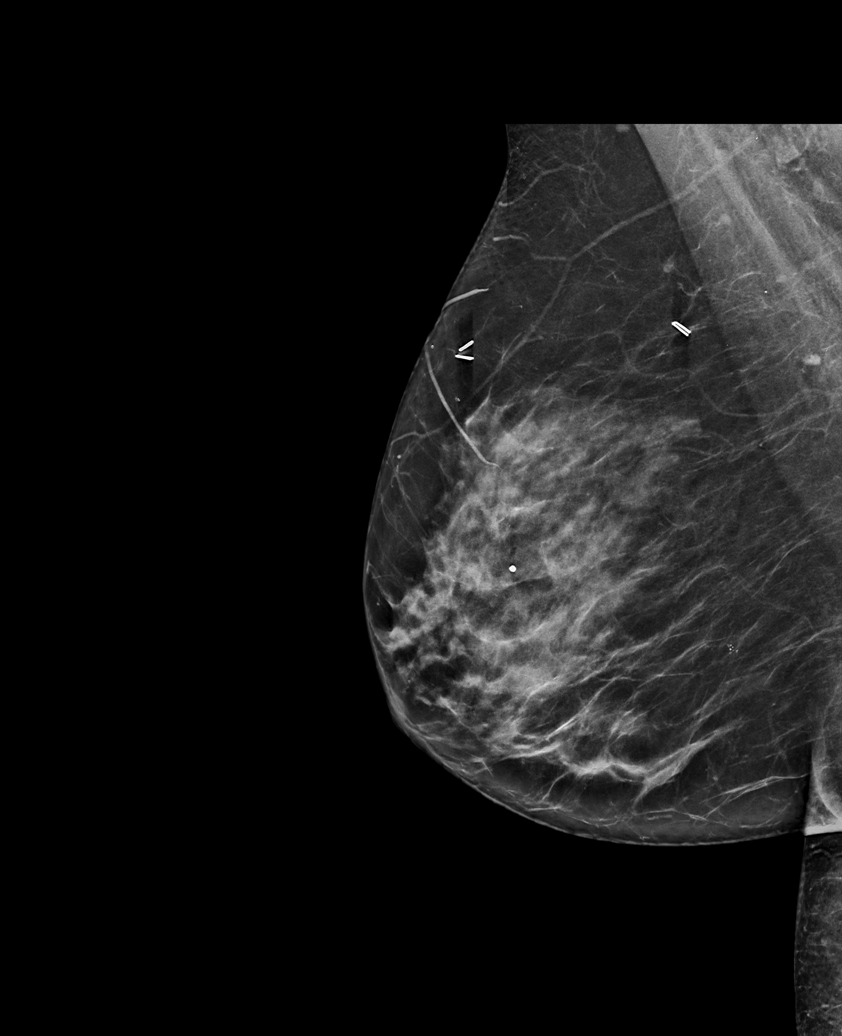

[L CC synth-2D]
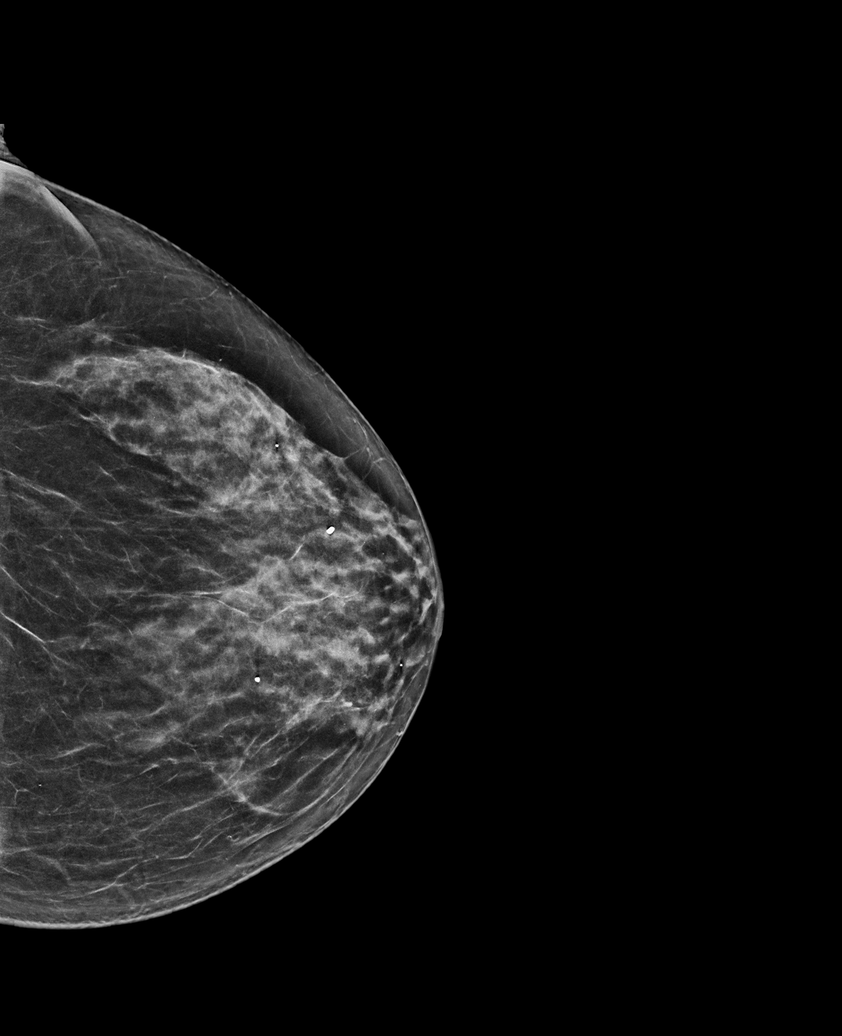

[R CC synth-2D]
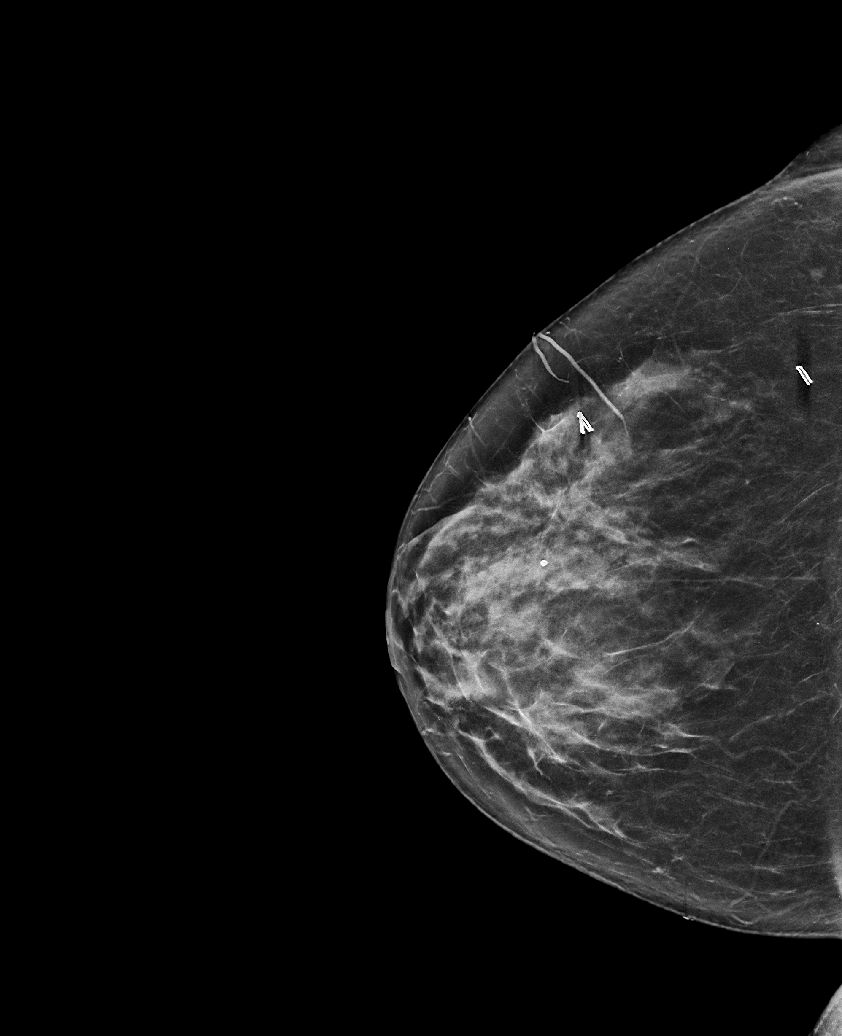

[L MLO synth-2D (1 of 2)]
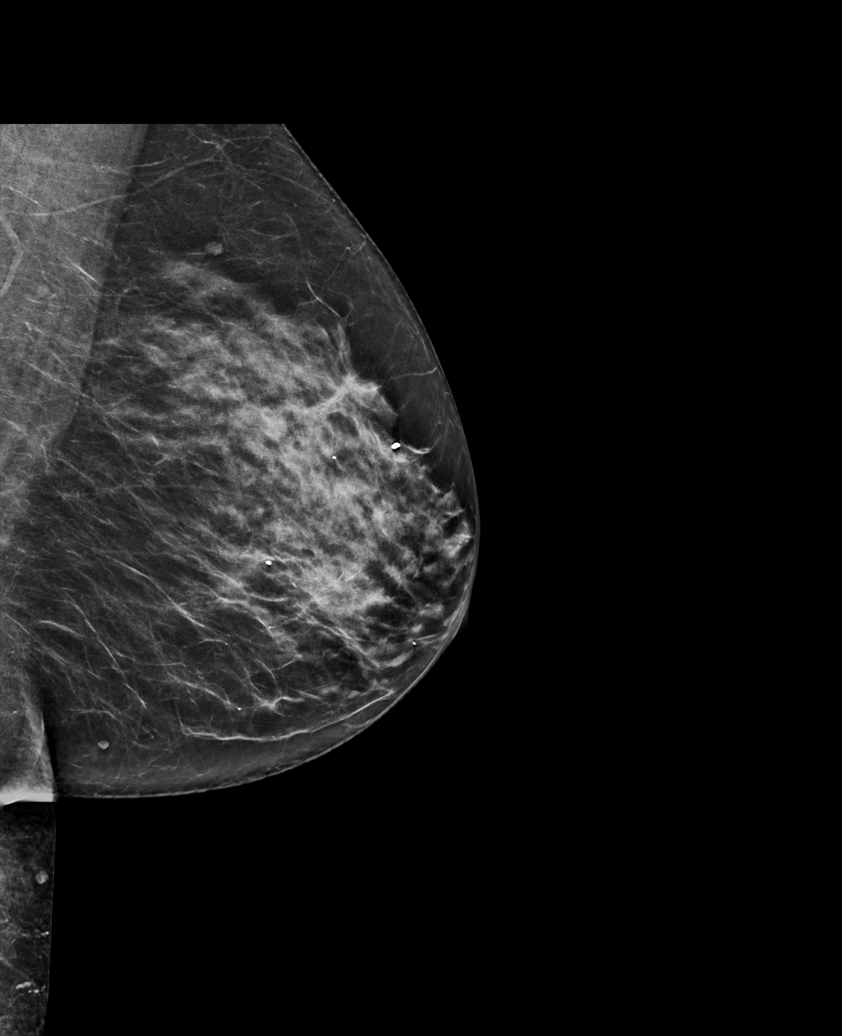

[L MLO synth-2D (2 of 2)]
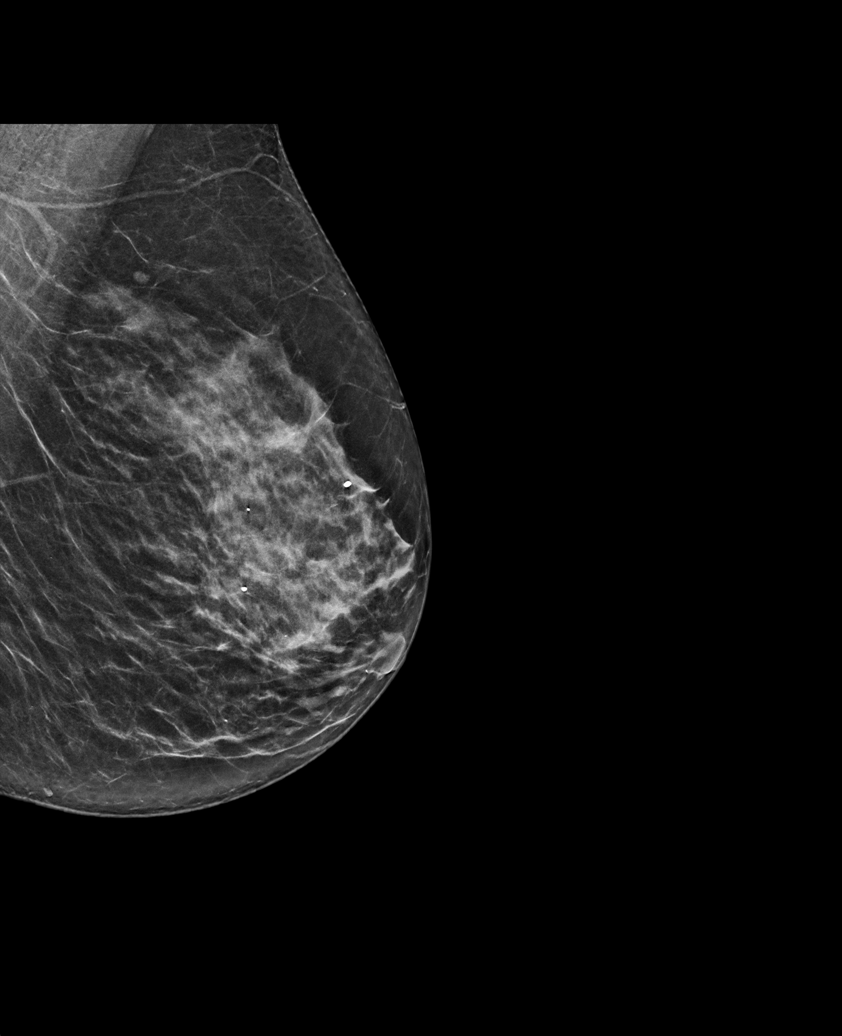

[L CC tomo · tomo slice 33/65.0]
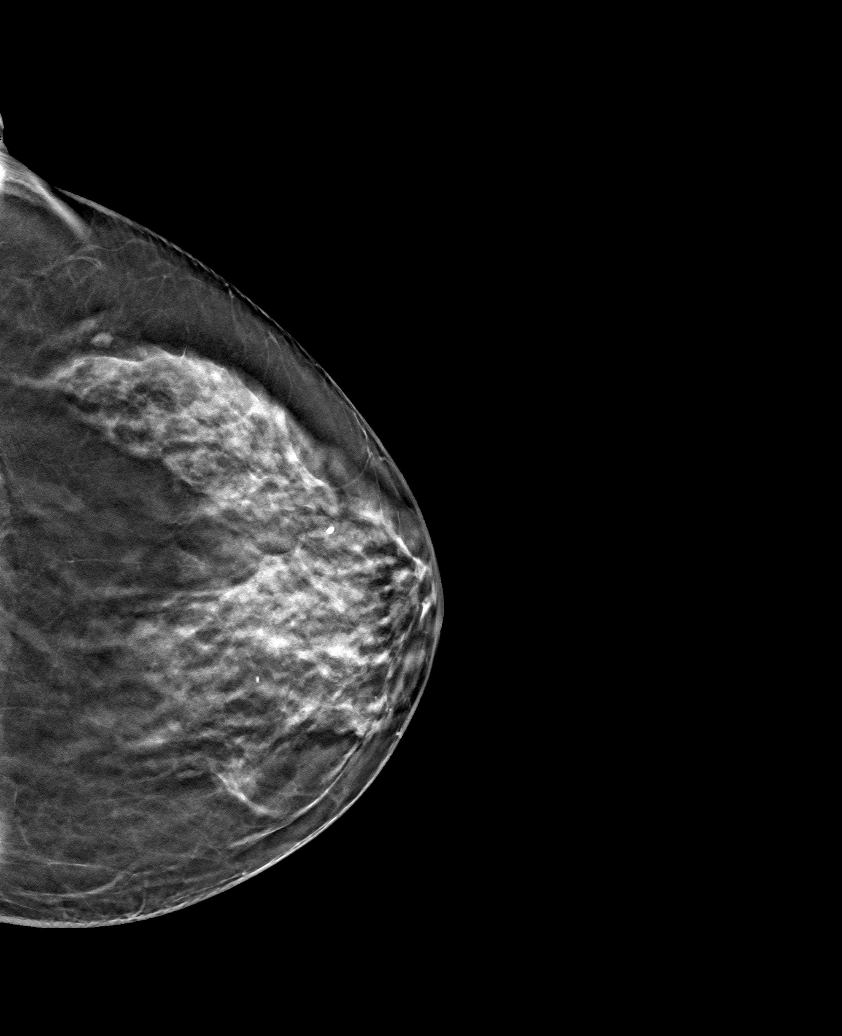

[6 of 30 positions shown; findings below may reference images not displayed]

ACR Breast Density Category c: The breast tissue is heterogeneously
dense, which may obscure small masses.
FINDINGS: There are no findings suspicious for malignancy. Images were
processed with CAD.
IMPRESSION: No mammographic evidence of malignancy. A result letter of this
screening mammogram will be mailed directly to the patient.

RECOMMENDATION:
Screening mammogram in one year. (Code:[5V])

BI-RADS CATEGORY  1: Negative.

## 2018-06-10 ENCOUNTER — Ambulatory Visit (INDEPENDENT_AMBULATORY_CARE_PROVIDER_SITE_OTHER): Payer: Medicare HMO | Admitting: Emergency Medicine

## 2018-06-10 ENCOUNTER — Other Ambulatory Visit: Payer: Self-pay

## 2018-06-10 ENCOUNTER — Encounter: Payer: Self-pay | Admitting: Emergency Medicine

## 2018-06-10 VITALS — BP 121/77 | HR 72 | Temp 99.1°F | Resp 18 | Ht 61.5 in | Wt 148.6 lb

## 2018-06-10 DIAGNOSIS — J329 Chronic sinusitis, unspecified: Secondary | ICD-10-CM | POA: Diagnosis not present

## 2018-06-10 DIAGNOSIS — G43709 Chronic migraine without aura, not intractable, without status migrainosus: Secondary | ICD-10-CM | POA: Diagnosis not present

## 2018-06-10 MED ORDER — TRAMADOL HCL 50 MG PO TABS: 50.0000 mg | ORAL_TABLET | Freq: Three times a day (TID) | ORAL | 0 refills | Status: DC | PRN

## 2018-06-10 NOTE — Patient Instructions (Addendum)
   If you have lab work done today you will be contacted with your lab results within the next 2 weeks.  If you have not heard from us then please contact us. The fastest way to get your results is to register for My Chart.   IF you received an x-ray today, you will receive an invoice from [facility]. Please contact [facility] at [phone] with questions or concerns regarding your invoice.   IF you received labwork today, you will receive an invoice from [facility]. Please contact [facility] at [phone] with questions or concerns regarding your invoice.   Our billing staff will not be able to assist you with questions regarding bills from these companies.  You will be contacted with the lab results as soon as they are available. The fastest way to get your results is to activate your My Chart account. Instructions are located on the last page of this paperwork. If you have not heard from us regarding the results in 2 weeks, please contact this office.     Migraine Headache  A migraine headache is a very strong throbbing pain on one side or both sides of your head. Migraines can also cause other symptoms. Talk with your doctor about what things may bring on (trigger) your migraine headaches. Follow these instructions at home: Medicines  Take over-the-counter and prescription medicines only as told by your doctor.  Do not drive or use heavy machinery while taking prescription pain medicine.  To prevent or treat constipation while you are taking prescription pain medicine, your doctor may recommend that you: ? Drink enough fluid to keep your pee (urine) clear or pale yellow. ? Take over-the-counter or prescription medicines. ? Eat foods that are high in fiber. These include fresh fruits and vegetables, whole grains, and beans. ? Limit foods that are high in fat and processed sugars. These include fried and sweet foods. Lifestyle  Avoid alcohol.  Do not use any  products that contain nicotine or tobacco, such as cigarettes and e-cigarettes. If you need help quitting, ask your doctor.  Get at least 8 hours of sleep every night.  Limit your stress. General instructions   Keep a journal to find out what may bring on your migraines. For example, write down: ? What you eat and drink. ? How much sleep you get. ? Any change in what you eat or drink. ? Any change in your medicines.  If you have a migraine: ? Avoid things that make your symptoms worse, such as bright lights. ? It may help to lie down in a dark, quiet room. ? Do not drive or use heavy machinery. ? Ask your doctor what activities are safe for you.  Keep all follow-up visits as told by your doctor. This is important. Contact a doctor if:  You get a migraine that is different or worse than your usual migraines. Get help right away if:  Your migraine gets very bad.  You have a fever.  You have a stiff neck.  You have trouble seeing.  Your muscles feel weak or like you cannot control them.  You start to lose your balance a lot.  You start to have trouble walking.  You pass out (faint). This information is not intended to replace advice given to you by your health care provider. Make sure you discuss any questions you have with your health care provider. Document Released: 01/01/2008 Document Revised: 12/16/2017 Document Reviewed: 09/10/2015 Elsevier Interactive Patient Education  2019 [employer]  

## 2018-06-10 NOTE — Progress Notes (Signed)
Caroline [name] 68 y.o.   Chief Complaint  Patient presents with  . Migraine    years getting worse and more frequent  . Back Pain    lower back pain from fall on monday     HISTORY OF PRESENT ILLNESS: This is a 68 y.o. female complaining of several things: 1.  Chronic migraine headaches getting worse over the past 6 months, almost daily now.  Imitrex only partially helps. 2.  Chronic sinus problems, may be contributing to her migraine headaches. 3.  Larey Seat off a ladder last Monday and hurt right lower back, had tingling down left leg following day, better today. No other complaints or medical concerns today.  HPI   Prior to Admission medications   Medication Sig Start Date End Date Taking? Authorizing Provider  buPROPion (WELLBUTRIN SR) 100 MG 12 hr tablet Take 1 tablet (100 mg total) by mouth daily. 04/20/18  Yes Myles Lipps, MD  cefixime (SUPRAX) 400 MG CAPS capsule Take 1 capsule (400 mg total) by mouth daily. 04/10/18  Yes Myles Lipps, MD  fluticasone (FLONASE) 50 MCG/ACT nasal spray Place 1 spray into both nostrils 2 (two) times daily. 03/24/18  Yes Myles Lipps, MD  metoprolol succinate (TOPROL-XL) 100 MG 24 hr tablet TAKE 1 TABLET EVERY DAY. TAKE WITH OR IMMEDIATELY FOLLOWING A MEAL. 03/11/18  Yes Myles Lipps, MD  pravastatin (PRAVACHOL) 40 MG tablet Take 1 tablet (40 mg total) by mouth daily. 03/11/18  Yes Myles Lipps, MD  promethazine (PHENERGAN) 25 MG tablet Take 1 tablet (25 mg total) by mouth every 6 (six) hours as needed for nausea or vomiting. 12/28/16  Yes Charlynne Pander, MD  sertraline (ZOLOFT) 100 MG tablet Take 1.5 tablets (150 mg total) by mouth daily. 03/11/18  Yes Myles Lipps, MD  SUMAtriptan (IMITREX) 100 MG tablet TAKE 1 TABLET BY MOUTH ONCE DAILY MAY  REPEAT  IN  2  HOURS  IF  HEADACHE  PERSISTS 03/11/18  Yes Myles Lipps, MD    Allergies  Allergen Reactions  . Tetracyclines & Related Nausea And Vomiting  . Vicodin  [Hydrocodone-Acetaminophen] Nausea And Vomiting    Patient Active Problem List   Diagnosis Date Noted  . Essential hypertension, benign 10/22/2017  . Benign hematuria 06/17/2017  . Pure hypercholesterolemia 08/11/2016  . Anxiety and depression 08/11/2016  . Chronic migraine without aura without status migrainosus, not intractable 08/11/2016    Past Medical History:  Diagnosis Date  . Allergy    Zyrtec, Benadryl  . Anxiety    Zoloft since several years  . Cataract   . Hyperlipidemia   . Hypertension   . Migraines    topmax and Imitrex    Past Surgical History:  Procedure Laterality Date  . ABDOMINAL HYSTERECTOMY     uterine prolapse; ovaries INTACT  . BLADDER REPAIR    . BREAST BIOPSY    . BREAST EXCISIONAL BIOPSY    . BREAST SURGERY     breast bx x 2 in 1980s; benign    Social History   Socioeconomic History  . Marital status: Married    Spouse name: Not on file  . Number of children: 2  . Years of education: Not on file  . Highest education level: Not on file  Occupational History  . Occupation: employed  Engineer, production  . Financial resource strain: Not on file  . Food insecurity:    Worry: Not on file    Inability: Not on file  .  Transportation needs:    Medical: Not on file    Non-medical: Not on file  Tobacco Use  . Smoking status: Never Smoker  . Smokeless tobacco: Never Used  Substance and Sexual Activity  . Alcohol use: Yes    Comment: rarely  . Drug use: No  . Sexual activity: Yes    Birth control/protection: Post-menopausal, Surgical  Lifestyle  . Physical activity:    Days per week: Not on file    Minutes per session: Not on file  . Stress: Not on file  Relationships  . Social connections:    Talks on phone: Not on file    Gets together: Not on file    Attends religious service: Not on file    Active member of club or organization: Not on file    Attends meetings of clubs or organizations: Not on file    Relationship status: Not on  file  . Intimate partner violence:    Fear of current or ex partner: Not on file    Emotionally abused: Not on file    Physically abused: Not on file    Forced sexual activity: Not on file  Other Topics Concern  . 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      Alcohol: rare      Exercise: none    Family History  Problem Relation Age of Onset  . Dementia Mother   . Cancer Father 62       oral cancer  . Heart disease Father        PAD/femoral bypass  . Hypertension Father   . Hyperlipidemia Father   . Alcohol abuse Father   . Hyperlipidemia Sister   . Hypertension Sister   . Breast cancer Maternal Aunt      Review of Systems  Constitutional: Negative.  Negative for chills and fever.  HENT:       Chronic sinus problems  Respiratory: Negative.  Negative for shortness of breath.   Cardiovascular: Negative.  Negative for chest pain and palpitations.  Gastrointestinal: Negative.  Negative for abdominal pain, nausea and vomiting.  Genitourinary: Negative.   Musculoskeletal: Positive for back pain.  Skin: Negative.  Negative for rash.  Neurological: Positive for headaches.  Endo/Heme/Allergies: Negative.   All other systems reviewed and are negative.   Vitals:   06/10/18 1627  BP: 121/77  Pulse: 72  Resp: 18  Temp: 99.1 F (37.3 C)  SpO2: 97%    Physical Exam Vitals signs reviewed.  Constitutional:      Appearance: Normal appearance.  HENT:     Head: Normocephalic and atraumatic.     Right Ear: Tympanic membrane, ear canal and external ear normal.     Left Ear: Tympanic membrane, ear canal and external ear normal.     Nose: Nose normal.     Mouth/Throat:     Mouth: Mucous membranes are moist.     Pharynx: Oropharynx  is clear.  Eyes:     Extraocular Movements: Extraocular movements intact.     Conjunctiva/sclera: Conjunctivae normal.     Pupils: Pupils are equal, round, and reactive to light.  Neck:     Musculoskeletal: Normal range of motion  and neck supple.  Cardiovascular:     Rate and Rhythm: Normal rate and regular rhythm.     Heart sounds: Normal heart sounds.  Pulmonary:     Effort: Pulmonary effort is normal.     Breath sounds: Normal breath sounds.  Musculoskeletal: Normal range of motion.        General: No tenderness.     Comments: Positive bruise to right lumbar area.  No bony tenderness.  Lymphadenopathy:     Cervical: No cervical adenopathy.  Skin:    General: Skin is warm and dry.     Capillary Refill: Capillary refill takes less than 2 seconds.  Neurological:     General: No focal deficit present.     Mental Status: She is alert and oriented to person, place, and time.     Sensory: No sensory deficit.     Motor: No weakness.     Coordination: Coordination normal.     Gait: Gait normal.     Deep Tendon Reflexes: Reflexes normal.  Psychiatric:        Mood and Affect: Mood normal.        Behavior: Behavior normal.    A total of 25 minutes was spent in the room with the patient, greater than 50% of which was in counseling/coordination of care regarding chronic medical problems, treatment, medications, prognosis, need for neurology and ENT evaluations, and need for follow-up after that.   ASSESSMENT & PLAN: Kanisha was seen today for migraine and back pain.  Diagnoses and all orders for this visit:  Chronic migraine without aura without status migrainosus, not intractable -     traMADol (ULTRAM) 50 MG tablet; Take 1 tablet (50 mg total) by mouth every 8 (eight) hours as needed. -     Ambulatory referral to Neurology  Chronic sinusitis, unspecified location -     Ambulatory referral to ENT    Patient Instructions       If you have lab work done today you will be  contacted with your lab results within the next 2 weeks.  If you have not heard from [place] then please contact us. The fastest way to get your results is to register for My Chart.   IF you received an x-ray today, you will receive an invoice from [facility]. Please contact [facility] at [phone] with questions or concerns regarding your invoice.   IF you received labwork today, you will receive an invoice from [facility]. Please contact [facility] at [phone] with questions or concerns regarding your invoice.   Our billing staff will not be able to assist you with questions regarding bills from these companies.  You will be contacted with the lab results as soon as they are available. The fastest way to get your results is to activate your My Chart account. Instructions are located on the last page of this paperwork. If you have not heard from [place] regarding the results in 2 weeks, please contact this office.     Migraine Headache  A migraine headache is a very strong throbbing pain on one side or both sides of your head. Migraines can also cause other symptoms. Talk with your doctor about what things may bring on (trigger) your migraine headaches. Follow these instructions at home: Medicines  Take over-the-counter and prescription medicines only as told by your doctor.  Do not drive or use heavy machinery while taking prescription pain medicine.  To prevent or treat constipation while you are taking prescription pain medicine,  your doctor may recommend that you: ? Drink enough fluid to keep your pee (urine) clear or pale yellow. ? Take over-the-counter or prescription medicines. ? Eat foods that are high in fiber. These include fresh fruits and vegetables, whole grains, and beans. ? Limit foods that are high in fat and processed sugars. These include fried and sweet foods. Lifestyle  Avoid alcohol.  Do not use any products that contain nicotine or tobacco, such as  cigarettes and e-cigarettes. If you need help quitting, ask your doctor.  Get at least 8 hours of sleep every night.  Limit your stress. General instructions   Keep a journal to find out what may bring on your migraines. For example, write down: ? What you eat and drink. ? How much sleep you get. ? Any change in what you eat or drink. ? Any change in your medicines.  If you have a migraine: ? Avoid things that make your symptoms worse, such as bright lights. ? It may help to lie down in a dark, quiet room. ? Do not drive or use heavy machinery. ? Ask your doctor what activities are safe for you.  Keep all follow-up visits as told by your doctor. This is important. Contact a doctor if:  You get a migraine that is different or worse than your usual migraines. Get help right away if:  Your migraine gets very bad.  You have a fever.  You have a stiff neck.  You have trouble seeing.  Your muscles feel weak or like you cannot control them.  You start to lose your balance a lot.  You start to have trouble walking.  You pass out (faint). This information is not intended to replace advice given to you by your health care provider. Make sure you discuss any questions you have with your health care provider. Document Released: 01/01/2008 Document Revised: 12/16/2017 Document Reviewed: 09/10/2015 Elsevier Interactive Patient Education  2019 Elsevier Inc.      Edwina Barth, MD [facility]

## 2018-06-14 ENCOUNTER — Encounter: Payer: Self-pay | Admitting: Neurology

## 2018-06-18 ENCOUNTER — Telehealth: Payer: Self-pay | Admitting: Emergency Medicine

## 2018-06-18 NOTE — Telephone Encounter (Signed)
Dr. Ezzard Standing was OON for patient resent referral to Dr. Suszanne Conners [phone] if she has any questions

## 2018-06-23 DIAGNOSIS — J343 Hypertrophy of nasal turbinates: Secondary | ICD-10-CM | POA: Diagnosis not present

## 2018-06-23 DIAGNOSIS — H6121 Impacted cerumen, right ear: Secondary | ICD-10-CM | POA: Diagnosis not present

## 2018-06-23 DIAGNOSIS — J310 Chronic rhinitis: Secondary | ICD-10-CM | POA: Diagnosis not present

## 2018-06-23 DIAGNOSIS — J342 Deviated nasal septum: Secondary | ICD-10-CM | POA: Diagnosis not present

## 2018-07-06 ENCOUNTER — Other Ambulatory Visit: Payer: Self-pay | Admitting: Otolaryngology

## 2018-07-06 DIAGNOSIS — J329 Chronic sinusitis, unspecified: Secondary | ICD-10-CM

## 2018-07-20 ENCOUNTER — Telehealth: Payer: Self-pay | Admitting: Family Medicine

## 2018-07-20 ENCOUNTER — Other Ambulatory Visit: Payer: Self-pay

## 2018-07-20 MED ORDER — SUMATRIPTAN SUCCINATE 100 MG PO TABS: ORAL_TABLET | ORAL | 1 refills | Status: DC

## 2018-07-20 NOTE — Telephone Encounter (Signed)
Copied from CRM [id number]. Topic: Quick Communication - Rx Refill/Question >> Jul 20, 2018 12:17 PM Laural Benes, [place] C wrote: Medication: SUMAtriptan (IMITREX) 100 MG tablet  Has the patient contacted their pharmacy? No  (Agent: If no, request that the patient contact the pharmacy for the refill.) (Agent: If yes, when and what did the pharmacy advise?)  Preferred Pharmacy (with phone number or street name): [facility] [address] [place] - [address] [phone] (Phone) [phone] (Fax)    Agent: Please be advised that RX refills may take up to 3 business days. We ask that you follow-up with your pharmacy.

## 2018-08-02 ENCOUNTER — Other Ambulatory Visit: Payer: Self-pay | Admitting: Family Medicine

## 2018-08-23 ENCOUNTER — Other Ambulatory Visit: Payer: Self-pay

## 2018-08-23 ENCOUNTER — Ambulatory Visit
Admission: RE | Admit: 2018-08-23 | Discharge: 2018-08-23 | Disposition: A | Discharge status: Home / Self Care | Payer: Medicare HMO | Source: Ambulatory Visit | Attending: Otolaryngology | Admitting: Otolaryngology

## 2018-08-23 DIAGNOSIS — J329 Chronic sinusitis, unspecified: Secondary | ICD-10-CM

## 2018-08-23 DIAGNOSIS — R51 Headache: Secondary | ICD-10-CM | POA: Diagnosis not present

## 2018-08-23 IMAGING — CT CT MAXILLOFACIAL WITHOUT CONTRAST
1 series · 14 of 30 positions shown, 18 images · non-contrast
Comparison: CTA head and neck [DATE].

CLINICAL DATA: 68-year-old female with chronic recurrent sinus
pain, headaches.

EXAM:
CT MAXILLOFACIAL WITHOUT CONTRAST
TECHNIQUE: Multidetector CT images of the paranasal sinuses were obtained using
the standard protocol without intravenous contrast.

[Series 4: soft tissue · axial · 0.36mm/px · z∈[-136,-20]mm · 14 of 130 slices shown, 18 images]
[im 9/130  brain]
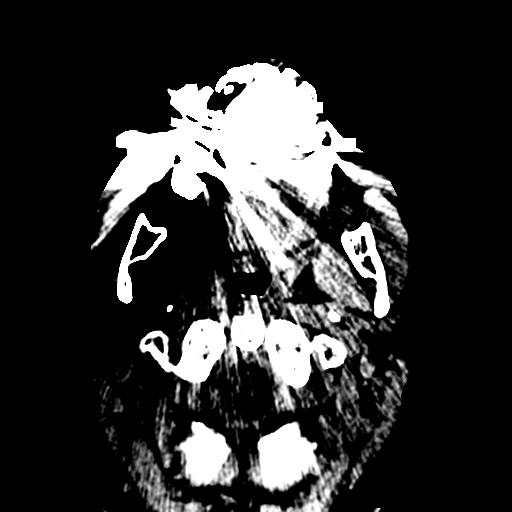
[im 9/130  bone]
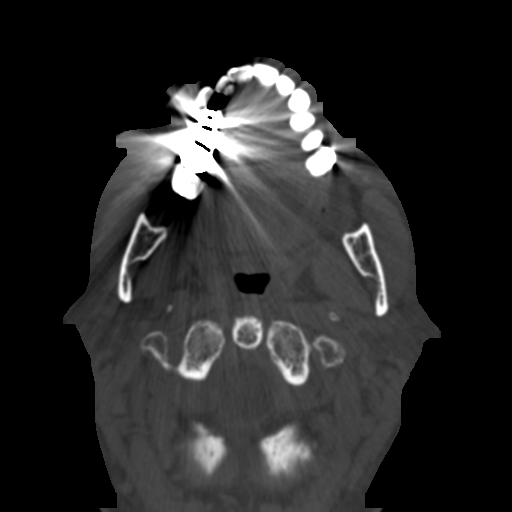
[im 18/130  bone]
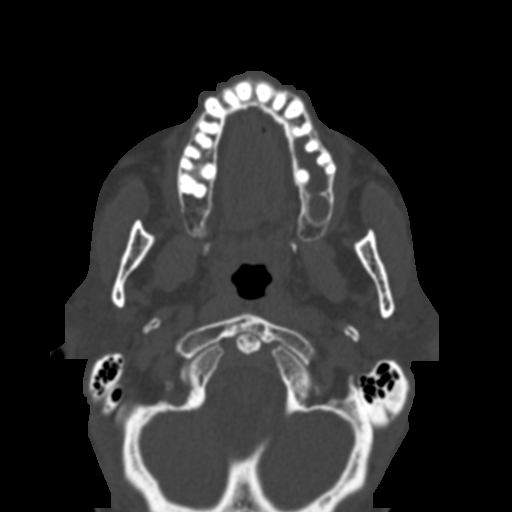
[im 27/130  bone]
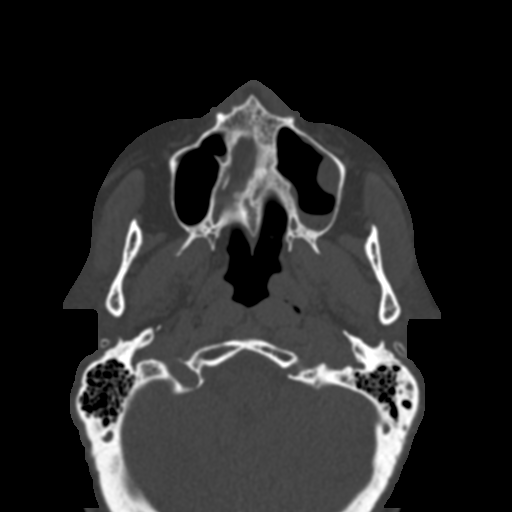
[im 36/130  bone]
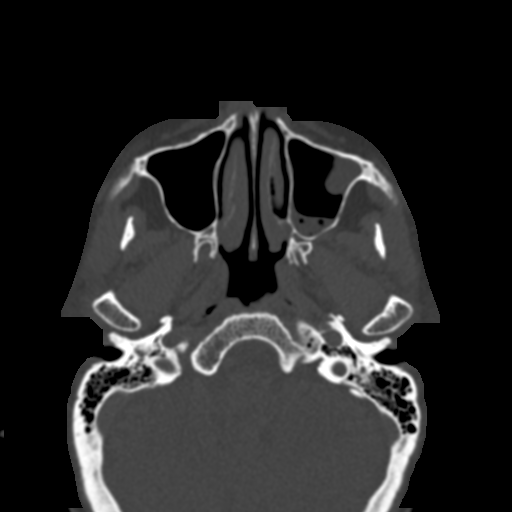
[im 45/130  brain]
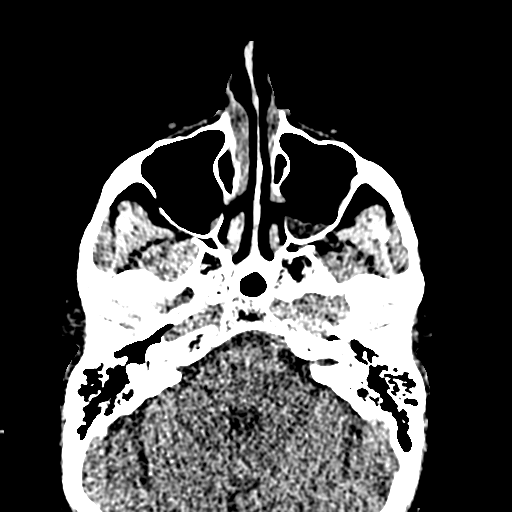
[im 45/130  bone]
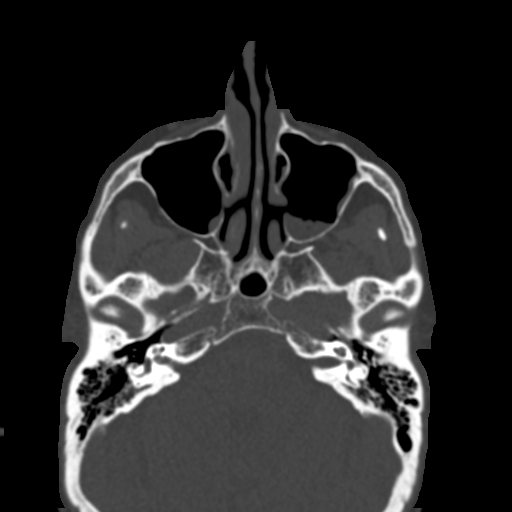
[im 54/130  bone]
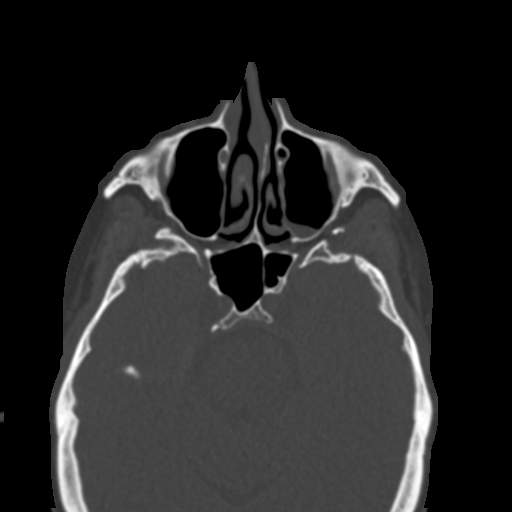
[im 63/130  bone]
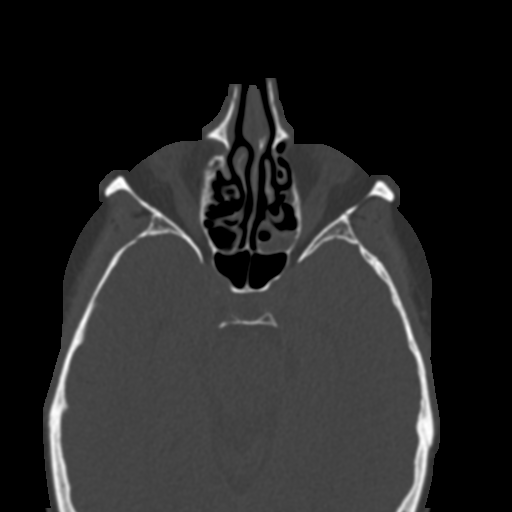
[im 72/130  bone]
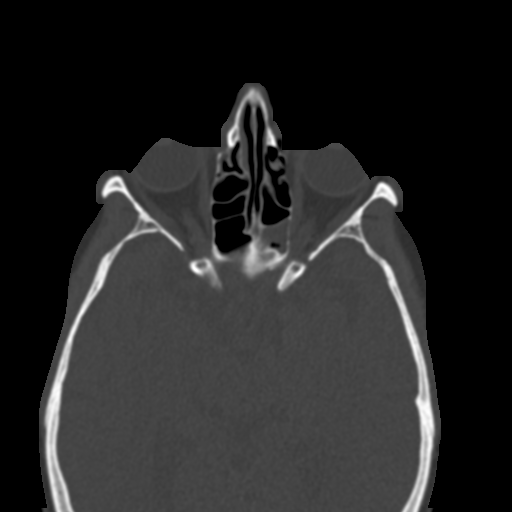
[im 81/130  brain]
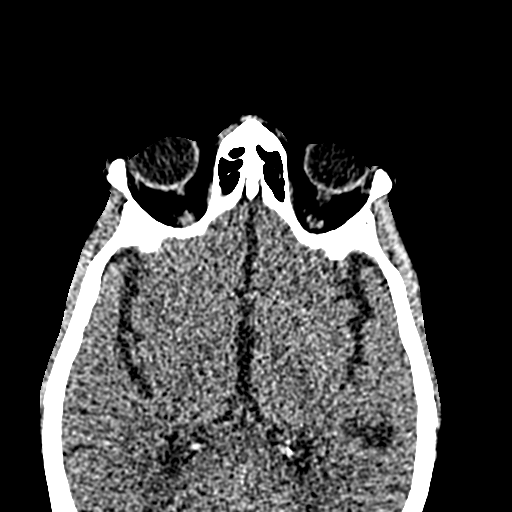
[im 81/130  bone]
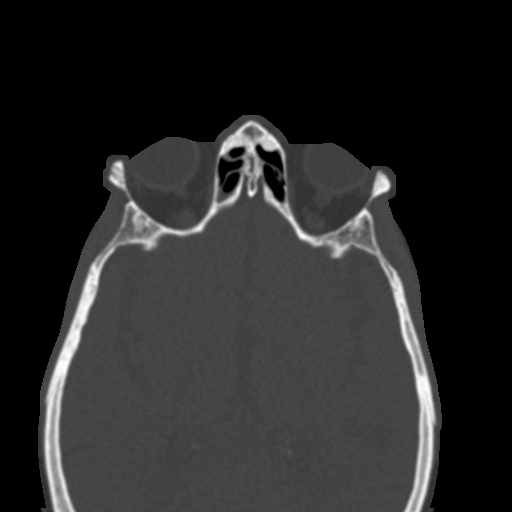
[im 89/130  bone]
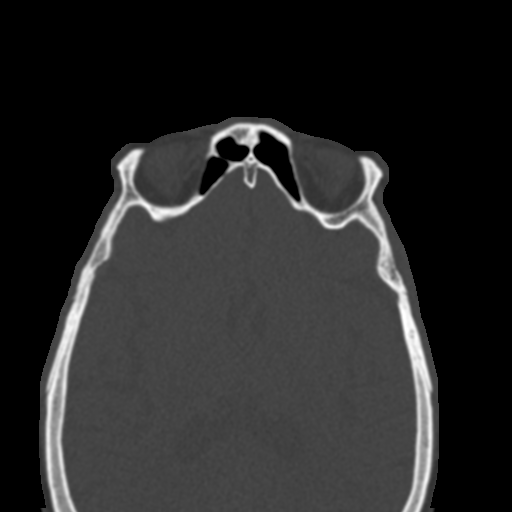
[im 98/130  bone]
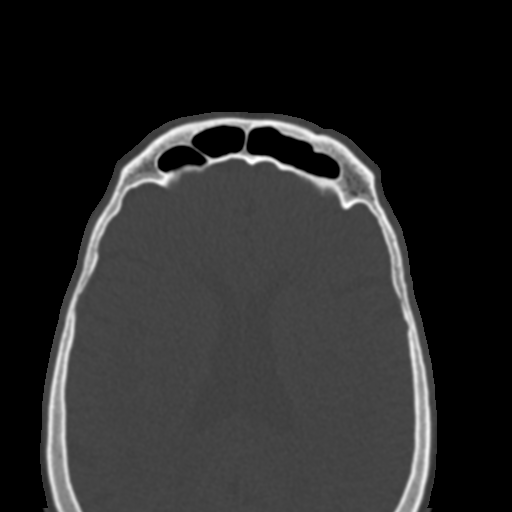
[im 107/130  bone]
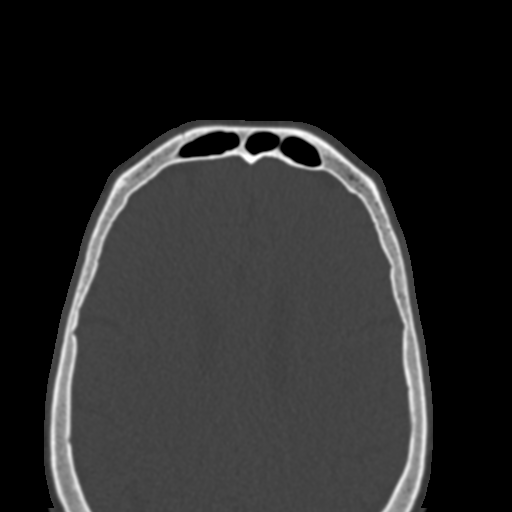
[im 116/130  brain]
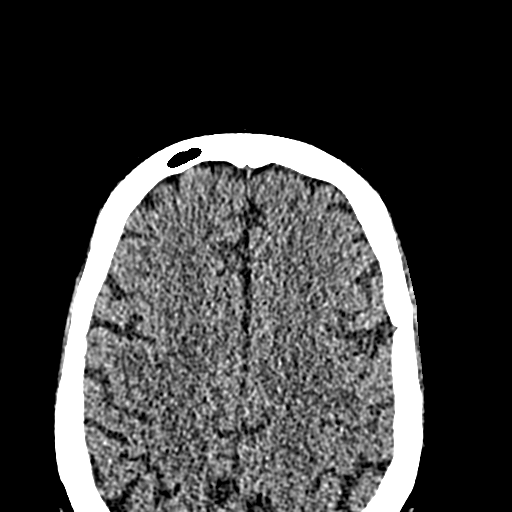
[im 116/130  bone]
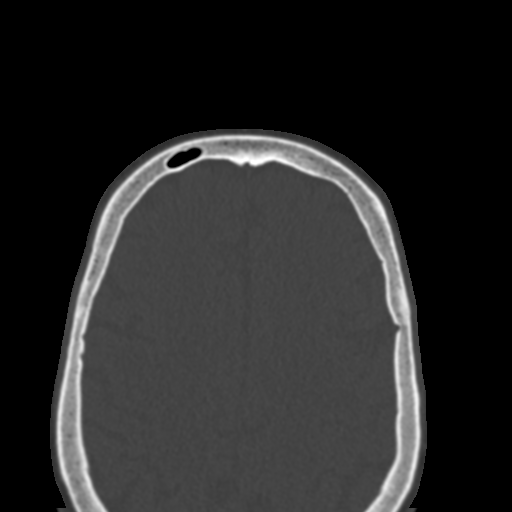
[im 125/130  bone]
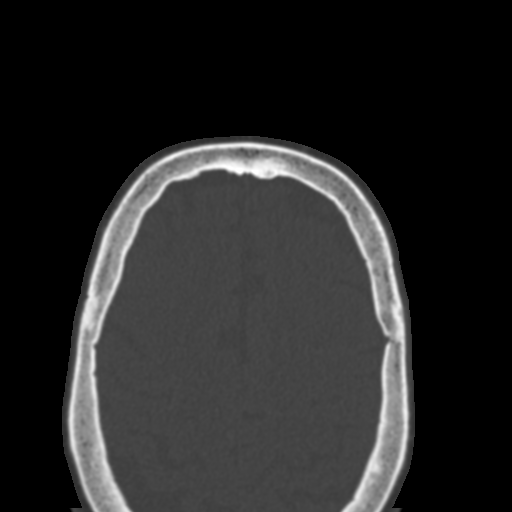

[14 of 30 positions shown; findings below may reference images not displayed]

FINDINGS: Paranasal sinuses:

Frontal: Mildly hyperplastic. Normally aerated. Patent frontal sinus
drainage pathways.

Ethmoid: Chronic opacification of the left posterior ethmoid is
stable since [5L]. Minor ethmoid mucosal thickening elsewhere.

Maxillary: Small left maxillary sinus fluid level with bubbly
opacity and superimposed mildly lobulated peripheral and alveolar
recess mucosal thickening. Probable superimposed chronic mucous
retention cyst on series 3, image 32.

Minimal right maxillary sinus mucosal thickening mostly in the
alveolar recess.

Sphenoid: Normally aerated. Patent sphenoethmoidal recesses.

Right ostiomeatal unit: Patent on coronal image 33. Accessory
drainage pathway also on image 38.

Left ostiomeatal unit: Patent despite mucosal thickening on image
33. Accessory drainage also on image 38.

Nasal passages: Intact nasal septum. Mild leftward septal deviation
is stable since [5L]. Symmetric but mild bilateral nasal cavity
mucosal thickening. Olfactory recesses remain clear. No bubbly
opacity or retained secretions.

Anatomy:

Anterior ethmoidal artery position suspected on coronal image 37
with adjacent bilateral frontal sinus pneumatization.

Keros type 2 olfactory fossa.

Accessory bilateral maxillary sinus drainage pathways on coronal
image 38.

Partially pneumatized right anterior clinoid process on coronal
image 50.

Other: Visible noncontrast brain parenchyma appears stable since
[5L], mild nonspecific white matter hypodensity. Dominant right
vertebral artery.

Negative visible noncontrast deep soft tissue spaces of the face,
orbits and scalp.

Bilateral tympanic cavities and mastoids are clear. No acute
maxillary dentition finding. No acute osseous abnormality
identified.
IMPRESSION: 1. Mildly hyperplastic paranasal sinuses. Accessory maxillary sinus
drainage pathways incidentally noted.
2. Acute left maxillary sinusitis. Chronic left posterior ethmoid
opacification. Minor mucosal thickening otherwise.
3. Symmetric but mild nasal cavity mucosal thickening raising the
possibility of rhinitis. Mild leftward septal deviation.

## 2018-08-27 ENCOUNTER — Ambulatory Visit: Payer: Medicare HMO | Admitting: Neurology

## 2018-08-27 DIAGNOSIS — H2512 Age-related nuclear cataract, left eye: Secondary | ICD-10-CM | POA: Diagnosis not present

## 2018-09-10 DIAGNOSIS — H2511 Age-related nuclear cataract, right eye: Secondary | ICD-10-CM | POA: Diagnosis not present

## 2018-09-10 DIAGNOSIS — H25011 Cortical age-related cataract, right eye: Secondary | ICD-10-CM | POA: Diagnosis not present

## 2018-09-22 DIAGNOSIS — J322 Chronic ethmoidal sinusitis: Secondary | ICD-10-CM | POA: Diagnosis not present

## 2018-09-22 DIAGNOSIS — J310 Chronic rhinitis: Secondary | ICD-10-CM | POA: Diagnosis not present

## 2018-09-22 DIAGNOSIS — J320 Chronic maxillary sinusitis: Secondary | ICD-10-CM | POA: Diagnosis not present

## 2018-09-22 DIAGNOSIS — J342 Deviated nasal septum: Secondary | ICD-10-CM | POA: Diagnosis not present

## 2018-10-01 ENCOUNTER — Other Ambulatory Visit: Payer: Self-pay | Admitting: Family Medicine

## 2018-10-01 NOTE — Telephone Encounter (Signed)
Forwarding medication refills to PCP for review. 

## 2018-10-11 NOTE — Progress Notes (Addendum)
Virtual Visit via Video Note The purpose of this virtual visit is to provide medical care while limiting exposure to the novel coronavirus.    Consent was obtained for video visit:  Yes.   Answered questions that patient had about telehealth interaction:  Yes.   I discussed the limitations, risks, security and privacy concerns of performing an evaluation and management service by telemedicine. I also discussed with the patient that there may be a patient responsible charge related to this service. The patient expressed understanding and agreed to proceed.  Pt location: Home Physician Location: office Name of referring provider:  Horald Pollen, * I connected with Caroline [name] at patients initiation/request on 10/12/2018 at  9:10 AM EDT by video enabled telemedicine application and verified that I am speaking with the correct person using two identifiers. Pt MRN:  [id number] Pt DOB:  1950/06/27 Video Participants:  Caroline [name]   History of Present Illness:  Caroline [name] is a 68 year old woman who presents for migraines.  History supplemented by referring provider note.  Onset:  Teenager.  Over past few years, they have been more frequent. Location:  Starts above right eye and radiated to top of head on the right and across back of neck Quality:  At first an ache above the eye, then a throbbing on the head Intensity:  8/10.  She denies new headache, thunderclap headache or severe headache that wakes her from sleep. Aura:  No Premonitory Phase:  none Postdrome:  Hangover effect, scalp tenderness Associated symptoms:  Scalp allodynia, sometimes nausea, sometimes vomiting, phonophobia, photophobia, osmophobia.  Once in a while, her vision seems "funny".  She denies associated autonomic symptoms or unilateral numbness or weakness. Duration:  3 to 4 hours untreated, 60-90 minutes treated Frequency:  3 to 4 days a week Frequency of abortive medication: 3 to 4 days a week Triggers:   Processed meats, sweets, loud noise, change in barometric pressure, alcohol, anesthesia.   Aggravating factors:  Sometimes laying down Relieving factors:  Take medication and rest Activity:  Aggravates.  CT maxillofacial from 08/23/18 demonstrated acute left maxillary sinusitis CTA of head and neck from 12/28/16 were personally reviewed and were negative.  Rescue protocol:  First line Excedrin Migraine, Second line Sumatriptan.  Rarely tramadol as third line. Current NSAIDS:  none Current analgesics:  Excedrin Migraine, tramadol (rarely for headache) Current triptans:  Sumatriptan 100mg  Current ergotamine:  none Current anti-emetic:  Promethazine 25mg  Current muscle relaxants:  none Current anti-anxiolytic:  none Current sleep aide:  none Current Antihypertensive medications:  Toprol XL 100mg  Current Antidepressant medications:  Sertraline 150mg , Wellbutrin Current Anticonvulsant medications:  none Current anti-CGRP:  none Current Vitamins/Herbal/Supplements:  none Current Antihistamines/Decongestants:  Flonase, Zyrtec Other therapy:  none Hormone/birth control:  none  Past NSAIDS:  Ibuprofen, naproxen Past analgesics:  Fioricet Past abortive triptans:  Maxalt Past abortive ergotamine:  none Past muscle relaxants:  none Past anti-emetic:  Zofran Past antihypertensive medications:  Possibly Inderal Past antidepressant medications:  amitriptyline Past anticonvulsant medications:  topiramate Past anti-CGRP:  none Past vitamins/Herbal/Supplements:  none Past antihistamines/decongestants:  none Other past therapies:  Chiropractic medicine  Caffeine:  No coffee.   Diet:  Tries to hydrates.  Sometimes soda Exercise:  no Depression:  controlled; Anxiety:  controlled Other pain:  Low back aches Sleep hygiene:  Overall okay.   Family history of headache:  Mom, sons  Past Medical History: Past Medical History:  Diagnosis Date  . Allergy    Zyrtec, Benadryl  .  Anxiety     Zoloft since several years  . Cataract   . Hyperlipidemia   . Hypertension   . Migraines    topmax and Imitrex    Medications: Outpatient Encounter Medications as of 10/12/2018  Medication Sig  . buPROPion (WELLBUTRIN SR) 100 MG 12 hr tablet Take 1 tablet (100 mg total) by mouth daily.  . cefixime (SUPRAX) 400 MG CAPS capsule Take 1 capsule (400 mg total) by mouth daily.  . fluticasone (FLONASE) 50 MCG/ACT nasal spray Place 1 spray into both nostrils 2 (two) times daily.  . metoprolol succinate (TOPROL-XL) 100 MG 24 hr tablet TAKE 1 TABLET EVERY DAY. TAKE WITH OR IMMEDIATELY FOLLOWING A MEAL.  . pravastatin (PRAVACHOL) 40 MG tablet TAKE 1 TABLET (40 MG TOTAL) BY MOUTH DAILY.  Marland Kitchen. promethazine (PHENERGAN) 25 MG tablet Take 1 tablet (25 mg total) by mouth every 6 (six) hours as needed for nausea or vomiting.  . sertraline (ZOLOFT) 100 MG tablet TAKE 1 AND 1/2 TABLETS EVERY DAY  . SUMAtriptan (IMITREX) 100 MG tablet TAKE 1 TABLET BY MOUTH ONCE DAILY MAY  REPEAT  IN  2  HOURS  IF  HEADACHE  PERSISTS  . traMADol (ULTRAM) 50 MG tablet Take 1 tablet (50 mg total) by mouth every 8 (eight) hours as needed.   No facility-administered encounter medications on file as of 10/12/2018.     Allergies: Allergies  Allergen Reactions  . Tetracyclines & Related Nausea And Vomiting  . Vicodin [Hydrocodone-Acetaminophen] Nausea And Vomiting    Family History: Family History  Problem Relation Age of Onset  . Dementia Mother   . Cancer Father [phone]       oral cancer  . Heart disease Father        PAD/femoral bypass  . Hypertension Father   . Hyperlipidemia Father   . Alcohol abuse Father   . Hyperlipidemia Sister   . Hypertension Sister   . Breast cancer Maternal Aunt     Social History: Social History   Socioeconomic History  . Marital status: Married    Spouse name: Not on file  . Number of children: 2  . Years of education: Not on file  . Highest education level: Not on file  Occupational  History  . Occupation: employed  Engineer, productionocial Needs  . Financial resource strain: Not on file  . Food insecurity    Worry: Not on file    Inability: Not on file  . Transportation needs    Medical: Not on file    Non-medical: Not on file  Tobacco Use  . Smoking status: Never Smoker  . Smokeless tobacco: Never Used  Substance and Sexual Activity  . Alcohol use: Yes    Comment: rarely  . Drug use: No  . Sexual activity: Yes    Birth control/protection: Post-menopausal, Surgical  Lifestyle  . Physical activity    Days per week: Not on file    Minutes per session: Not on file  . Stress: Not on file  Relationships  . Social Musicianconnections    Talks on phone: Not on file    Gets together: Not on file    Attends religious service: Not on file    Active member of club or organization: Not on file    Attends meetings of clubs or organizations: Not on file    Relationship status: Not on file  . Intimate partner violence    Fear of current or ex partner: Not on file    Emotionally  abused: Not on file    Physically abused: Not on file    Forced sexual activity: Not on file  Other Topics Concern  . 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      Alcohol: rare      Exercise: none    Observations/Objective:   Temperature (!) 97.3 F (36.3 C), temperature source Temporal, height 5\' 2"  (1.575 m), weight 145 lb (65.8 kg). No acute distress.  Alert and oriented.  Speech fluent and not dysarthric.  Language intact.  Eyes orthophoric on primary gaze.  Face symmetric.  Assessment and Plan:   Chronic migraine without aura, without status migrainosus, not intractable  1.  For preventative management, start zonisamide 25mg  daily for one week, then increase  to 50mg  daily.  We can increase to 100mg  daily in 5 weeks if needed. 2.  For abortive therapy, sumatriptan.  Advised to stop Excedrin and tramadol 3.  Limit use of pain relievers to no more than 2 days out of week to prevent risk of rebound or medication-overuse headache. 4.  Keep headache diary 5.  Exercise, hydration, caffeine cessation, sleep hygiene, monitor for and avoid triggers 6.  Consider:  magnesium citrate 400mg  daily, riboflavin 400mg  daily, and coenzyme Q10 100mg  three times daily 7. Always keep in mind that currently taking a hormone or birth control may be a possible trigger or aggravating factor for migraine. 8. Follow up in 4 months.   Follow Up Instructions:    -I discussed the assessment and treatment plan with the patient. The patient was provided an opportunity to ask questions and all were answered. The patient agreed with the plan and demonstrated an understanding of the instructions.   The patient was advised to call back or seek an in-person evaluation if the symptoms worsen or if the condition fails to improve as anticipated.   Cira ServantAdam Robert Abbee Cremeens, DO

## 2018-10-12 ENCOUNTER — Telehealth (INDEPENDENT_AMBULATORY_CARE_PROVIDER_SITE_OTHER): Payer: Medicare HMO | Admitting: Neurology

## 2018-10-12 ENCOUNTER — Encounter: Payer: Self-pay | Admitting: Neurology

## 2018-10-12 ENCOUNTER — Other Ambulatory Visit: Payer: Self-pay

## 2018-10-12 VITALS — Temp 97.3°F | Ht 62.0 in | Wt 145.0 lb

## 2018-10-12 DIAGNOSIS — G43709 Chronic migraine without aura, not intractable, without status migrainosus: Secondary | ICD-10-CM | POA: Diagnosis not present

## 2018-10-12 MED ORDER — ZONISAMIDE 25 MG PO CAPS: ORAL_CAPSULE | ORAL | 0 refills | Status: DC

## 2018-10-13 ENCOUNTER — Telehealth: Payer: Self-pay | Admitting: Neurology

## 2018-10-13 MED ORDER — SUMATRIPTAN SUCCINATE 100 MG PO TABS: 100.0000 mg | ORAL_TABLET | Freq: Once | ORAL | 3 refills | Status: DC | PRN

## 2018-10-13 NOTE — Telephone Encounter (Signed)
Patient has a prescription question. Thanks

## 2018-10-13 NOTE — Telephone Encounter (Signed)
Called and spoke with Pt. She wasn't sure if we were to call in refills for sumatriptan. I advised her I will send in refill.

## 2018-10-19 ENCOUNTER — Encounter: Payer: Self-pay | Admitting: Family Medicine

## 2018-10-19 ENCOUNTER — Other Ambulatory Visit: Payer: Self-pay

## 2018-10-19 ENCOUNTER — Ambulatory Visit (INDEPENDENT_AMBULATORY_CARE_PROVIDER_SITE_OTHER): Payer: Medicare HMO | Admitting: Family Medicine

## 2018-10-19 VITALS — BP 125/81 | HR 69 | Temp 98.6°F | Ht 62.0 in | Wt 149.0 lb

## 2018-10-19 DIAGNOSIS — G43709 Chronic migraine without aura, not intractable, without status migrainosus: Secondary | ICD-10-CM | POA: Diagnosis not present

## 2018-10-19 DIAGNOSIS — M8589 Other specified disorders of bone density and structure, multiple sites: Secondary | ICD-10-CM

## 2018-10-19 DIAGNOSIS — F419 Anxiety disorder, unspecified: Secondary | ICD-10-CM

## 2018-10-19 DIAGNOSIS — I10 Essential (primary) hypertension: Secondary | ICD-10-CM | POA: Diagnosis not present

## 2018-10-19 DIAGNOSIS — E7800 Pure hypercholesterolemia, unspecified: Secondary | ICD-10-CM

## 2018-10-19 DIAGNOSIS — F329 Major depressive disorder, single episode, unspecified: Secondary | ICD-10-CM

## 2018-10-19 MED ORDER — PRAVASTATIN SODIUM 40 MG PO TABS: 40.0000 mg | ORAL_TABLET | Freq: Every day | ORAL | 3 refills | Status: DC

## 2018-10-19 MED ORDER — BUPROPION HCL ER (SR) 100 MG PO TB12: 100.0000 mg | ORAL_TABLET | Freq: Every day | ORAL | 3 refills | Status: DC

## 2018-10-19 MED ORDER — METOPROLOL SUCCINATE ER 100 MG PO TB24: ORAL_TABLET | ORAL | 3 refills | Status: DC

## 2018-10-19 MED ORDER — ONDANSETRON HCL 8 MG PO TABS: 8.0000 mg | ORAL_TABLET | Freq: Three times a day (TID) | ORAL | 0 refills | Status: DC | PRN

## 2018-10-19 MED ORDER — SERTRALINE HCL 100 MG PO TABS: ORAL_TABLET | ORAL | 3 refills | Status: DC

## 2018-10-19 NOTE — Patient Instructions (Addendum)
   If you have lab work done today you will be contacted with your lab results within the next 2 weeks.  If you have not heard from us then please contact us. The fastest way to get your results is to register for My Chart.   IF you received an x-ray today, you will receive an invoice from [facility]. Please contact [facility] at [phone] with questions or concerns regarding your invoice.   IF you received labwork today, you will receive an invoice from [facility]. Please contact [facility] at [phone] with questions or concerns regarding your invoice.   Our billing staff will not be able to assist you with questions regarding bills from these companies.  You will be contacted with the lab results as soon as they are available. The fastest way to get your results is to activate your My Chart account. Instructions are located on the last page of this paperwork. If you have not heard from us regarding the results in 2 weeks, please contact this office.     Osteopenia  Osteopenia is a loss of thickness (density) inside of the bones. Another name for osteopenia is low bone mass. Mild osteopenia is a normal part of aging. It is not a disease, and it does not cause symptoms. However, if you have osteopenia and continue to lose bone mass, you could develop a condition that causes the bones to become thin and break more easily (osteoporosis). You may also lose some height, have back pain, and have a stooped posture. Although osteopenia is not a disease, making changes to your lifestyle and diet can help to prevent osteopenia from developing into osteoporosis. What are the causes? Osteopenia is caused by loss of calcium in the bones.  Bones are constantly changing. Old bone cells are continually being replaced with new bone cells. This process builds new bone. The mineral calcium is needed to build new bone and maintain bone density. Bone density is usually highest around age  35. After that, most people's bodies cannot replace all the bone they have lost with new bone. What increases the risk? You are more likely to develop this condition if:  You are older than age 50.  You are a woman who went through menopause early.  You have a long illness that keeps you in bed.  You do not get enough exercise.  You lack certain nutrients (malnutrition).  You have an overactive thyroid gland (hyperthyroidism).  You smoke.  You drink a lot of alcohol.  You are taking medicines that weaken the bones, such as steroids. What are the signs or symptoms? This condition does not cause any symptoms. You may have a slightly higher risk for bone breaks (fractures), so getting fractures more easily than normal may be an indication of osteopenia. How is this diagnosed? Your health care provider can diagnose this condition with a special type of X-ray exam that measures bone density (dual-energy X-ray absorptiometry, DEXA). This test can measure bone density in your hips, spine, and wrists. Osteopenia has no symptoms, so this condition is usually diagnosed after a routine bone density screening test is done for osteoporosis. This routine screening is usually done for:  Women who are age 65 or older.  Men who are age 70 or older. If you have risk factors for osteopenia, you may have the screening test at an earlier age. How is this treated? Making dietary and lifestyle changes can lower your risk for osteoporosis. If you have severe osteopenia   that is close to becoming osteoporosis, your health care provider may prescribe medicines and dietary supplements such as calcium and vitamin D. These supplements help to rebuild bone density. Follow these instructions at home:   Take over-the-counter and prescription medicines only as told by your health care provider. These include vitamins and supplements.  Eat a diet that is high in calcium and vitamin D. ? Calcium is found in dairy  products, beans, salmon, and leafy green vegetables like spinach and broccoli. ? Look for foods that have vitamin D and calcium added to them (fortified foods), such as orange juice, cereal, and bread.  Do 30 or more minutes of a weight-bearing exercise every day, such as walking, jogging, or playing a sport. These types of exercises strengthen the bones.  Take precautions at home to lower your risk of falling, such as: ? Keeping rooms well-lit and free of clutter, such as cords. ? Installing safety rails on stairs. ? Using rubber mats in the bathroom or other areas that are often wet or slippery.  Do not use any products that contain nicotine or tobacco, such as cigarettes and e-cigarettes. If you need help quitting, ask your health care provider.  Avoid alcohol or limit alcohol intake to no more than 1 drink a day for nonpregnant women and 2 drinks a day for men. One drink equals 12 oz of beer, 5 oz of wine, or 1 oz of hard liquor.  Keep all follow-up visits as told by your health care provider. This is important. Contact a health care provider if:  You have not had a bone density screening for osteoporosis and you are: ? A woman, age 65 or older. ? A man, age 70 or older.  You are a postmenopausal woman who has not had a bone density screening for osteoporosis.  You are older than age 50 and you want to know if you should have bone density screening for osteoporosis. Summary  Osteopenia is a loss of thickness (density) inside of the bones. Another name for osteopenia is low bone mass.  Osteopenia is not a disease, but it may increase your risk for a condition that causes the bones to become thin and break more easily (osteoporosis).  You may be at risk for osteopenia if you are older than age 50 or if you are a woman who went through early menopause.  Osteopenia does not cause any symptoms, but it can be diagnosed with a bone density screening test.  Dietary and lifestyle  changes are the first treatment for osteopenia. These may lower your risk for osteoporosis. This information is not intended to replace advice given to you by your health care provider. Make sure you discuss any questions you have with your health care provider. Document Released: 12/31/2016 Document Revised: 03/06/2017 Document Reviewed: 12/31/2016 Elsevier Patient Education  2020 Elsevier Inc.  

## 2018-10-19 NOTE — Progress Notes (Signed)
7/14/20208:19 AM  Caroline [name] 20-Dec-1950, 68 y.o., female [id number]  Chief Complaint  Patient presents with  . Anxiety  . Depression  . Hypertension    HPI:   Patient is a 68 y.o. female with past medical history significant for HTN, HLP, migraines, anxiety, depression who presents today for routine followup  Last OV Jan 2020 - no changes Since then has seen ophtho, ENT and neurology - no changes  She is overall doing well She has a co-workers that is being tested for covid but patient has no sx ostepenia Left femur and spine per dexa with low frax score, march 2020 Takes a MVI a day, does not eat diary products nor much greens Walks at work but not for exercise She is otherwise doing well and has no acute concerns today She is requesting for rx for zofran as she sometimes gets nausea with her migraines PHQ9 and GAD7 scores are zeros, doing very well on current medications Takes her prescribed meds as instructed, denies any side effects  Lab Results  Component Value Date   CREATININE 0.72 03/02/2018   BUN 16 03/02/2018   NA 142 03/02/2018   K 4.3 03/02/2018   CL 104 03/02/2018   CO2 22 03/02/2018   Lab Results  Component Value Date   CHOL 180 03/02/2018   HDL 53 03/02/2018   LDLCALC 107 (H) 03/02/2018   TRIG 100 03/02/2018   CHOLHDL 3.4 03/02/2018    Depression screen PHQ 2/9 10/19/2018 06/10/2018 04/20/2018  Decreased Interest 0 0 0  Down, Depressed, Hopeless 0 0 0  PHQ - 2 Score 0 0 0  Altered sleeping - - 0  Tired, decreased energy - - 1  Change in appetite - - 1  Feeling bad or failure about yourself  - - 0  Trouble concentrating - - 1  Moving slowly or fidgety/restless - - 0  Suicidal thoughts - - 0  PHQ-9 Score - - 3  Difficult doing work/chores - - Not difficult at all    Fall Risk  10/19/2018 10/12/2018 06/10/2018 04/20/2018 04/10/2018  Falls in the past year? 0 0 1 0 0  Number falls in past yr: 0 - 0 - -  Injury with Fall? 0 - 1 - -  Follow up -  Falls evaluation completed Falls evaluation completed - -     Allergies  Allergen Reactions  . Tetracyclines & Related Nausea And Vomiting  . Vicodin [Hydrocodone-Acetaminophen] Nausea And Vomiting    Prior to Admission medications   Medication Sig Start Date End Date Taking? Authorizing Provider  buPROPion (WELLBUTRIN SR) 100 MG 12 hr tablet Take 1 tablet (100 mg total) by mouth daily. 04/20/18  Yes Rutherford Guys, MD  cetirizine (ZYRTEC) 10 MG tablet Take 10 mg by mouth daily.   Yes [provider]  fluticasone (FLONASE) 50 MCG/ACT nasal spray Place 1 spray into both nostrils 2 (two) times daily. 03/24/18  Yes Rutherford Guys, MD  metoprolol succinate (TOPROL-XL) 100 MG 24 hr tablet TAKE 1 TABLET EVERY DAY. TAKE WITH OR IMMEDIATELY FOLLOWING A MEAL. 10/01/18  Yes Rutherford Guys, MD  pravastatin (PRAVACHOL) 40 MG tablet TAKE 1 TABLET (40 MG TOTAL) BY MOUTH DAILY. 10/01/18  Yes Rutherford Guys, MD  promethazine (PHENERGAN) 25 MG tablet Take 1 tablet (25 mg total) by mouth every 6 (six) hours as needed for nausea or vomiting. 12/28/16  Yes Drenda Freeze, MD  sertraline (ZOLOFT) 100 MG tablet TAKE 1 AND 1/2  TABLETS EVERY DAY 10/01/18  Yes Rutherford Guys, MD  SUMAtriptan (IMITREX) 100 MG tablet TAKE 1 TABLET BY MOUTH ONCE DAILY MAY  REPEAT  IN  2  HOURS  IF  HEADACHE  PERSISTS 07/20/18  Yes Rutherford Guys, MD  SUMAtriptan (IMITREX) 100 MG tablet Take 1 tablet (100 mg total) by mouth once as needed for up to 1 dose for migraine. May repeat in 2 hours if headache persists or recurs. 10/13/18  Yes Jaffe, Adam R, DO  traMADol (ULTRAM) 50 MG tablet Take 1 tablet (50 mg total) by mouth every 8 (eight) hours as needed. 06/10/18  Yes Sagardia, Ines Bloomer, MD  zonisamide (ZONEGRAN) 25 MG capsule Take 1 capsule daily for 7 days, then increase to 2 capsules daily 10/12/18  Yes Pieter Partridge, DO    Past Medical History:  Diagnosis Date  . Allergy    Zyrtec, Benadryl  . Anxiety    Zoloft  since several years  . Cataract   . Hyperlipidemia   . Hypertension   . Migraines    topmax and Imitrex    Past Surgical History:  Procedure Laterality Date  . ABDOMINAL HYSTERECTOMY     uterine prolapse; ovaries INTACT  . BLADDER REPAIR    . BREAST BIOPSY    . BREAST EXCISIONAL BIOPSY    . BREAST SURGERY     breast bx x 2 in 1980s; benign  . CATARACT EXTRACTION, BILATERAL      Social History   Tobacco Use  . Smoking status: Never Smoker  . Smokeless tobacco: Never Used  Substance Use Topics  . Alcohol use: Yes    Comment: rarely wine    Family History  Problem Relation Age of Onset  . Dementia Mother   . Cancer Father 26       oral cancer  . Heart disease Father        PAD/femoral bypass  . Hypertension Father   . Hyperlipidemia Father   . Alcohol abuse Father   . Hyperlipidemia Sister   . Hypertension Sister   . Breast cancer Maternal Aunt     Review of Systems  Constitutional: Negative for chills and fever.  Respiratory: Negative for cough and shortness of breath.   Cardiovascular: Negative for chest pain, palpitations and leg swelling.  Gastrointestinal: Negative for abdominal pain, nausea and vomiting.     OBJECTIVE:  Today's Vitals   10/19/18 0813  BP: 125/81  Pulse: 69  Temp: 98.6 F (37 C)  TempSrc: Oral  SpO2: 97%  Weight: 149 lb (67.6 kg)  Height: _0  (1.575 m)   Body mass index is 27.25 kg/m.    Physical Exam Vitals signs and nursing note reviewed.  Constitutional:      Appearance: She is well-developed.  HENT:     Head: Normocephalic and atraumatic.     Mouth/Throat:     Pharynx: No oropharyngeal exudate.  Eyes:     General: No scleral icterus.    Conjunctiva/sclera: Conjunctivae normal.     Pupils: Pupils are equal, round, and reactive to light.  Neck:     Musculoskeletal: Neck supple.  Cardiovascular:     Rate and Rhythm: Normal rate and regular rhythm.     Heart sounds: Normal heart sounds. No murmur. No friction  rub. No gallop.   Pulmonary:     Effort: Pulmonary effort is normal.     Breath sounds: Normal breath sounds. No wheezing or rales.  Musculoskeletal:  Right lower leg: No edema.     Left lower leg: No edema.  Skin:    General: Skin is warm and dry.  Neurological:     Mental Status: She is alert and oriented to person, place, and time.    ASSESSMENT and PLAN  1. Essential hypertension, benign Controlled. Continue current regime.  - Lipid panel - TSH - CMP14+EGFR  2. Pure hypercholesterolemia Checking labs today, medications will be adjusted as needed.   3. Anxiety and depression Controlled. Continue current regime.   4. Chronic migraine without aura without status migrainosus, not intractable Managed by neuro. rx for zofran prn nausea given  5. Osteopenia of multiple sites Discussed OTC calcium 1289m and vit d 2000 units a day plus weight bearing exercises. Patient educational handout given. - Vitamin D, 25-hydroxy  Other orders - sertraline (ZOLOFT) 100 MG tablet; TAKE 1 AND 1/2 TABLETS EVERY DAY - ondansetron (ZOFRAN) 8 MG tablet; Take 1 tablet (8 mg total) by mouth every 8 (eight) hours as needed for nausea or vomiting. - pravastatin (PRAVACHOL) 40 MG tablet; Take 1 tablet (40 mg total) by mouth daily. - metoprolol succinate (TOPROL-XL) 100 MG 24 hr tablet; Take with or immediately following a meal. - buPROPion (WELLBUTRIN SR) 100 MG 12 hr tablet; Take 1 tablet (100 mg total) by mouth daily.  Return in about 6 months (around 04/21/2019) for Medicare Annual Wellness.    IRutherford Guys MD [facility] [place] [id number].  [phone] [phone]

## 2018-10-20 LAB — LIPID PANEL
Chol/HDL Ratio: 3.1 ratio (ref 0.0–4.4)
Cholesterol, Total: 168 mg/dL (ref 100–199)
HDL: 54 mg/dL (ref >39)
LDL Calculated: 99 mg/dL (ref 0–99)
Triglycerides: 74 mg/dL (ref 0–149)
VLDL Cholesterol Cal: 15 mg/dL (ref 5–40)

## 2018-10-20 LAB — CMP14+EGFR
ALT: 15 IU/L (ref 0–32)
AST: 22 IU/L (ref 0–40)
Albumin/Globulin Ratio: 2.1 (ref 1.2–2.2)
Albumin: 4.5 g/dL (ref 3.8–4.8)
Alkaline Phosphatase: 83 IU/L (ref 39–117)
BUN/Creatinine Ratio: 25 (ref 12–28)
BUN: 20 mg/dL (ref 8–27)
Bilirubin Total: 0.3 mg/dL (ref 0.0–1.2)
CO2: 21 mmol/L (ref 20–29)
Calcium: 9.4 mg/dL (ref 8.7–10.3)
Chloride: 107 mmol/L — ABNORMAL HIGH (ref 96–106)
Creatinine, Ser: 0.8 mg/dL (ref 0.57–1.00)
GFR calc Af Amer: 89 mL/min/{1.73_m2} (ref >59)
GFR calc non Af Amer: 77 mL/min/{1.73_m2} (ref >59)
Globulin, Total: 2.1 g/dL (ref 1.5–4.5)
Glucose: 97 mg/dL (ref 65–99)
Potassium: 4.3 mmol/L (ref 3.5–5.2)
Sodium: 144 mmol/L (ref 134–144)
Total Protein: 6.6 g/dL (ref 6.0–8.5)

## 2018-10-20 LAB — VITAMIN D 25 HYDROXY (VIT D DEFICIENCY, FRACTURES): Vit D, 25-Hydroxy: 35.3 ng/mL (ref 30.0–100.0)

## 2018-10-20 LAB — TSH: TSH: 1.06 u[IU]/mL (ref 0.450–4.500)

## 2018-11-04 ENCOUNTER — Encounter: Payer: Self-pay | Admitting: Radiology

## 2018-11-23 ENCOUNTER — Telehealth: Payer: Self-pay | Admitting: Neurology

## 2018-11-23 NOTE — Telephone Encounter (Signed)
Zonisamide 25MG  medication is working for her and she is wanting a refill sent to the pharm- 90 day supply if possible though the [employer] order. Thanks!

## 2018-11-24 MED ORDER — ZONISAMIDE 50 MG PO CAPS: 100.0000 mg | ORAL_CAPSULE | Freq: Every day | ORAL | 3 refills | Status: DC

## 2018-11-24 NOTE — Telephone Encounter (Signed)
Called and spoke with Pt to determine what mg she was taking.  After discussing headaches with her, we determined her headaches are not at optimum control.  She will increase zonisamide to 100 mg.  I am sending in Rx for 50 mg. She will take 2 capsules.  In the event she has any side effects, she will go back to 50 mg QD.

## 2019-01-12 ENCOUNTER — Other Ambulatory Visit: Payer: Self-pay | Admitting: Family Medicine

## 2019-02-09 ENCOUNTER — Ambulatory Visit (INDEPENDENT_AMBULATORY_CARE_PROVIDER_SITE_OTHER): Payer: Medicare HMO | Admitting: Family Medicine

## 2019-02-09 ENCOUNTER — Other Ambulatory Visit: Payer: Self-pay

## 2019-02-09 DIAGNOSIS — Z23 Encounter for immunization: Secondary | ICD-10-CM | POA: Diagnosis not present

## 2019-03-28 ENCOUNTER — Other Ambulatory Visit: Payer: Self-pay | Admitting: Family Medicine

## 2019-04-05 ENCOUNTER — Ambulatory Visit (INDEPENDENT_AMBULATORY_CARE_PROVIDER_SITE_OTHER): Payer: Medicare HMO | Admitting: Family Medicine

## 2019-04-05 VITALS — BP 125/81 | Ht 62.0 in | Wt 149.0 lb

## 2019-04-05 DIAGNOSIS — Z0000 Encounter for general adult medical examination without abnormal findings: Secondary | ICD-10-CM | POA: Diagnosis not present

## 2019-04-05 NOTE — Progress Notes (Addendum)
Presents today for [employer] Visit   Date of last exam: 10/19/2018   Interpreter used for this visit? No  I connected with  Caroline [name] on 04/05/19 by a telephone  and verified that I am speaking with the correct person using two identifiers.   I discussed the limitations of evaluation and management by telemedicine. The patient expressed understanding and agreed to proceed.   Patient Care Team: Caroline Lipps, MD as PCP - General (Family Medicine) Caroline Dallas, DO as Consulting Physician (Neurology)   Other items to address today:   Discussed immunizations Discussed Eye/Dental Follow up scheduled 04/21/2019 Dr. Leretha [name]   Other Screening:  Last lipid screening: 10/19/2018  ADVANCE DIRECTIVES: Discussed: yes On File: NO Materials Provided: YES (MAILED)  Immunization status:  Immunization History  Administered Date(s) Administered  . Fluad Quad(high Dose 65+) 02/09/2019  . Influenza, High Dose Seasonal PF 03/02/2018  . Influenza,inj,Quad PF,6+ Mos 01/03/2017  . Pneumococcal Conjugate-13 10/21/2017  . Zoster 04/08/2015     Health Maintenance Due  Topic Date Due  . TETANUS/TDAP  05/14/1971  . PNA vac Low Risk Adult (2 of 2 - PPSV23) 10/22/2018     Functional Status Survey: Is the patient deaf or have difficulty hearing?: No Does the patient have difficulty seeing, even when wearing glasses/contacts?: No Does the patient have difficulty concentrating, remembering, or making decisions?: No Does the patient have difficulty walking or climbing stairs?: No Does the patient have difficulty dressing or bathing?: No Does the patient have difficulty doing errands alone such as visiting a doctor's office or shopping?: No   6CIT Screen 04/05/2019 03/02/2018  What Year? 0 points 0 points  What month? 0 points 0 points  What time? 0 points 0 points  Count back from 20 0 points 0 points  Months in reverse 0 points 0 points  Repeat phrase 0  points 0 points  Total Score 0 0        Clinical Support from 04/05/2019 in [facility]  AUDIT-C Score  0       Home Environment:   Lives in a one story home No difficulty climbing stairs Yes scattered rugs (grabbers) Yes grab bars Adequate lighting/ no clutter  Timed warm up N/   Patient Active Problem List   Diagnosis Date Noted  . Osteopenia of multiple sites 10/19/2018  . Chronic sinusitis 06/10/2018  . Essential hypertension, benign 10/22/2017  . Benign hematuria 06/17/2017  . Pure hypercholesterolemia 08/11/2016  . Anxiety and depression 08/11/2016  . Chronic migraine without aura without status migrainosus, not intractable 08/11/2016     Past Medical History:  Diagnosis Date  . Allergy    Zyrtec, Benadryl  . Anxiety    Zoloft since several years  . Cataract   . Hyperlipidemia   . Hypertension   . Migraines    topmax and Imitrex     Past Surgical History:  Procedure Laterality Date  . ABDOMINAL HYSTERECTOMY     uterine prolapse; ovaries INTACT  . BLADDER REPAIR    . BREAST BIOPSY    . BREAST EXCISIONAL BIOPSY    . BREAST SURGERY     breast bx x 2 in 1980s; benign  . CATARACT EXTRACTION, BILATERAL       Family History  Problem Relation Age of Onset  . Dementia Mother   . Cancer Father 27       oral cancer  . Heart disease Father  PAD/femoral bypass  . Hypertension Father   . Hyperlipidemia Father   . Alcohol abuse Father   . Hyperlipidemia Sister   . Hypertension Sister   . Breast cancer Maternal Aunt      Social History   Socioeconomic History  . Marital status: Married    Spouse name: Caroline [name]  . Number of children: 2  . Years of education: Not on file  . Highest education level: 12th grade  Occupational History  . Occupation: employed  Tobacco Use  . Smoking status: Never Smoker  . Smokeless tobacco: Never Used  Substance and Sexual Activity  . Alcohol use: Yes    Comment: rarely wine  . Drug use: No    . Sexual activity: Yes    Birth control/protection: Post-menopausal, Surgical  Other Topics Concern  . 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      Alcohol: rare      Exercise: none      Patient is right-handed. She lives with her husband in one level home. She rarely drinks caffeine. She is active at work.   Social Determinants of Health   Financial Resource Strain:   . Difficulty of Paying Living Expenses: Not on file  Food Insecurity:   . Worried About Programme researcher, broadcasting/film/videounning Out of Food in the Last Year: Not on file  . Ran Out of Food in the Last Year: Not on file  Transportation Needs:   . Lack of Transportation (Medical): Not on file  . Lack of Transportation (Non-Medical): Not on file  Physical Activity:   . Days of Exercise per Week: Not on file  . Minutes of Exercise per Session: Not on file  Stress:   . Feeling of Stress : Not on file  Social Connections:   . Frequency of Communication with Friends and Family: Not on file  . Frequency of Social Gatherings with Friends and Family: Not on file  . Attends Religious Services: Not on file  . Active Member of Clubs or Organizations: Not on file  . Attends BankerClub or Organization Meetings: Not on file  . Marital Status: Not on file  Intimate Partner Violence:   . Fear of Current or Ex-Partner: Not on file  . Emotionally Abused: Not on file  . Physically Abused: Not on file  . Sexually Abused: Not on file     Allergies  Allergen Reactions  . Tetracyclines & Related Nausea And Vomiting  . Vicodin [Hydrocodone-Acetaminophen] Nausea And Vomiting     Prior to Admission medications   Medication Sig Start Date End Date Taking? Authorizing Provider  buPROPion (WELLBUTRIN SR) 100 MG 12 hr  tablet Take 1 tablet (100 mg total) by mouth daily. 10/19/18  Yes Caroline LippsSantiago, Irma M, MD  cetirizine (ZYRTEC) 10 MG tablet Take 10 mg by mouth daily.   Yes [provider]  fluticasone (FLONASE) 50 MCG/ACT nasal spray Place 1 spray into both nostrils 2 (two) times daily. 03/24/18  Yes Caroline LippsSantiago, Irma M, MD  metoprolol succinate (TOPROL-XL) 100 MG 24 hr tablet Take with or immediately following a meal. 10/19/18  Yes Caroline LippsSantiago, Irma M, MD  ondansetron (ZOFRAN) 8 MG tablet Take 1 tablet (8 mg total) by mouth every  8 (eight) hours as needed for nausea or vomiting. 10/19/18  Yes Rutherford Guys, MD  pravastatin (PRAVACHOL) 40 MG tablet Take 1 tablet (40 mg total) by mouth daily. 10/19/18  Yes Rutherford Guys, MD  sertraline (ZOLOFT) 100 MG tablet TAKE 1 AND 1/2 TABLETS EVERY DAY 10/19/18  Yes Rutherford Guys, MD  SUMAtriptan (IMITREX) 100 MG tablet TAKE 1 TABLET BY MOUTH ONCE DAILY MAY  REPEAT  IN  2  HOURS  IF  HEADACHE  PERSISTS 07/20/18  Yes Rutherford Guys, MD  SUMAtriptan (IMITREX) 100 MG tablet TAKE 1 TABLET AT ONSET OF MIGRAINE, MAY REPEAT IN 2 HOURS IF NEEDED, MAX OF 2 TABLETS PER 24 HOURS 03/28/19  Yes Rutherford Guys, MD  zonisamide (ZONEGRAN) 50 MG capsule Take 2 capsules (100 mg total) by mouth daily. Patient taking differently: Take 100 mg by mouth daily. One time daily 11/24/18  Yes Jaffe, Adam R, DO  SUMAtriptan (IMITREX) 100 MG tablet Take 1 tablet (100 mg total) by mouth once as needed for up to 1 dose for migraine. May repeat in 2 hours if headache persists or recurs. 10/13/18   Pieter Partridge, DO  traMADol (ULTRAM) 50 MG tablet Take 1 tablet (50 mg total) by mouth every 8 (eight) hours as needed. Patient not taking: Reported on 04/05/2019 06/10/18   Horald Pollen, MD     Depression screen [facility] 2/9 04/05/2019 10/19/2018 06/10/2018 04/20/2018 04/20/2018  Decreased Interest 0 0 0 0 0  Down, Depressed, Hopeless 0 0 0 0 0  PHQ - 2 Score 0 0 0 0 0  Altered sleeping - - - 0 -  Tired,  decreased energy - - - 1 -  Change in appetite - - - 1 -  Feeling bad or failure about yourself  - - - 0 -  Trouble concentrating - - - 1 -  Moving slowly or fidgety/restless - - - 0 -  Suicidal thoughts - - - 0 -  PHQ-9 Score - - - 3 -  Difficult doing work/chores - - - Not difficult at all -     Fall Risk  04/05/2019 10/19/2018 10/12/2018 06/10/2018 04/20/2018  Falls in the past year? 0 0 0 1 0  Number falls in past yr: 0 0 - 0 -  Injury with Fall? 0 0 - 1 -  Follow up Falls evaluation completed;Education provided - Falls evaluation completed Falls evaluation completed -      PHYSICAL EXAM: BP 125/81 Comment: previous from visit  Ht 5\' 2"  (1.575 m)   Wt 149 lb (67.6 kg)   BMI 27.25 kg/m    Wt Readings from Last 3 Encounters:  04/05/19 149 lb (67.6 kg)  10/19/18 149 lb (67.6 kg)  10/12/18 145 lb (65.8 kg)    Medicare annual wellness visit, subsequent     Education/Counseling provided regarding diet and exercise, prevention of chronic diseases, smoking/tobacco cessation, if applicable, and reviewed "Covered Medicare Preventive Services."      I have reviewed and agree with the above AWV documentation. Irma. Reubin Milan, MD

## 2019-04-05 NOTE — Patient Instructions (Addendum)
Thank you for taking time to come for your Medicare Wellness Visit. I appreciate your ongoing commitment to your health goals. Please review the following plan we discussed and let me know if I can assist you in the future.  Caroline Mcraney LPN  Preventive Care 65 Years and Older, Female Preventive care refers to lifestyle choices and visits with your health care provider that can promote health and wellness. This includes:  A yearly physical exam. This is also called an annual well check.  Regular dental and eye exams.  Immunizations.  Screening for certain conditions.  Healthy lifestyle choices, such as diet and exercise. What can I expect for my preventive care visit? Physical exam Your health care provider will check:  Height and weight. These may be used to calculate body mass index (BMI), which is a measurement that tells if you are at a healthy weight.  Heart rate and blood pressure.  Your skin for abnormal spots. Counseling Your health care provider may ask you questions about:  Alcohol, tobacco, and drug use.  Emotional well-being.  Home and relationship well-being.  Sexual activity.  Eating habits.  History of falls.  Memory and ability to understand (cognition).  Work and work environment.  Pregnancy and menstrual history. What immunizations do I need?  Influenza (flu) vaccine  This is recommended every year. Tetanus, diphtheria, and pertussis (Tdap) vaccine  You may need a Td booster every 10 years. Varicella (chickenpox) vaccine  You may need this vaccine if you have not already been vaccinated. Zoster (shingles) vaccine  You may need this after age 60. Pneumococcal conjugate (PCV13) vaccine  One dose is recommended after age 65. Pneumococcal polysaccharide (PPSV23) vaccine  One dose is recommended after age 65. Measles, mumps, and rubella (MMR) vaccine  You may need at least one dose of MMR if you were born in 1957 or later. You may also  need a second dose. Meningococcal conjugate (MenACWY) vaccine  You may need this if you have certain conditions. Hepatitis A vaccine  You may need this if you have certain conditions or if you travel or work in places where you may be exposed to hepatitis A. Hepatitis B vaccine  You may need this if you have certain conditions or if you travel or work in places where you may be exposed to hepatitis B. Haemophilus influenzae type b (Hib) vaccine  You may need this if you have certain conditions. You may receive vaccines as individual doses or as more than one vaccine together in one shot (combination vaccines). Talk with your health care provider about the risks and benefits of combination vaccines. What tests do I need? Blood tests  Lipid and cholesterol levels. These may be checked every 5 years, or more frequently depending on your overall health.  Hepatitis C test.  Hepatitis B test. Screening  Lung cancer screening. You may have this screening every year starting at age 55 if you have a 30-pack-year history of smoking and currently smoke or have quit within the past 15 years.  Colorectal cancer screening. All adults should have this screening starting at age 50 and continuing until age 75. Your health care provider may recommend screening at age 45 if you are at increased risk. You will have tests every 1-10 years, depending on your results and the type of screening test.  Diabetes screening. This is done by checking your blood sugar (glucose) after you have not eaten for a while (fasting). You may have this done every 1-3   years.  Mammogram. This may be done every 1-2 years. Talk with your health care provider about how often you should have regular mammograms.  BRCA-related cancer screening. This may be done if you have a family history of breast, ovarian, tubal, or peritoneal cancers. Other tests  Sexually transmitted disease (STD) testing.  Bone density scan. This is done  to screen for osteoporosis. You may have this done starting at age 6. Follow these instructions at home: Eating and drinking  Eat a diet that includes fresh fruits and vegetables, whole grains, lean protein, and low-fat dairy products. Limit your intake of foods with high amounts of sugar, saturated fats, and salt.  Take vitamin and mineral supplements as recommended by your health care provider.  Do not drink alcohol if your health care provider tells you not to drink.  If you drink alcohol: ? Limit how much you have to 0-1 drink a day. ? Be aware of how much alcohol is in your drink. In the [place] one drink equals one 12 oz bottle of beer (355 mL), one 5 oz glass of wine (148 mL), or one 1 oz glass of hard liquor (44 mL). Lifestyle  Take daily care of your teeth and gums.  Stay active. Exercise for at least 30 minutes on 5 or more days each week.  Do not use any products that contain nicotine or tobacco, such as cigarettes, e-cigarettes, and chewing tobacco. If you need help quitting, ask your health care provider.  If you are sexually active, practice safe sex. Use a condom or other form of protection in order to prevent STIs (sexually transmitted infections).  Talk with your health care provider about taking a low-dose aspirin or statin. What's next?  Go to your health care provider once a year for a well check visit.  Ask your health care provider how often you should have your eyes and teeth checked.  Stay up to date on all vaccines. This information is not intended to replace advice given to you by your health care provider. Make sure you discuss any questions you have with your health care provider. Document Released: 04/20/2015 Document Revised: 03/18/2018 Document Reviewed: 03/18/2018 Elsevier Patient Education  2020 [employer].

## 2019-04-21 ENCOUNTER — Encounter: Payer: Medicare HMO | Admitting: Family Medicine

## 2019-04-28 ENCOUNTER — Encounter: Payer: Medicare HMO | Admitting: Family Medicine

## 2019-04-28 ENCOUNTER — Telehealth: Payer: Self-pay | Admitting: Family Medicine

## 2019-04-28 NOTE — Telephone Encounter (Signed)
Pt is confused over wether she needs to come in for labs . Does she need a Medicare Physical . Please reach  out to patient   FR

## 2019-05-16 ENCOUNTER — Ambulatory Visit: Payer: Medicare HMO | Attending: Internal Medicine

## 2019-05-16 DIAGNOSIS — Z23 Encounter for immunization: Secondary | ICD-10-CM | POA: Insufficient documentation

## 2019-05-16 NOTE — Progress Notes (Signed)
   Covid-19 Vaccination Clinic  Name:  Kaileah Shevchenko    MRN: [id number] DOB: 1950-10-01  05/16/2019  Ms. Tash was observed post Covid-19 immunization for 15 minutes without incidence. She was provided with Vaccine Information Sheet and instruction to access the V-Safe system.   Ms. Blas was instructed to call 911 with any severe reactions post vaccine: Marland Kitchen Difficulty breathing  . Swelling of your face and throat  . A fast heartbeat  . A bad rash all over your body  . Dizziness and weakness    Immunizations Administered    Name Date Dose VIS Date Route   Pfizer COVID-19 Vaccine 05/16/2019  6:05 PM 0.3 mL 03/18/2019 Intramuscular   Manufacturer: [employer], [employer]   Lot: [id number]   NDC: [address]-2

## 2019-05-24 ENCOUNTER — Other Ambulatory Visit: Payer: Self-pay | Admitting: Family Medicine

## 2019-05-24 DIAGNOSIS — Z1231 Encounter for screening mammogram for malignant neoplasm of breast: Secondary | ICD-10-CM

## 2019-06-10 ENCOUNTER — Ambulatory Visit: Payer: Medicare HMO | Attending: Internal Medicine

## 2019-06-10 ENCOUNTER — Other Ambulatory Visit: Payer: Self-pay | Admitting: Family Medicine

## 2019-06-10 DIAGNOSIS — Z23 Encounter for immunization: Secondary | ICD-10-CM

## 2019-06-10 NOTE — Telephone Encounter (Signed)
Requested Prescriptions  Pending Prescriptions Disp Refills  . SUMAtriptan (IMITREX) 100 MG tablet [Pharmacy Med Name: SUMATRIPTAN SUCCINATE 100 MG Tablet] 27 tablet 1    Sig: TAKE 1 TABLET AT ONSET OF MIGRAINE, MAY REPEAT IN 2 HOURS IF NEEDED, MAX OF 2 TABLETS PER 24 HOURS     Neurology:  Migraine Therapy - Triptan Passed - 06/10/2019  3:54 AM      Passed - Last BP in normal range    BP Readings from Last 1 Encounters:  04/05/19 125/81         Passed - Valid encounter within last 12 months    Recent Outpatient Visits          2 months ago Medicare annual wellness visit, subsequent   [facility], Meda Coffee, MD   4 months ago Need for prophylactic vaccination and inoculation against influenza   [facility], Asencion Partridge, MD   7 months ago Essential hypertension, benign   [facility], Meda Coffee, MD   1 year ago Chronic migraine without aura without status migrainosus, not intractable   Primary Care at [facility], Eilleen Kempf, MD   1 year ago Anxiety and depression   [facility], Meda Coffee, MD

## 2019-06-10 NOTE — Progress Notes (Signed)
   Covid-19 Vaccination Clinic  Name:  Caroline [name]    MRN: [id number] DOB: 02-25-1951  06/10/2019  Caroline [name] was observed post Covid-19 immunization for 15 minutes without incident. She was provided with Vaccine Information Sheet and instruction to access the V-Safe system.   Caroline [name] was instructed to call 911 with any severe reactions post vaccine: Marland Kitchen Difficulty breathing  . Swelling of face and throat  . A fast heartbeat  . A bad rash all over body  . Dizziness and weakness   Immunizations Administered    Name Date Dose VIS Date Route   Pfizer COVID-19 Vaccine 06/10/2019  5:23 PM 0.3 mL 03/18/2019 Intramuscular   Manufacturer: [employer], [employer]   Lot: [id number]   NDC: [address]-5

## 2019-06-21 ENCOUNTER — Ambulatory Visit
Admission: RE | Admit: 2019-06-21 | Discharge: 2019-06-21 | Disposition: A | Discharge status: Home / Self Care | Payer: Medicare HMO | Source: Ambulatory Visit | Attending: Family Medicine | Admitting: Family Medicine

## 2019-06-21 ENCOUNTER — Other Ambulatory Visit: Payer: Self-pay

## 2019-06-21 DIAGNOSIS — Z1231 Encounter for screening mammogram for malignant neoplasm of breast: Secondary | ICD-10-CM

## 2019-06-21 IMAGING — MG DIGITAL SCREENING BILAT W/ TOMO W/ CAD
8 series · 8 of 24 positions shown · non-contrast
Comparison: Previous exam(s).

CLINICAL DATA: Screening.

EXAM:
DIGITAL SCREENING BILATERAL MAMMOGRAM WITH TOMO AND CAD

[R CC synth-2D]
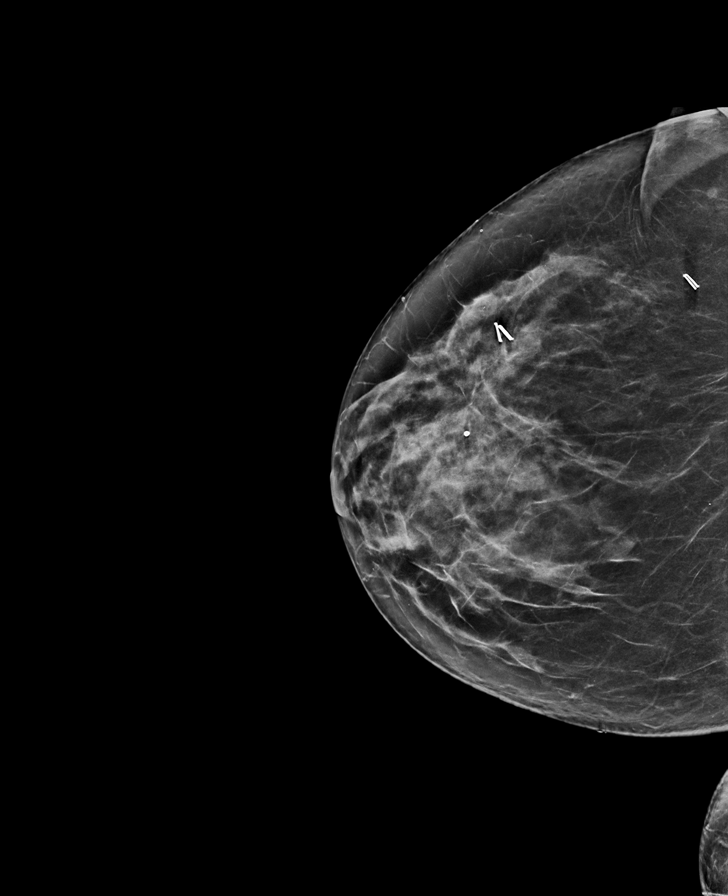

[R MLO synth-2D]
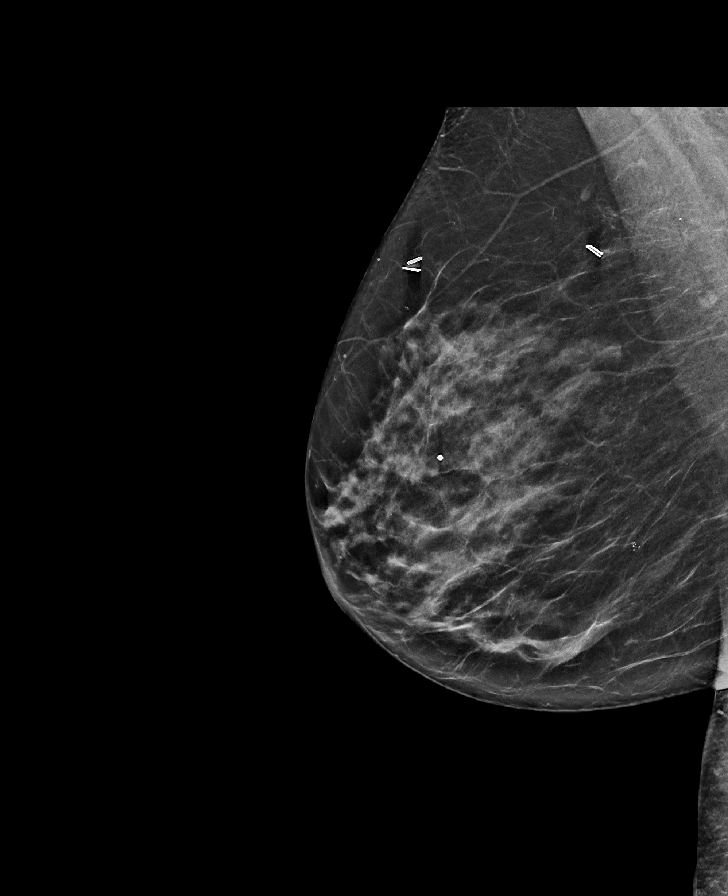

[L CC synth-2D]
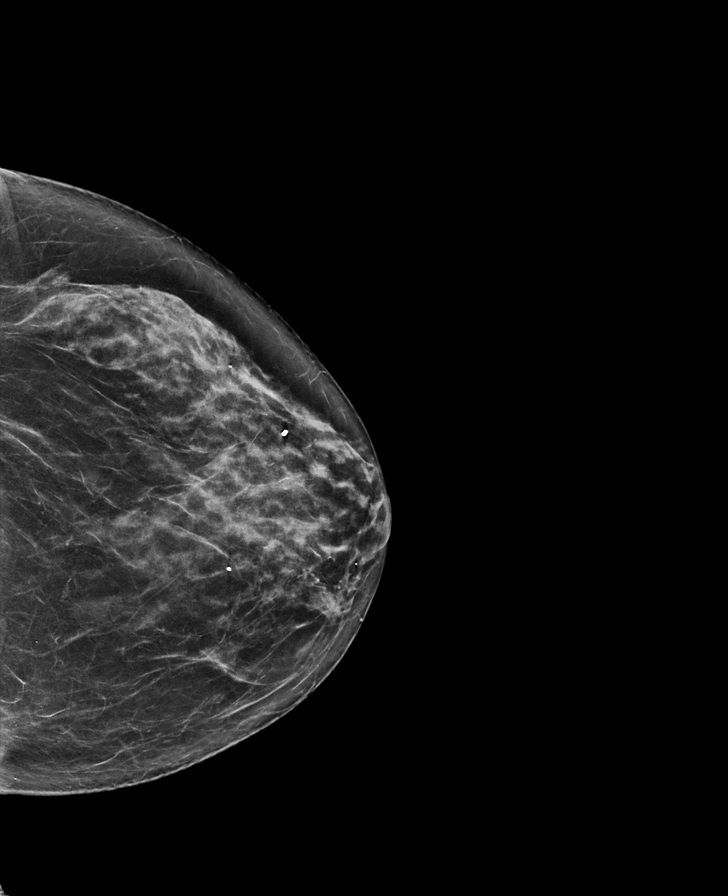

[L MLO synth-2D]
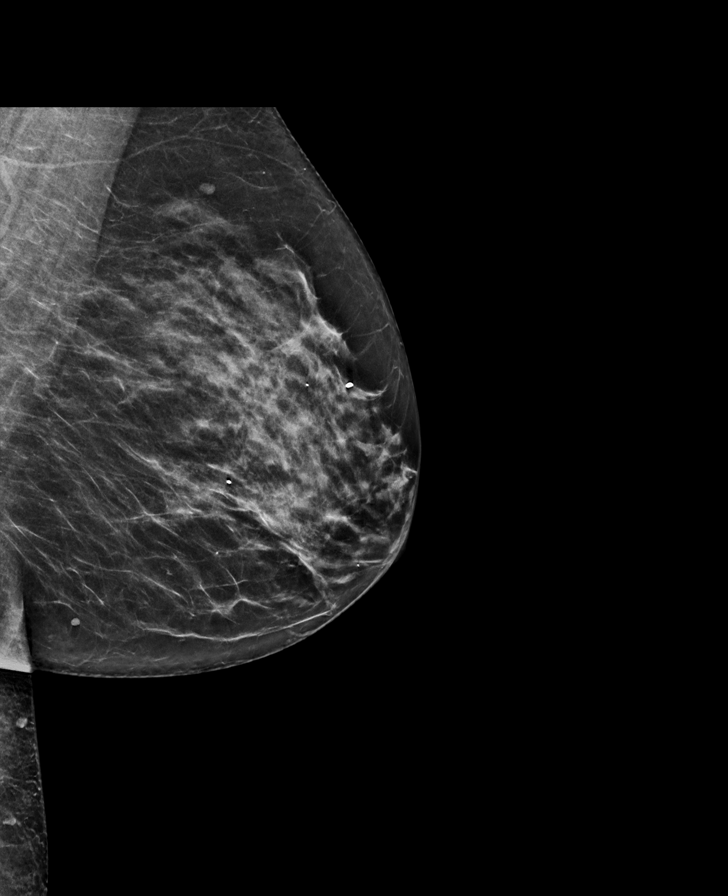

[R MLO tomo · tomo slice 33/66.0]
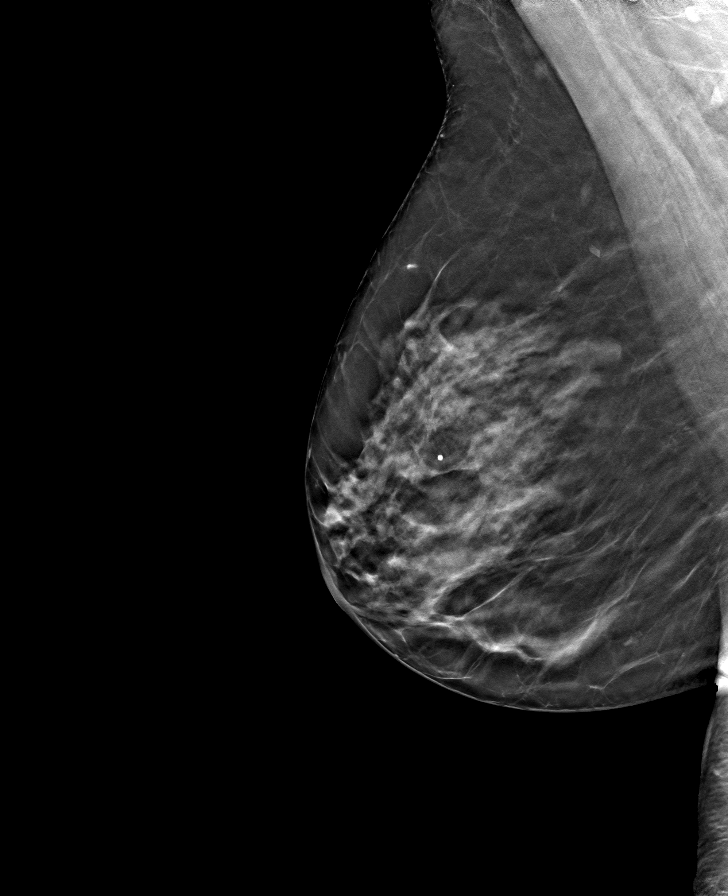

[L MLO tomo · tomo slice 33/65.0]
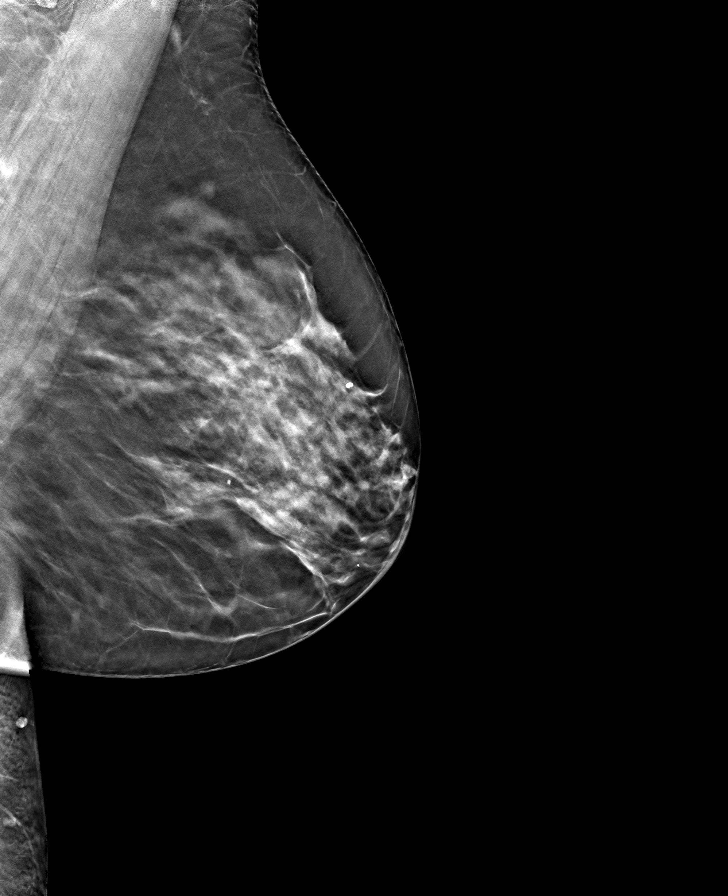

[L CC tomo · tomo slice 33/66.0]
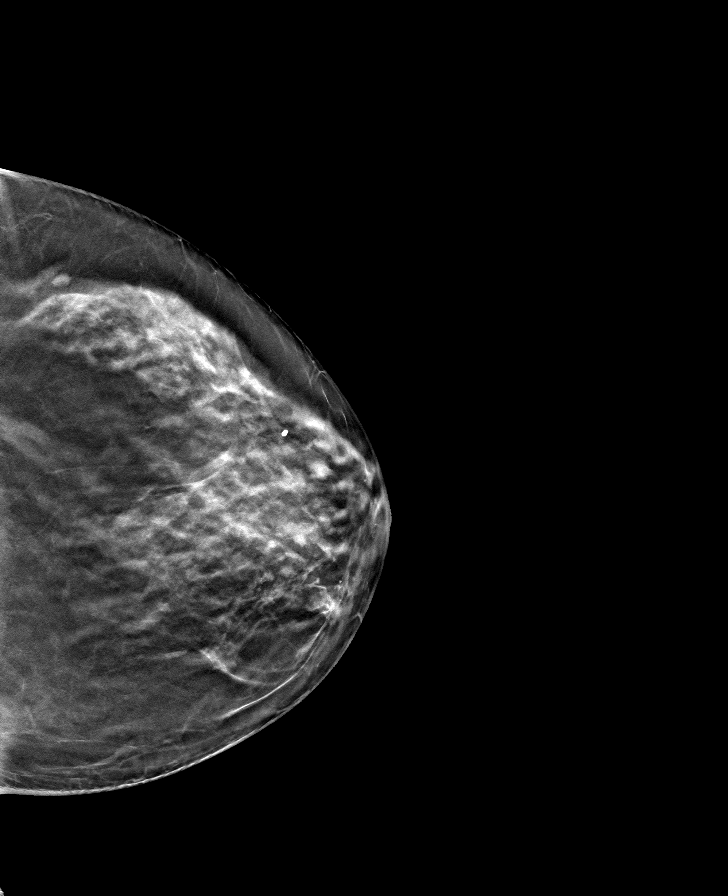

[R CC tomo · tomo slice 36/71.0]
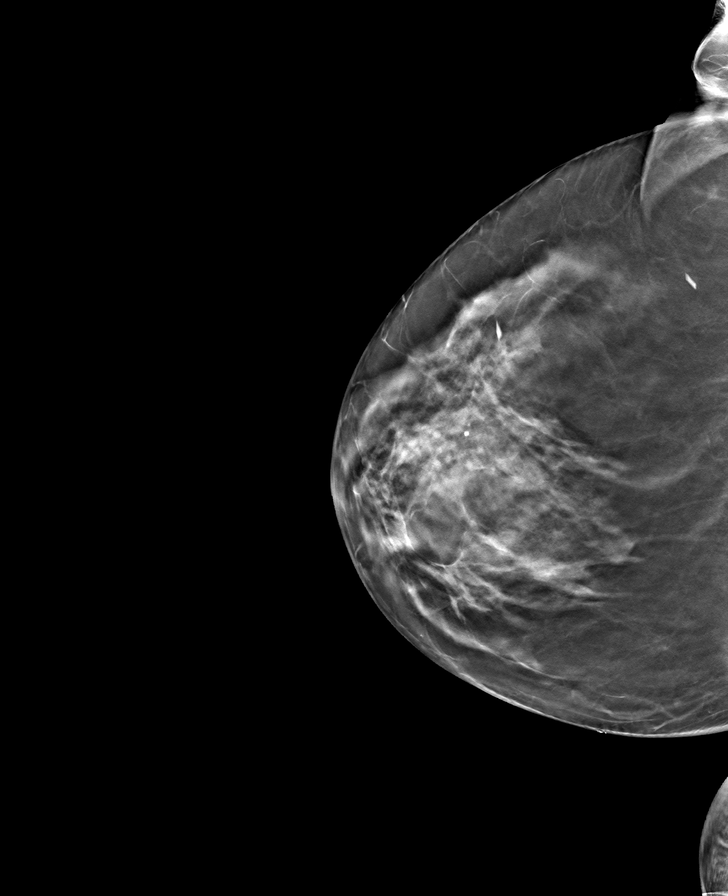

[8 of 24 positions shown; findings below may reference images not displayed]

ACR Breast Density Category c: The breast tissue is heterogeneously
dense, which may obscure small masses.
FINDINGS: There are no findings suspicious for malignancy. Images were
processed with CAD.
IMPRESSION: No mammographic evidence of malignancy. A result letter of this
screening mammogram will be mailed directly to the patient.

RECOMMENDATION:
Screening mammogram in one year. (Code:[5V])

BI-RADS CATEGORY  1: Negative.

## 2019-10-27 ENCOUNTER — Other Ambulatory Visit: Payer: Self-pay | Admitting: Family Medicine

## 2019-10-27 NOTE — Telephone Encounter (Signed)
Requested medication (s) are due for refill today: yes  Requested medication (s) are on the active medication list: yes  Last refill:  10/19/18 for all medications  Future visit scheduled: No  Notes to clinic:  attempted to contact patient. Left VM to return call to office. Needs appointment for med refill. Last OV 10/19/18, Last labs 10/19/18. Patient had medicare wellness visit 04/05/19 virtual.    Requested Prescriptions  Pending Prescriptions Disp Refills   metoprolol succinate (TOPROL-XL) 100 MG 24 hr tablet [Pharmacy Med Name: METOPROLOL SUCCINATE ER 100 MG Tablet Extended Release 24 Hour] 90 tablet 3    Sig: TAKE 1 TABLET EVERY DAY. TAKE WITH OR IMMEDIATELY FOLLOWING A MEAL.      Cardiovascular:  Beta Blockers Failed - 10/27/2019  2:51 PM      Failed - Valid encounter within last 6 months    Recent Outpatient Visits           6 months ago Medicare annual wellness visit, subsequent   [facility], Meda Coffee, MD   8 months ago Need for prophylactic vaccination and inoculation against influenza   [facility], Asencion Partridge, MD   1 year ago Essential hypertension, benign   [facility], Meda Coffee, MD   1 year ago Chronic migraine without aura without status migrainosus, not intractable   [facility] at [facility], Eilleen Kempf, MD   1 year ago Anxiety and depression   [facility], Meda Coffee, MD              Passed - Last BP in normal range    BP Readings from Last 1 Encounters:  04/05/19 125/81          Passed - Last Heart Rate in normal range    Pulse Readings from Last 1 Encounters:  10/19/18 69            sertraline (ZOLOFT) 100 MG tablet [Pharmacy Med Name: SERTRALINE HYDROCHLORIDE 100 MG Tablet] 135 tablet 3    Sig: TAKE 1 AND 1/2 TABLETS EVERY DAY      Psychiatry:  Antidepressants - SSRI Failed - 10/27/2019  2:51 PM      Failed - Valid encounter within last 6 months    Recent Outpatient  Visits           6 months ago Medicare annual wellness visit, subsequent   [facility], Meda Coffee, MD   8 months ago Need for prophylactic vaccination and inoculation against influenza   [facility], Asencion Partridge, MD   1 year ago Essential hypertension, benign   [facility], Meda Coffee, MD   1 year ago Chronic migraine without aura without status migrainosus, not intractable   [facility] at [facility], Eilleen Kempf, MD   1 year ago Anxiety and depression   [facility], Meda Coffee, MD              Passed - Completed PHQ-2 or PHQ-9 in the last 360 days.        pravastatin (PRAVACHOL) 40 MG tablet [Pharmacy Med Name: PRAVASTATIN SODIUM 40 MG Tablet] 90 tablet 3    Sig: Take 1 tablet (40 mg total) by mouth daily.      Cardiovascular:  Antilipid - Statins Failed - 10/27/2019  2:51 PM      Failed - Total Cholesterol in normal range and within  360 days    Cholesterol, Total  Date Value Ref Range Status  10/19/2018 168 100 - 199 mg/dL Final          Failed - LDL in normal range and within 360 days    LDL Calculated  Date Value Ref Range Status  10/19/2018 99 0 - 99 mg/dL Final          Failed - HDL in normal range and within 360 days    HDL  Date Value Ref Range Status  10/19/2018 54 >39 mg/dL Final          Failed - Triglycerides in normal range and within 360 days    Triglycerides  Date Value Ref Range Status  10/19/2018 74 0 - 149 mg/dL Final          Passed - Patient is not pregnant      Passed - Valid encounter within last 12 months    Recent Outpatient Visits           6 months ago Medicare annual wellness visit, subsequent   [facility], Meda Coffee, MD   8 months ago Need for prophylactic vaccination and inoculation against influenza   [facility], Asencion Partridge, MD   1 year ago Essential hypertension, benign   [facility], Meda Coffee, MD    1 year ago Chronic migraine without aura without status migrainosus, not intractable   Primary Care at [facility], Eilleen Kempf, MD   1 year ago Anxiety and depression   [facility], Meda Coffee, MD

## 2019-11-09 ENCOUNTER — Other Ambulatory Visit: Payer: Self-pay | Admitting: Family Medicine

## 2019-11-09 NOTE — Telephone Encounter (Signed)
Requested medication (s) are due for refill today - if 27 is 3 month supply- may be too early  Requested medication (s) are on the active medication list -yes  Future visit scheduled -yes  Last refill: 08/29/19  Notes to clinic: Patient may be requesting migriane medication too soon- sent for review   Requested Prescriptions  Pending Prescriptions Disp Refills   SUMAtriptan (IMITREX) 100 MG tablet [Pharmacy Med Name: SUMATRIPTAN SUCCINATE 100 MG Tablet] 27 tablet 1    Sig: TAKE 1 TABLET AT ONSET OF MIGRAINE, MAY REPEAT IN 2 HOURS IF NEEDED, MAX OF 2 TABLETS PER 24 HOURS      Neurology:  Migraine Therapy - Triptan Passed - 11/09/2019  9:33 AM      Passed - Last BP in normal range    BP Readings from Last 1 Encounters:  04/05/19 125/81          Passed - Valid encounter within last 12 months    Recent Outpatient Visits           7 months ago Medicare annual wellness visit, subsequent   [facility], Meda Coffee, MD   9 months ago Need for prophylactic vaccination and inoculation against influenza   [facility], Asencion Partridge, MD   1 year ago Essential hypertension, benign   [facility], Meda Coffee, MD   1 year ago Chronic migraine without aura without status migrainosus, not intractable   Primary Care at [facility], Eilleen Kempf, MD   1 year ago Anxiety and depression   [facility], Meda Coffee, MD       Future Appointments             In 1 week Myles Lipps, MD [facility] at [place], [facility]                Requested Prescriptions  Pending Prescriptions Disp Refills   SUMAtriptan (IMITREX) 100 MG tablet [Pharmacy Med Name: SUMATRIPTAN SUCCINATE 100 MG Tablet] 27 tablet 1    Sig: TAKE 1 TABLET AT ONSET OF MIGRAINE, MAY REPEAT IN 2 HOURS IF NEEDED, MAX OF 2 TABLETS PER 24 HOURS      Neurology:  Migraine Therapy - Triptan Passed - 11/09/2019  9:33 AM      Passed - Last BP in normal range    BP Readings  from Last 1 Encounters:  04/05/19 125/81          Passed - Valid encounter within last 12 months    Recent Outpatient Visits           7 months ago Medicare annual wellness visit, subsequent   [facility], Meda Coffee, MD   9 months ago Need for prophylactic vaccination and inoculation against influenza   [facility], Asencion Partridge, MD   1 year ago Essential hypertension, benign   [facility], Meda Coffee, MD   1 year ago Chronic migraine without aura without status migrainosus, not intractable   [facility] at [facility], Eilleen Kempf, MD   1 year ago Anxiety and depression   [facility], Meda Coffee, MD       Future Appointments             In 1 week Myles Lipps, MD Primary Care at [place], [facility]

## 2019-11-17 ENCOUNTER — Encounter: Payer: Self-pay | Admitting: Family Medicine

## 2019-11-17 ENCOUNTER — Ambulatory Visit (INDEPENDENT_AMBULATORY_CARE_PROVIDER_SITE_OTHER): Payer: Medicare HMO | Admitting: Family Medicine

## 2019-11-17 ENCOUNTER — Other Ambulatory Visit: Payer: Self-pay

## 2019-11-17 VITALS — BP 135/86 | HR 67 | Temp 97.5°F | Ht 62.0 in | Wt 149.0 lb

## 2019-11-17 DIAGNOSIS — J45909 Unspecified asthma, uncomplicated: Secondary | ICD-10-CM

## 2019-11-17 DIAGNOSIS — E7800 Pure hypercholesterolemia, unspecified: Secondary | ICD-10-CM

## 2019-11-17 DIAGNOSIS — N3941 Urge incontinence: Secondary | ICD-10-CM

## 2019-11-17 DIAGNOSIS — L821 Other seborrheic keratosis: Secondary | ICD-10-CM | POA: Diagnosis not present

## 2019-11-17 DIAGNOSIS — I10 Essential (primary) hypertension: Secondary | ICD-10-CM | POA: Diagnosis not present

## 2019-11-17 DIAGNOSIS — G8929 Other chronic pain: Secondary | ICD-10-CM

## 2019-11-17 DIAGNOSIS — R1013 Epigastric pain: Secondary | ICD-10-CM

## 2019-11-17 MED ORDER — FAMOTIDINE 20 MG PO TABS
20.0000 mg | ORAL_TABLET | Freq: Two times a day (BID) | ORAL | 3 refills | Status: AC
Start: 2019-11-17 — End: ?

## 2019-11-17 MED ORDER — ALBUTEROL SULFATE HFA 108 (90 BASE) MCG/ACT IN AERS
2.0000 | INHALATION_SPRAY | Freq: Four times a day (QID) | RESPIRATORY_TRACT | 3 refills | Status: AC | PRN
Start: 2019-11-17 — End: ?

## 2019-11-17 NOTE — Progress Notes (Signed)
8/12/202111:39 AM  Caroline [name] 1950-07-19, 69 y.o., female [id number]  Chief Complaint  Patient presents with  . mole on r shoulder    increased in size  . Medication Refill    inhaler for tightness of chest , cough, sinus drainage with seasonal allergies  . Urinary Incontinence    had a bladder sling 3 yrs ago   . upper abd pain after meals    HPI:   Patient is a 69 y.o. female with past medical history significant for HTN, HLP, migraines, anxiety, depression who presents today with several concerns  Last OV July 2020  Mole Right shoulder - present for months, raised, getting larger, does not raise, no bleeding, no h/o skin cancer  Prior to moving to [facility] - she was having issues with cough/chest tightness during hay fever season. She was prescribed albuterol to take as needed, she has no formal asthma diagnosis, former smoker - very remote, no hospitalizations, has been issues of recent during this summer, requesting refill of albuterol. Uses zyrtec, flonase and netty pot  UI - bladder sling with hyst 3 years ago in [place] for prolapse issues - she was not having urinary sx then. She reports more leakage now than prior. Feels that UOP in general less volume, denies any retention. She does reports urgency specially if combined with pressure.  She has not had evaluation since her surgery  Upper abd pain after eating - when she eats it feels that her epigastric area bloats, mildly painful. Started at least a year. No nausea, no burning pain. She does struggle with constipation - she manages with stool softener, sennakot prn. No regular reflux. No appetite changes. No melena. No red blood in stool. She has not tried anything for this. Last colonoscopy about 6 years ago - redundant colon, has never been able to have complete colonoscopy. Gets migraine, vomiting during prep.  Reports depression and anxiety well controlled just of sertraline.   BP Readings from Last 3 Encounters:    11/17/19 135/86  04/05/19 125/81  10/19/18 125/81   Wt Readings from Last 3 Encounters:  11/17/19 149 lb (67.6 kg)  04/05/19 149 lb (67.6 kg)  10/19/18 149 lb (67.6 kg)    Depression screen [facility] 2/9 11/17/2019 04/05/2019 10/19/2018  Decreased Interest 0 0 0  Down, Depressed, Hopeless 0 0 0  PHQ - 2 Score 0 0 0  Altered sleeping - - -  Tired, decreased energy - - -  Change in appetite - - -  Feeling bad or failure about yourself  - - -  Trouble concentrating - - -  Moving slowly or fidgety/restless - - -  Suicidal thoughts - - -  PHQ-9 Score - - -  Difficult doing work/chores - - -    Fall Risk  11/17/2019 04/05/2019 10/19/2018 10/12/2018 06/10/2018  Falls in the past year? 0 0 0 0 1  Number falls in past yr: 0 0 0 - 0  Injury with Fall? 0 0 0 - 1  Follow up Falls evaluation completed Falls evaluation completed;Education provided - Falls evaluation completed Falls evaluation completed     Allergies  Allergen Reactions  . Tetracyclines & Related Nausea And Vomiting  . Vicodin [Hydrocodone-Acetaminophen] Nausea And Vomiting    Prior to Admission medications   Medication Sig Start Date End Date Taking? Authorizing Provider  cetirizine (ZYRTEC) 10 MG tablet Take 10 mg by mouth daily.   Yes [provider]  fluticasone (FLONASE) 50 MCG/ACT nasal spray Place  1 spray into both nostrils 2 (two) times daily. 03/24/18  Yes Myles Lipps, MD  metoprolol succinate (TOPROL-XL) 100 MG 24 hr tablet TAKE 1 TABLET EVERY DAY. TAKE WITH OR IMMEDIATELY FOLLOWING A MEAL. 10/27/19  Yes Myles Lipps, MD  ondansetron (ZOFRAN) 8 MG tablet Take 1 tablet (8 mg total) by mouth every 8 (eight) hours as needed for nausea or vomiting. 10/19/18  Yes Myles Lipps, MD  pravastatin (PRAVACHOL) 40 MG tablet TAKE 1 TABLET (40 MG TOTAL) BY MOUTH DAILY. 10/27/19  Yes Myles Lipps, MD  sertraline (ZOLOFT) 100 MG tablet TAKE 1 AND 1/2 TABLETS EVERY DAY 10/27/19  Yes Myles Lipps, MD  SUMAtriptan  (IMITREX) 100 MG tablet TAKE 1 TABLET AT ONSET OF MIGRAINE, MAY REPEAT IN 2 HOURS IF NEEDED, MAX OF 2 TABLETS PER 24 HOURS 11/09/19  Yes Myles Lipps, MD  traMADol (ULTRAM) 50 MG tablet Take 1 tablet (50 mg total) by mouth every 8 (eight) hours as needed. 06/10/18  Yes [name], MD  buPROPion [facility] SR) 100 MG 12 hr tablet Take 1 tablet (100 mg total) by mouth daily. Patient not taking: Reported on 11/17/2019 10/19/18   Myles Lipps, MD  zonisamide (ZONEGRAN) 50 MG capsule Take 2 capsules (100 mg total) by mouth daily. Patient not taking: Reported on 11/17/2019 11/24/18   Drema Dallas, DO    Past Medical History:  Diagnosis Date  . Allergy    Zyrtec, Benadryl  . Anxiety    Zoloft since several years  . Cataract   . Hyperlipidemia   . Hypertension   . Migraines    topmax and Imitrex    Past Surgical History:  Procedure Laterality Date  . ABDOMINAL HYSTERECTOMY     uterine prolapse; ovaries INTACT  . BLADDER REPAIR    . BREAST BIOPSY    . BREAST EXCISIONAL BIOPSY    . BREAST SURGERY     breast bx x 2 in 1980s; benign  . CATARACT EXTRACTION, BILATERAL      Social History   Tobacco Use  . Smoking status: Never Smoker  . Smokeless tobacco: Never Used  Substance Use Topics  . Alcohol use: Yes    Comment: rarely wine    Family History  Problem Relation Age of Onset  . Dementia Mother   . Cancer Father 44       oral cancer  . Heart disease Father        PAD/femoral bypass  . Hypertension Father   . Hyperlipidemia Father   . Alcohol abuse Father   . Hyperlipidemia Sister   . Hypertension Sister   . Breast cancer Maternal Aunt     Review of Systems  Constitutional: Negative for chills and fever.  Cardiovascular: Negative for chest pain, palpitations and leg swelling.  per hpi   OBJECTIVE:  Today's Vitals   11/17/19 1133  BP: 135/86  Pulse: 67  Temp: (!) 97.5 F (36.4 C)  SpO2: 96%  Weight: 149 lb (67.6 kg)  Height: 5\' 2"  (1.575 m)    Body mass index is 27.25 kg/m.   Physical Exam Vitals and nursing note reviewed.  Constitutional:      Appearance: She is well-developed.  HENT:     Head: Normocephalic and atraumatic.     Mouth/Throat:     Pharynx: No oropharyngeal exudate.  Eyes:     General: No scleral icterus.    Extraocular Movements: Extraocular movements intact.     Conjunctiva/sclera:  Conjunctivae normal.     Pupils: Pupils are equal, round, and reactive to light.  Cardiovascular:     Rate and Rhythm: Normal rate and regular rhythm.     Heart sounds: Normal heart sounds. No murmur heard.  No friction rub. No gallop.   Pulmonary:     Effort: Pulmonary effort is normal.     Breath sounds: Normal breath sounds. No wheezing, rhonchi or rales.  Abdominal:     General: Bowel sounds are normal. There is no distension.     Palpations: Abdomen is soft. There is no mass.     Tenderness: There is abdominal tenderness (epigastic, RLQ - mild). There is no guarding or rebound.  Musculoskeletal:     Cervical back: Neck supple.  Skin:    General: Skin is warm and dry.     Comments: SK on right shoulder and left forearm  Neurological:     Mental Status: She is alert and oriented to person, place, and time.     No results found for this or any previous visit (from the past 24 hour(s)).  No results found.   ASSESSMENT and PLAN  1. Essential hypertension, benign Controlled. Continue current regime.   2. Pure hypercholesterolemia Checking labs today, medications will be adjusted as needed.  - Lipid panel  3. Abdominal pain, chronic, epigastric Chronic, no red flag signs, trial of H2B, improve constipation, discussed LFM. Labs pending. Consider gi referral. RTC precautions reviewed. - Comprehensive metabolic panel - Lipase  4. Asthma due to seasonal allergies Mild intermittent, stable. Refill albuterol. Cont with allergy tx.  5. Urgency incontinence - Ambulatory referral to Urology  6. Seborrheic  keratoses Benign. Reassured.  Other orders - albuterol (VENTOLIN HFA) 108 (90 Base) MCG/ACT inhaler; Inhale 2 puffs into the lungs every 6 (six) hours as needed for wheezing or shortness of breath. - famotidine (PEPCID) 20 MG tablet; Take 1 tablet (20 mg total) by mouth 2 (two) times daily.  Return in about 6 months (around 05/19/2020) for AWV.    Myles Lipps, MD Primary Care at [facility] [address] [place], [place] [address] Ph.  [phone] Fax [phone]

## 2019-11-17 NOTE — Patient Instructions (Signed)
° ° ° °  If you have lab work done today you will be contacted with your lab results within the next 2 weeks.  If you have not heard from us then please contact us. The fastest way to get your results is to register for My Chart. ° ° °IF you received an x-ray today, you will receive an invoice from [facility]. Please contact  Radiology at [phone] with questions or concerns regarding your invoice.  ° °IF you received labwork today, you will receive an invoice from [facility]. Please contact [facility] at [phone] with questions or concerns regarding your invoice.  ° °Our billing staff will not be able to assist you with questions regarding bills from these companies. ° °You will be contacted with the lab results as soon as they are available. The fastest way to get your results is to activate your My Chart account. Instructions are located on the last page of this paperwork. If you have not heard from us regarding the results in 2 weeks, please contact this office. °  ° ° ° °

## 2019-11-18 LAB — COMPREHENSIVE METABOLIC PANEL
ALT: 19 IU/L (ref 0–32)
AST: 24 IU/L (ref 0–40)
Albumin/Globulin Ratio: 2 (ref 1.2–2.2)
Albumin: 4.7 g/dL (ref 3.8–4.8)
Alkaline Phosphatase: 96 IU/L (ref 48–121)
BUN/Creatinine Ratio: 22 (ref 12–28)
BUN: 17 mg/dL (ref 8–27)
Bilirubin Total: 0.4 mg/dL (ref 0.0–1.2)
CO2: 23 mmol/L (ref 20–29)
Calcium: 9.6 mg/dL (ref 8.7–10.3)
Chloride: 104 mmol/L (ref 96–106)
Creatinine, Ser: 0.77 mg/dL (ref 0.57–1.00)
GFR calc Af Amer: 92 mL/min/{1.73_m2} (ref >59)
GFR calc non Af Amer: 80 mL/min/{1.73_m2} (ref >59)
Globulin, Total: 2.4 g/dL (ref 1.5–4.5)
Glucose: 94 mg/dL (ref 65–99)
Potassium: 4.4 mmol/L (ref 3.5–5.2)
Sodium: 141 mmol/L (ref 134–144)
Total Protein: 7.1 g/dL (ref 6.0–8.5)

## 2019-11-18 LAB — LIPASE: Lipase: 20 U/L (ref 14–72)

## 2019-11-18 LAB — LIPID PANEL
Chol/HDL Ratio: 4 ratio (ref 0.0–4.4)
Cholesterol, Total: 210 mg/dL — ABNORMAL HIGH (ref 100–199)
HDL: 53 mg/dL (ref >39)
LDL Chol Calc (NIH): 132 mg/dL — ABNORMAL HIGH (ref 0–99)
Triglycerides: 142 mg/dL (ref 0–149)
VLDL Cholesterol Cal: 25 mg/dL (ref 5–40)

## 2019-12-06 ENCOUNTER — Encounter: Payer: Self-pay | Admitting: Radiology

## 2020-03-29 ENCOUNTER — Ambulatory Visit: Payer: Medicare HMO | Admitting: Neurology

## 2020-03-29 ENCOUNTER — Other Ambulatory Visit: Payer: Self-pay

## 2020-03-29 ENCOUNTER — Encounter: Payer: Self-pay | Admitting: Neurology

## 2020-03-29 VITALS — BP 137/80 | HR 96 | Ht 62.0 in | Wt 141.5 lb

## 2020-03-29 DIAGNOSIS — G43709 Chronic migraine without aura, not intractable, without status migrainosus: Secondary | ICD-10-CM

## 2020-03-29 MED ORDER — SUMATRIPTAN SUCCINATE 100 MG PO TABS: ORAL_TABLET | ORAL | 5 refills | Status: DC

## 2020-03-29 MED ORDER — ONDANSETRON HCL 8 MG PO TABS: 8.0000 mg | ORAL_TABLET | Freq: Three times a day (TID) | ORAL | 5 refills | Status: AC | PRN

## 2020-03-29 NOTE — Patient Instructions (Signed)
°  1. Contact your insurance about cost of the following medications if approved:  Botox for chronic migraine  Aimovig  Emgality  Ajovy  Nurtec 1 tablet every other day for migraine prevention  Qulipta  Contact me with your choice. 2. Take sumatriptan 100mg  at earliest onset of headache.  May repeat dose once in 2 hours if needed.  Maximum 2 tablets in 24 hours. 3. Limit use of pain relievers to no more than 2 days out of the week.  These medications include acetaminophen, NSAIDs (ibuprofen/Advil/Motrin, naproxen/Aleve, triptans (Imitrex/sumatriptan), Excedrin, and narcotics.  This will help reduce risk of rebound headaches. 4. Be aware of common food triggers:  - Caffeine:  coffee, black tea, cola, Mt. Dew  - Chocolate  - Dairy:  aged cheeses (brie, blue, cheddar, gouda, Fidelis, provolone, [place], Swiss, etc), chocolate milk, buttermilk, sour cream, limit eggs and yogurt  - Nuts, peanut butter  - Alcohol  - Cereals/grains:  FRESH breads (fresh bagels, sourdough, doughnuts), yeast productions  - Processed/canned/aged/cured meats (pre-packaged deli meats, hotdogs)  - MSG/glutamate:  soy sauce, flavor enhancer, pickled/preserved/marinated foods  - Sweeteners:  aspartame (Equal, Nutrasweet).  Sugar and Splenda are okay  - Vegetables:  legumes (lima beans, lentils, snow peas, fava beans, pinto peans, peas, garbanzo beans), sauerkraut, onions, olives, pickles  - Fruit:  avocados, bananas, citrus fruit (orange, lemon, grapefruit), mango  - Other:  Frozen meals, macaroni and cheese 5. Routine exercise 6. Stay adequately hydrated (aim for 64 oz water daily) 7. Keep headache diary 8. Maintain proper stress management 9. Maintain proper sleep hygiene 10. Do not skip meals 11. Consider supplements:  magnesium citrate 400mg  daily, riboflavin 400mg  daily, coenzyme Q10 100mg  three times daily.

## 2020-03-29 NOTE — Progress Notes (Signed)
NEUROLOGY FOLLOW UP OFFICE NOTE  Caragh Gasper [id number]   Subjective:  Natalye Kott is a 69 year old woman who follows up for migraines.  UPDATE: Last seen in initial consultation in July 2020.  At that time, I had started her on zonisamide 50mg .  Initially she reported it was helpful but then stopped because it was ineffective and causing nausea.  She has been off of it for 7 or 8 months. Intensity:  severe Duration:  Several hours to days untreated, 60 minutes treated Frequency:  15 headache days a month Frequency of abortive medication: 15 headache days a month. Rescue protocol:  First line Excedrin Migraine, Second line Sumatriptan.  Rarely tramadol as third line. Current NSAIDS:  none Current analgesics:  Excedrin Migraine, tramadol (rarely for headache) Current triptans:  Sumatriptan 100mg  Current ergotamine:  none Current anti-emetic:  Zofran 8mg  Current muscle relaxants:  none Current anti-anxiolytic:  none Current sleep aide:  none Current Antihypertensive medications:  Toprol XL 100mg  Current Antidepressant medications:  Sertraline 150mg , Wellbutrin Current Anticonvulsant medications:  none Current anti-CGRP:  none Current Vitamins/Herbal/Supplements:  none Current Antihistamines/Decongestants:  Flonase, Zyrtec Other therapy:  none Hormone/birth control:  none  Caffeine:  No coffee.   Diet:  Tries to hydrates.  Sometimes soda Exercise:  no Depression:  controlled; Anxiety:  controlled Other pain:  Low back aches Sleep hygiene:  Overall okay.   HISTORY: Onset:  Teenager.  Over past few years, they have been more frequent. Location:  Starts above right eye and radiated to top of head on the right and across back of neck Quality:  At first an ache above the eye, then a throbbing on the head Initial Intensity:  8/10.  She denies new headache, thunderclap headache or severe headache that wakes her from sleep. Aura:  No Premonitory Phase:  none Postdrome:   Hangover effect, scalp tenderness Associated symptoms:  Scalp allodynia, sometimes nausea, sometimes vomiting, phonophobia, photophobia, osmophobia.  Once in a while, her vision seems "funny".  She denies associated autonomic symptoms or unilateral numbness or weakness. Initial Duration:  3 to 4 hours untreated, 60-90 minutes treated Initial Frequency:  3 to 4 days a week Initial Frequency of abortive medication: 3 to 4 days a week Triggers:  Processed meats, sweets, loud noise, change in barometric pressure, alcohol, anesthesia.   Aggravating factors:  Sometimes laying down Relieving factors:  Take medication and rest Activity:  Aggravates.  CT maxillofacial from 08/23/18 demonstrated acute left maxillary sinusitis CTA of head and neck from 12/28/16 were personally reviewed and were negative.   Past NSAIDS:  Ibuprofen, naproxen Past analgesics:  Fioricet Past abortive triptans:  Maxalt Past abortive ergotamine:  none Past muscle relaxants:  none Past anti-emetic:  Promethazine 25mg  Past antihypertensive medications:  Possibly Inderal Past antidepressant medications:  amitriptyline, Wellbutrin Past anticonvulsant medications:  topiramate, zonisamide Past anti-CGRP:  none Past vitamins/Herbal/Supplements:  none Past antihistamines/decongestants:  none Other past therapies:  Chiropractic medicine    Family history of headache:  Mom, sons  PAST MEDICAL HISTORY: Past Medical History:  Diagnosis Date  . Allergy    Zyrtec, Benadryl  . Anxiety    Zoloft since several years  . Cataract   . Hyperlipidemia   . Hypertension   . Migraines    topmax and Imitrex    MEDICATIONS: Current Outpatient Medications on File Prior to Visit  Medication Sig Dispense Refill  . albuterol (VENTOLIN HFA) 108 (90 Base) MCG/ACT inhaler Inhale 2 puffs into the lungs every 6 (  six) hours as needed for wheezing or shortness of breath. 8 g 3  . cetirizine (ZYRTEC) 10 MG tablet Take 10 mg by mouth  daily.    . famotidine (PEPCID) 20 MG tablet Take 1 tablet (20 mg total) by mouth 2 (two) times daily. 60 tablet 3  . fluticasone (FLONASE) 50 MCG/ACT nasal spray Place 1 spray into both nostrils 2 (two) times daily. 16 g 6  . metoprolol succinate (TOPROL-XL) 100 MG 24 hr tablet TAKE 1 TABLET EVERY DAY. TAKE WITH OR IMMEDIATELY FOLLOWING A MEAL. 90 tablet 3  . ondansetron (ZOFRAN) 8 MG tablet Take 1 tablet (8 mg total) by mouth every 8 (eight) hours as needed for nausea or vomiting. 30 tablet 0  . pravastatin (PRAVACHOL) 40 MG tablet TAKE 1 TABLET (40 MG TOTAL) BY MOUTH DAILY. 90 tablet 3  . sertraline (ZOLOFT) 100 MG tablet TAKE 1 AND 1/2 TABLETS EVERY DAY 135 tablet 3  . SUMAtriptan (IMITREX) 100 MG tablet TAKE 1 TABLET AT ONSET OF MIGRAINE, MAY REPEAT IN 2 HOURS IF NEEDED, MAX OF 2 TABLETS PER 24 HOURS 27 tablet 1  . traMADol (ULTRAM) 50 MG tablet Take 1 tablet (50 mg total) by mouth every 8 (eight) hours as needed. 20 tablet 0  . zonisamide (ZONEGRAN) 50 MG capsule Take 2 capsules (100 mg total) by mouth daily. (Patient not taking: Reported on 11/17/2019) 180 capsule 3   No current facility-administered medications on file prior to visit.    ALLERGIES: Allergies  Allergen Reactions  . Tetracyclines & Related Nausea And Vomiting  . Vicodin [Hydrocodone-Acetaminophen] Nausea And Vomiting    FAMILY HISTORY: Family History  Problem Relation Age of Onset  . Dementia Mother   . Cancer Father 56       oral cancer  . Heart disease Father        PAD/femoral bypass  . Hypertension Father   . Hyperlipidemia Father   . Alcohol abuse Father   . Hyperlipidemia Sister   . Hypertension Sister   . Breast cancer Maternal Aunt     SOCIAL HISTORY: Social History   Socioeconomic History  . Marital status: Married    Spouse name: Casimiro Needle  . Number of children: 2  . Years of education: Not on file  . Highest education level: 12th grade  Occupational History  . Occupation: employed   Tobacco Use  . Smoking status: Never Smoker  . Smokeless tobacco: Never Used  Substance and Sexual Activity  . Alcohol use: Yes    Comment: rarely wine  . Drug use: No  . Sexual activity: Yes    Birth control/protection: Post-menopausal, Surgical  Other Topics Concern  . 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      Alcohol: rare      Exercise: none      Patient is right-handed. She lives with her husband in one level home. She rarely drinks caffeine. She is active at work.   Social Determinants of Health   Financial Resource Strain: Not on file  Food Insecurity: Not on file  Transportation Needs: Not on file  Physical Activity: Not on file  Stress: Not on file  Social Connections: Not on file  Intimate Partner Violence: Not on file     Objective:  Blood pressure 137/80, pulse 96, height 5\' 2"  (1.575 m), weight 141 lb 8 oz (64.2 kg), SpO2 (!) 75 %. General: No acute distress.  Patient appears well-groomed.     Assessment/Plan:   Chronic migraine without aura, without status migrainosus, not intractable (at least 15 headache days a month).  She has failed antihypertensive (beta blockers), antidepressant (amitriptyline) and antiepileptic medications (topiramate, zonisamide).  Her best options would be CGRP inhibitor or Botox.  The problem is that with [facility], the cost for CGRP inhibitors is often high even if approved.  1.  I have provided her with the names of the various CGRP inhibitor preventatives.  I have advised her to contact her insurance to find out the potential copay if any of these medications or Botox is approved.  She will contact me with her answer. 2.  Ideally, I try to avoid triptans in patients over 50  years old due to potential of stroke and MI with advanced age.  However, she has failed OTC analgesics, CGRP inhibitors likely won't be approved/affordable and sumatriptan works.  Unless she has underlying CAD or history of stroke (which she does not), there is no absolute contraindication to continuing triptans at this time.  Sumatriptan 100mg  refilled.  Zofran refilled for nausea. 3.  Limit use of pain relievers to no more than 2 days out of week to prevent risk of rebound or medication-overuse headache. 4.  Keep headache diary 5.  Follow up in 6 months (or potentially sooner if she proceeds with Botox)  78, DO  CC: , MD

## 2020-05-03 ENCOUNTER — Ambulatory Visit: Payer: Medicare HMO | Admitting: Neurology

## 2020-07-05 ENCOUNTER — Other Ambulatory Visit: Payer: Self-pay | Admitting: Family Medicine

## 2020-07-05 DIAGNOSIS — Z1231 Encounter for screening mammogram for malignant neoplasm of breast: Secondary | ICD-10-CM

## 2020-07-14 ENCOUNTER — Other Ambulatory Visit: Payer: Self-pay

## 2020-07-14 ENCOUNTER — Ambulatory Visit
Admission: RE | Admit: 2020-07-14 | Discharge: 2020-07-14 | Disposition: A | Discharge status: Home / Self Care | Payer: Medicare HMO | Source: Ambulatory Visit | Attending: Family Medicine | Admitting: Family Medicine

## 2020-07-14 DIAGNOSIS — Z1231 Encounter for screening mammogram for malignant neoplasm of breast: Secondary | ICD-10-CM

## 2020-07-14 IMAGING — MG MM DIGITAL SCREENING BILAT W/ TOMO AND CAD
8 series · 8 of 24 positions shown · non-contrast
Comparison: Previous exam(s).

CLINICAL DATA: Screening. History of RIGHT breast surgery.

EXAM:
DIGITAL SCREENING BILATERAL MAMMOGRAM WITH TOMOSYNTHESIS AND CAD
TECHNIQUE: Bilateral screening digital craniocaudal and mediolateral oblique
mammograms were obtained. Bilateral screening digital breast
tomosynthesis was performed. The images were evaluated with
computer-aided detection.

[L CC synth-2D]
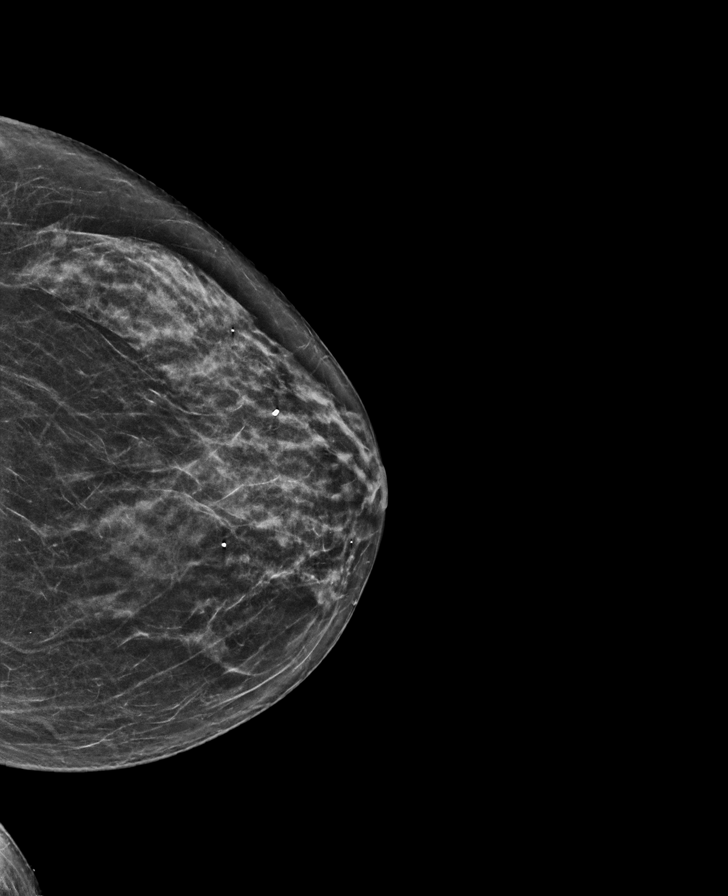

[L MLO synth-2D]
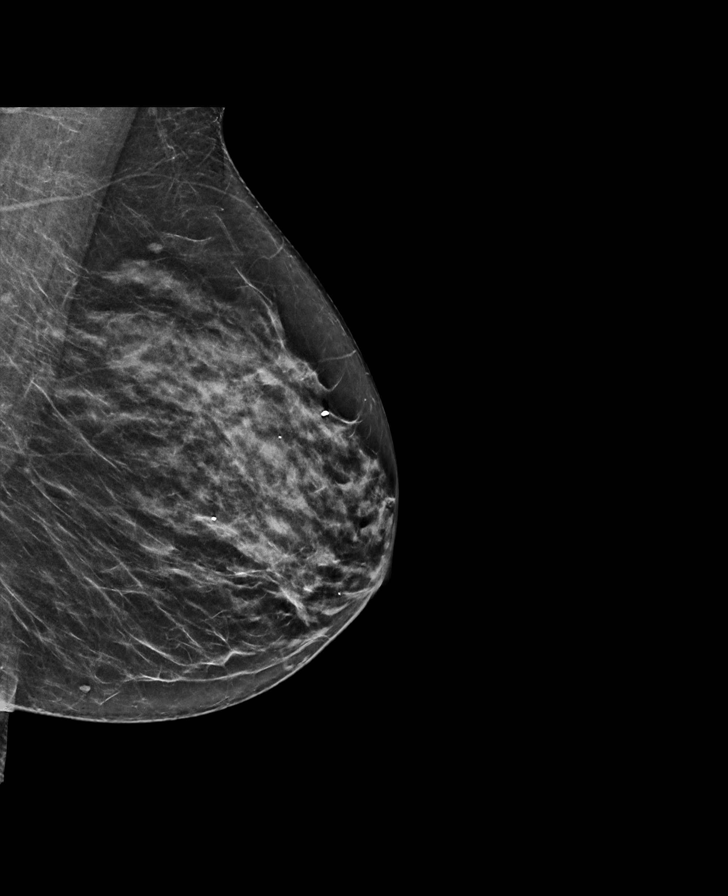

[R CC synth-2D]
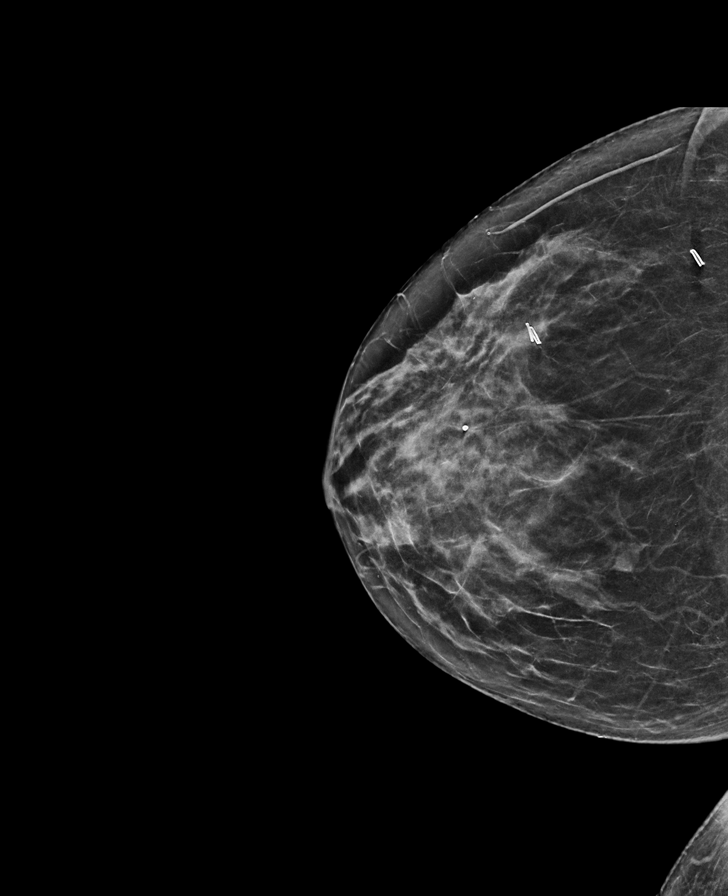

[R MLO synth-2D]
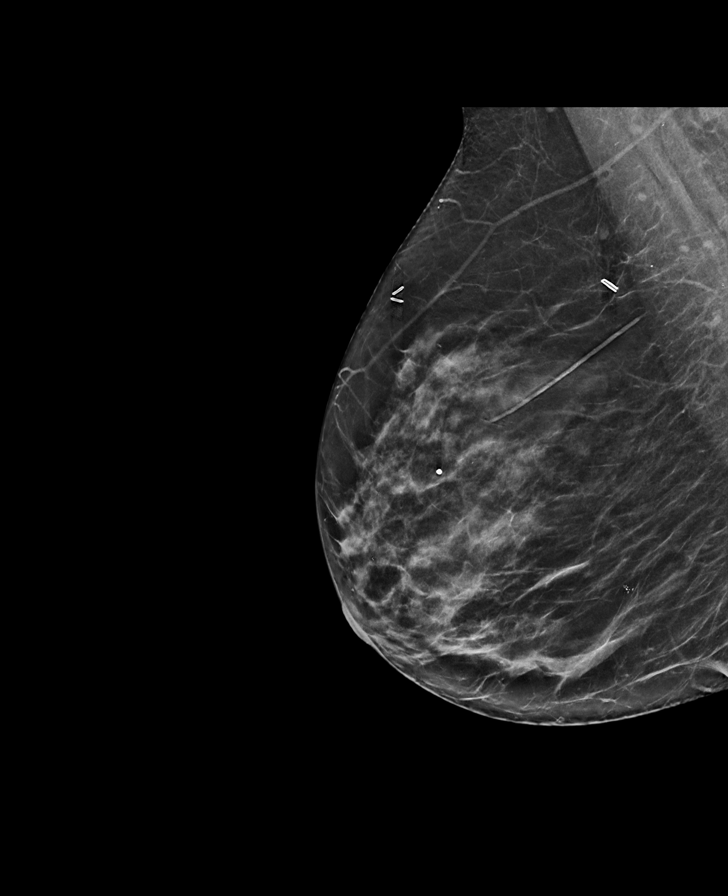

[R CC tomo · tomo slice 31/62.0]
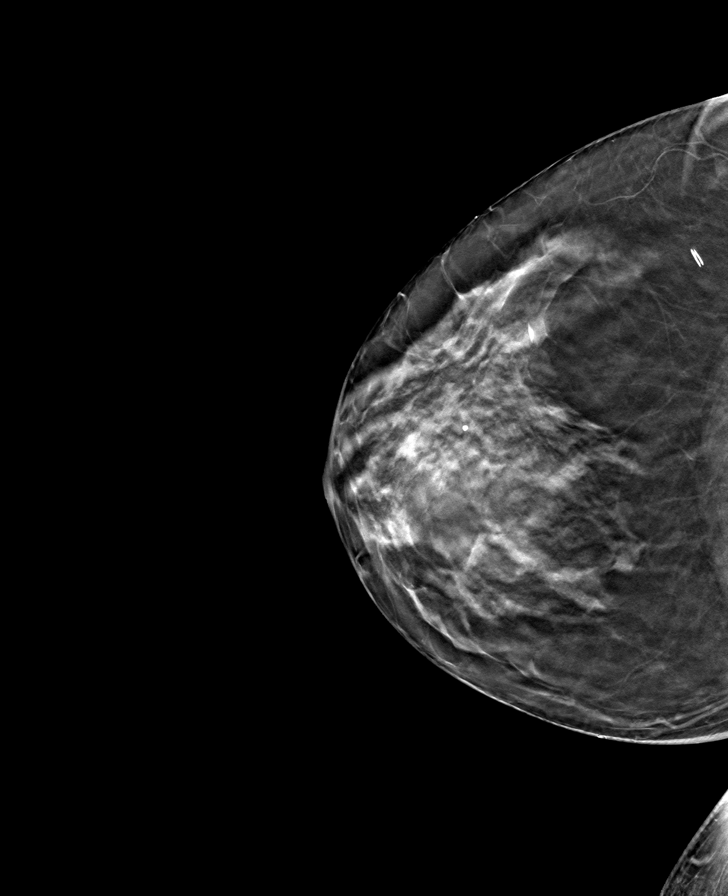

[R MLO tomo · tomo slice 33/66.0]
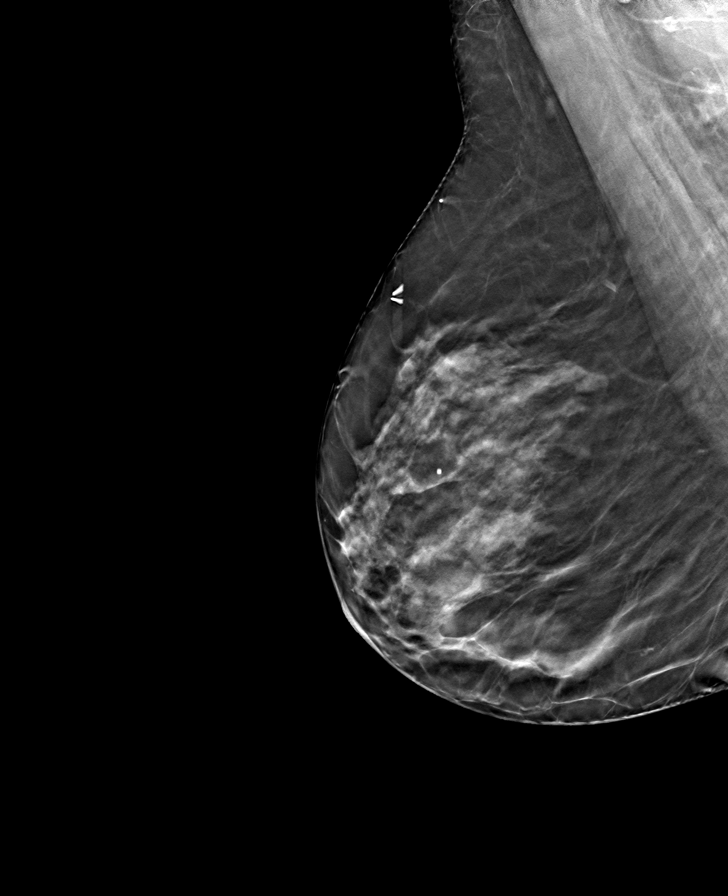

[L MLO tomo · tomo slice 31/62.0]
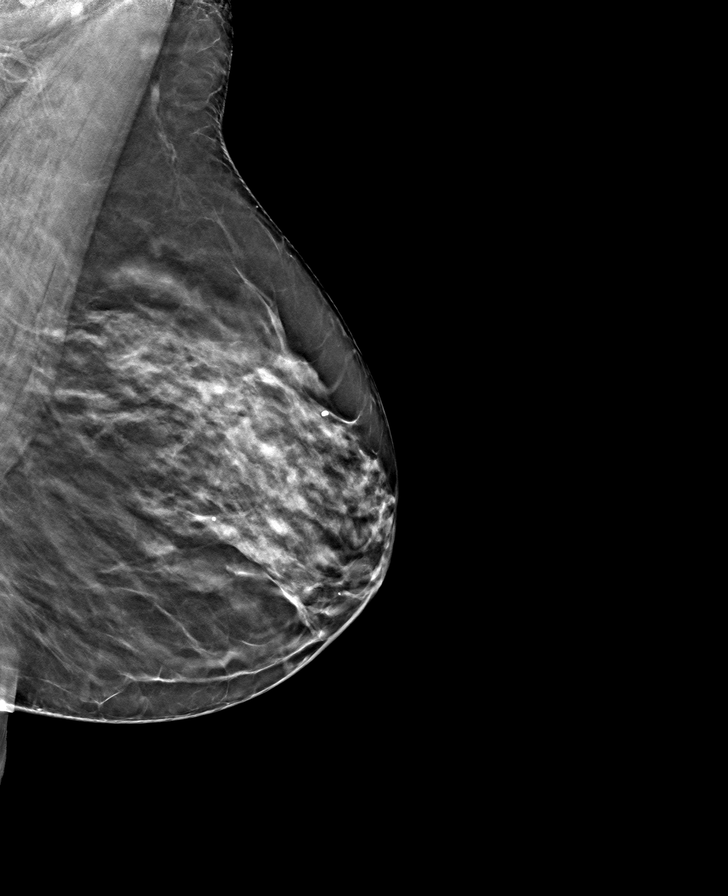

[L CC tomo · tomo slice 29/58.0]
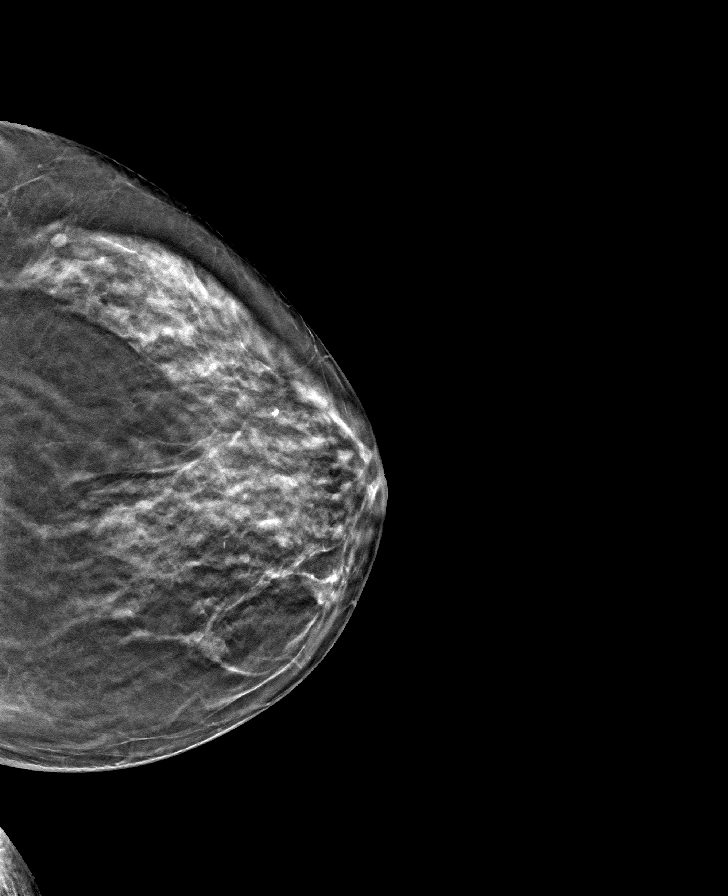

[8 of 24 positions shown; findings below may reference images not displayed]

ACR Breast Density Category c: The breast tissue is heterogeneously
dense, which may obscure small masses.
FINDINGS: There are no findings suspicious for malignancy.

RIGHT breast surgical changes again noted.

The images were evaluated with computer-aided detection.
IMPRESSION: No mammographic evidence of malignancy. A result letter of this
screening mammogram will be mailed directly to the patient.

RECOMMENDATION:
Screening mammogram in one year. (Code:[AJ])

BI-RADS CATEGORY  2: Benign.

## 2020-09-14 ENCOUNTER — Ambulatory Visit: Payer: Medicare HMO | Admitting: Neurology

## 2020-09-14 NOTE — Progress Notes (Signed)
NEUROLOGY FOLLOW UP OFFICE NOTE  Caroline [name] [id number]  Assessment/Plan:   Migraine without aura, without status migrainosus, not intractable   Migraine prevention:  Deferred.  Controlled with lifestyle modification  Migraine rescue:  Will have her try Nurtec.  If ineffective, will have her try Ubrelvy.  Although not absolute contraindication (no history of CAD or CVA), I would rather that she avoid triptans due to her age.  Promethazine for nausea. Limit use of pain relievers to no more than 2 days out of week to prevent risk of rebound or medication-overuse headache. Keep headache diary Follow up 6 months.  Subjective:  Caroline [name] is a 70 year old woman who follows up for migraines.   UPDATE: Last visit, patient was going to find out from her insurance her best financially viable treatment option - CGRP inhibitor vs Botox.  She checked with insurance and all options were too expensive.  Regardless, she headaches have diminished.  She has worked on Pharmacologist.  She is going to a cancer support group and is seeing a therapist. She has tried limiting sugar intake and remaining out of the heat. Intensity:  moderate. Duration:  Several hours to days untreated, 60 minutes treated Frequency:  2 headache days a month Frequency of abortive medication: 15 headache days a month. Rescue protocol:  First line Excedrin Migraine, Second line Sumatriptan.  Rarely tramadol as third line. Current NSAIDS:  none Current analgesics:  Excedrin Migraine, tramadol (rarely for headache) Current triptans:  Sumatriptan 100mg  Current ergotamine:  none Current anti-emetic:  Zofran 8mg  Current muscle relaxants:  none Current anti-anxiolytic:  none Current sleep aide:  none Current Antihypertensive medications:  Toprol XL 100mg  Current Antidepressant medications:  Sertraline 150mg , Wellbutrin Current Anticonvulsant medications:  none Current anti-CGRP:  none Current Vitamins/Herbal/Supplements:   none Current Antihistamines/Decongestants:  Flonase, Zyrtec Other therapy:  none Hormone/birth control:  none   Caffeine:  No coffee.   Diet:  Tries to hydrates.  Avoiding food triggers. Exercise:  no Depression:  controlled; Anxiety:  controlled.  Seeing therapist.  Has a psychiatrist.  Now on hydroxyzine.  Attending a cancer support group. Other pain:  Low back aches Sleep hygiene:  Overall okay.    HISTORY:  Onset:  Teenager.  Over past few years, they have been more frequent. Location:  Starts above right eye and radiated to top of head on the right and across back of neck Quality:  At first an ache above the eye, then a throbbing on the head Initial Intensity:  8/10.  She denies new headache, thunderclap headache or severe headache that wakes her from sleep. Aura:  No Premonitory Phase:  none Postdrome:  Hangover effect, scalp tenderness Associated symptoms:  Scalp allodynia, sometimes nausea, sometimes vomiting, phonophobia, photophobia, osmophobia.  Once in a while, her vision seems "funny".  She denies associated autonomic symptoms or unilateral numbness or weakness. Initial Duration:  3 to 4 hours untreated, 60-90 minutes treated Initial Frequency:  3 to 4 days a week Initial Frequency of abortive medication: 3 to 4 days a week Triggers:  Processed meats, sweets, loud noise, change in barometric pressure, alcohol, stress, in the heat too long, anesthesia.   Aggravating factors:  Sometimes laying down Relieving factors:  Take medication and rest Activity:  Aggravates.   CT maxillofacial from 08/23/18 demonstrated acute left maxillary sinusitis CTA of head and neck from 12/28/16 were personally reviewed and were negative.     Past NSAIDS:  Ibuprofen, naproxen Past analgesics:  Fioricet Past abortive  triptans:  Maxalt Past abortive ergotamine:  none Past muscle relaxants:  none Past anti-emetic:  Promethazine 25mg  Past antihypertensive medications:  Possibly Inderal Past  antidepressant medications:  amitriptyline, Wellbutrin Past anticonvulsant medications:  topiramate, zonisamide Past anti-CGRP:  none Past vitamins/Herbal/Supplements:  none Past antihistamines/decongestants:  none Other past therapies:  Chiropractic medicine     Family history of headache:  Mom, sons  PAST MEDICAL HISTORY: Past Medical History:  Diagnosis Date   Allergy    Zyrtec, Benadryl   Anxiety    Zoloft since several years   Cataract    Hyperlipidemia    Hypertension    Migraines    topmax and Imitrex    MEDICATIONS: Current Outpatient Medications on File Prior to Visit  Medication Sig Dispense Refill   albuterol (VENTOLIN HFA) 108 (90 Base) MCG/ACT inhaler Inhale 2 puffs into the lungs every 6 (six) hours as needed for wheezing or shortness of breath. 8 g 3   cetirizine (ZYRTEC) 10 MG tablet Take 10 mg by mouth daily. (Patient not taking: Reported on 03/29/2020)     famotidine (PEPCID) 20 MG tablet Take 1 tablet (20 mg total) by mouth 2 (two) times daily. 60 tablet 3   fluticasone (FLONASE) 50 MCG/ACT nasal spray Place 1 spray into both nostrils 2 (two) times daily. 16 g 6   metoprolol succinate (TOPROL-XL) 100 MG 24 hr tablet TAKE 1 TABLET EVERY DAY. TAKE WITH OR IMMEDIATELY FOLLOWING A MEAL. 90 tablet 3   ondansetron (ZOFRAN) 8 MG tablet Take 1 tablet (8 mg total) by mouth every 8 (eight) hours as needed for nausea or vomiting. 30 tablet 5   pravastatin (PRAVACHOL) 40 MG tablet TAKE 1 TABLET (40 MG TOTAL) BY MOUTH DAILY. 90 tablet 3   sertraline (ZOLOFT) 100 MG tablet TAKE 1 AND 1/2 TABLETS EVERY DAY 135 tablet 3   SUMAtriptan (IMITREX) 100 MG tablet Take 1 tablet as needed.  May repeat in 2 hours if headache persists or recurs.  Maximum 2 tablets in 24 hours. 10 tablet 5   traMADol (ULTRAM) 50 MG tablet Take 1 tablet (50 mg total) by mouth every 8 (eight) hours as needed. (Patient not taking: Reported on 03/29/2020) 20 tablet 0   zonisamide (ZONEGRAN) 50 MG capsule  Take 2 capsules (100 mg total) by mouth daily. 180 capsule 3   No current facility-administered medications on file prior to visit.    ALLERGIES: Allergies  Allergen Reactions   Tetracyclines & Related Nausea And Vomiting   Vicodin [Hydrocodone-Acetaminophen] Nausea And Vomiting    FAMILY HISTORY: Family History  Problem Relation Age of Onset   Dementia Mother    Cancer Father 66       oral cancer   Heart disease Father        PAD/femoral bypass   Hypertension Father    Hyperlipidemia Father    Alcohol abuse Father    Hyperlipidemia Sister    Hypertension Sister    Breast cancer Maternal Aunt       Objective:  Blood pressure 127/82, pulse 63, height 5\' 2"  (1.575 m), weight 140 lb (63.5 kg), SpO2 97 %. General: No acute distress.  Patient appears well-groomed.    72, DO  CC: , MD

## 2020-09-17 ENCOUNTER — Encounter: Payer: Self-pay | Admitting: Neurology

## 2020-09-17 ENCOUNTER — Other Ambulatory Visit: Payer: Self-pay

## 2020-09-17 ENCOUNTER — Ambulatory Visit: Payer: Medicare HMO | Admitting: Neurology

## 2020-09-17 VITALS — BP 127/82 | HR 63 | Ht 62.0 in | Wt 140.0 lb

## 2020-09-17 DIAGNOSIS — G43009 Migraine without aura, not intractable, without status migrainosus: Secondary | ICD-10-CM

## 2020-09-17 NOTE — Patient Instructions (Signed)
  Instead of sumatriptan, try Nurtec at earliest onset of headache.  Maximum 1 tablet in 24 hours.  If ineffective, we can have you try another medications. Limit use of pain relievers to no more than 2 days out of the week.  These medications include acetaminophen, NSAIDs (ibuprofen/Advil/Motrin, naproxen/Aleve, triptans (Imitrex/sumatriptan), Excedrin, and narcotics.  This will help reduce risk of rebound headaches. Be aware of common food triggers:  - Caffeine:  coffee, black tea, cola, Mt. Dew  - Chocolate  - Dairy:  aged cheeses (brie, blue, cheddar, gouda, [place], provolone, [place], Swiss, etc), chocolate milk, buttermilk, sour cream, limit eggs and yogurt  - Nuts, peanut butter  - Alcohol  - Cereals/grains:  FRESH breads (fresh bagels, sourdough, doughnuts), yeast productions  - Processed/canned/aged/cured meats (pre-packaged deli meats, hotdogs)  - MSG/glutamate:  soy sauce, flavor enhancer, pickled/preserved/marinated foods  - Sweeteners:  aspartame (Equal, Nutrasweet).  Sugar and Splenda are okay  - Vegetables:  legumes (lima beans, lentils, snow peas, fava beans, pinto peans, peas, garbanzo beans), sauerkraut, onions, olives, pickles  - Fruit:  avocados, bananas, citrus fruit (orange, lemon, grapefruit), mango  - Other:  Frozen meals, macaroni and cheese Routine exercise Stay adequately hydrated (aim for 64 oz water daily) Keep headache diary Maintain proper stress management Maintain proper sleep hygiene Do not skip meals Consider supplements:  magnesium citrate 400mg  daily, riboflavin 400mg  daily, coenzyme Q10 100mg  three times daily.

## 2020-09-27 ENCOUNTER — Ambulatory Visit: Payer: Medicare HMO | Admitting: Neurology

## 2021-01-01 ENCOUNTER — Other Ambulatory Visit: Payer: Self-pay | Admitting: Neurology

## 2021-02-04 ENCOUNTER — Telehealth: Payer: Self-pay

## 2021-02-04 NOTE — Telephone Encounter (Signed)
Caroline [name] called wanting to know if Dr Jimmey Ralph will accept her as a new pt. Her husband, Caroline [name] is a current pt of Dr Jimmey Ralph. Please Advise.

## 2021-02-07 NOTE — Telephone Encounter (Signed)
Called pt to schedule NP appt. PT stated she will call back to schedule.

## 2021-02-07 NOTE — Telephone Encounter (Signed)
Yes it is ok to schedule a new patient appointment.  Katina Degree. Jimmey Ralph, MD 02/07/2021 9:58 AM

## 2021-02-07 NOTE — Telephone Encounter (Signed)
Please schedule appt

## 2021-02-08 ENCOUNTER — Other Ambulatory Visit: Payer: Self-pay | Admitting: Internal Medicine

## 2021-03-11 ENCOUNTER — Ambulatory Visit: Payer: Self-pay

## 2021-03-11 ENCOUNTER — Ambulatory Visit: Payer: Medicare HMO | Admitting: Family Medicine

## 2021-03-11 ENCOUNTER — Other Ambulatory Visit: Payer: Self-pay

## 2021-03-11 ENCOUNTER — Ambulatory Visit (INDEPENDENT_AMBULATORY_CARE_PROVIDER_SITE_OTHER): Payer: Medicare HMO

## 2021-03-11 VITALS — BP 126/82 | HR 69 | Ht 62.0 in | Wt 143.8 lb

## 2021-03-11 DIAGNOSIS — M170 Bilateral primary osteoarthritis of knee: Secondary | ICD-10-CM

## 2021-03-11 DIAGNOSIS — M7751 Other enthesopathy of right foot: Secondary | ICD-10-CM

## 2021-03-11 DIAGNOSIS — M25561 Pain in right knee: Secondary | ICD-10-CM

## 2021-03-11 DIAGNOSIS — G8929 Other chronic pain: Secondary | ICD-10-CM | POA: Diagnosis not present

## 2021-03-11 DIAGNOSIS — M25562 Pain in left knee: Secondary | ICD-10-CM

## 2021-03-11 DIAGNOSIS — M1711 Unilateral primary osteoarthritis, right knee: Secondary | ICD-10-CM | POA: Diagnosis not present

## 2021-03-11 DIAGNOSIS — M1712 Unilateral primary osteoarthritis, left knee: Secondary | ICD-10-CM

## 2021-03-11 IMAGING — DX DG KNEE AP/LAT W/ SUNRISE*L*
3 series · 3 of 3 positions shown · non-contrast
Comparison: None.

CLINICAL DATA: Bilateral knee pain, left greater than right. No
known injury.

EXAM:
LEFT KNEE 3 VIEWS

[knee ap]
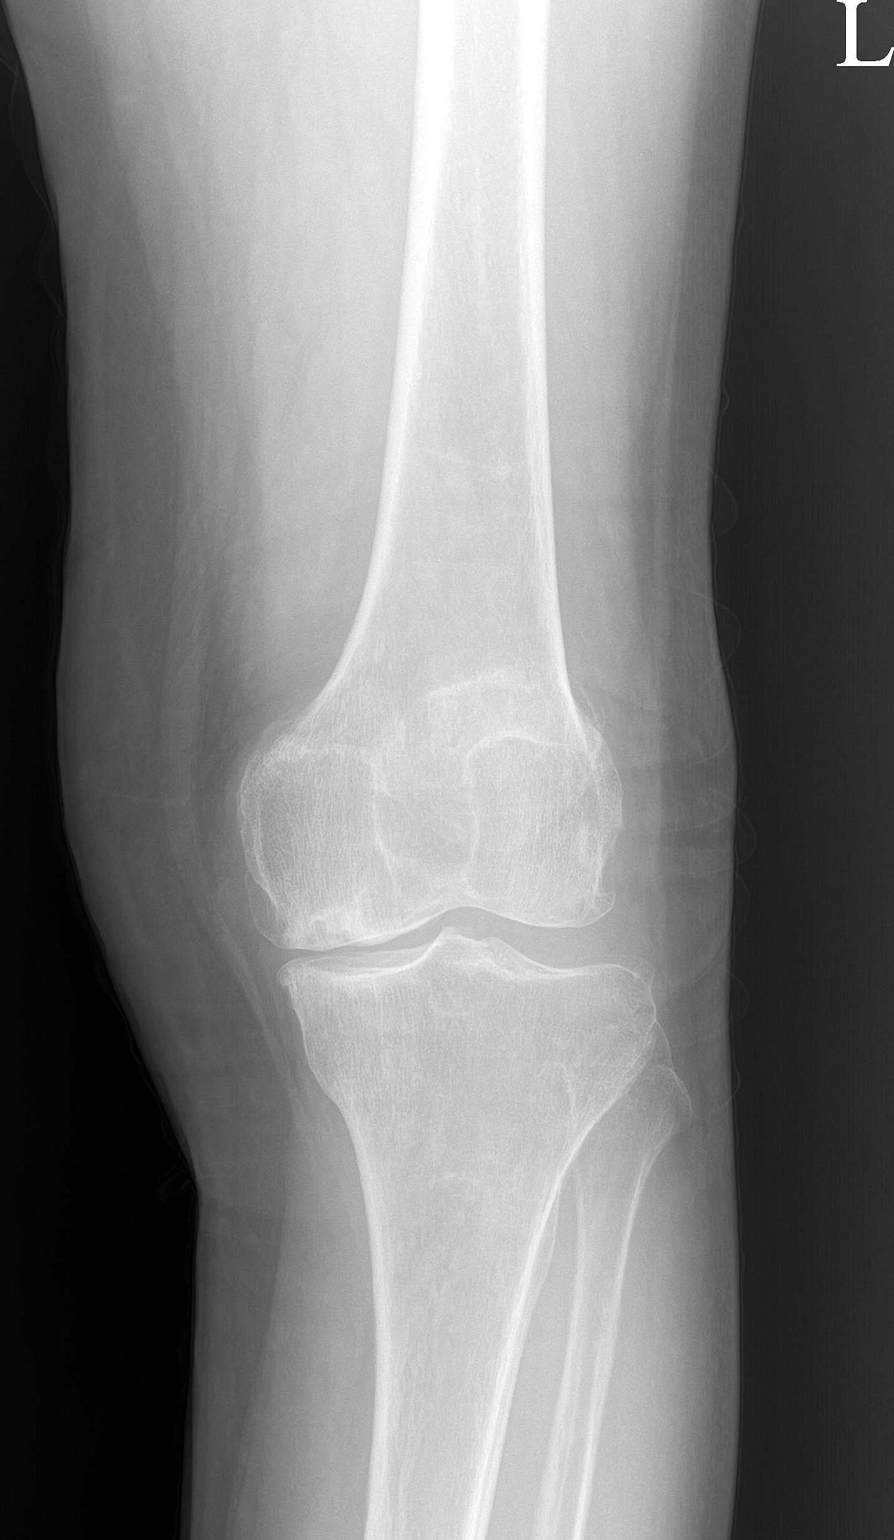

[knee lat]
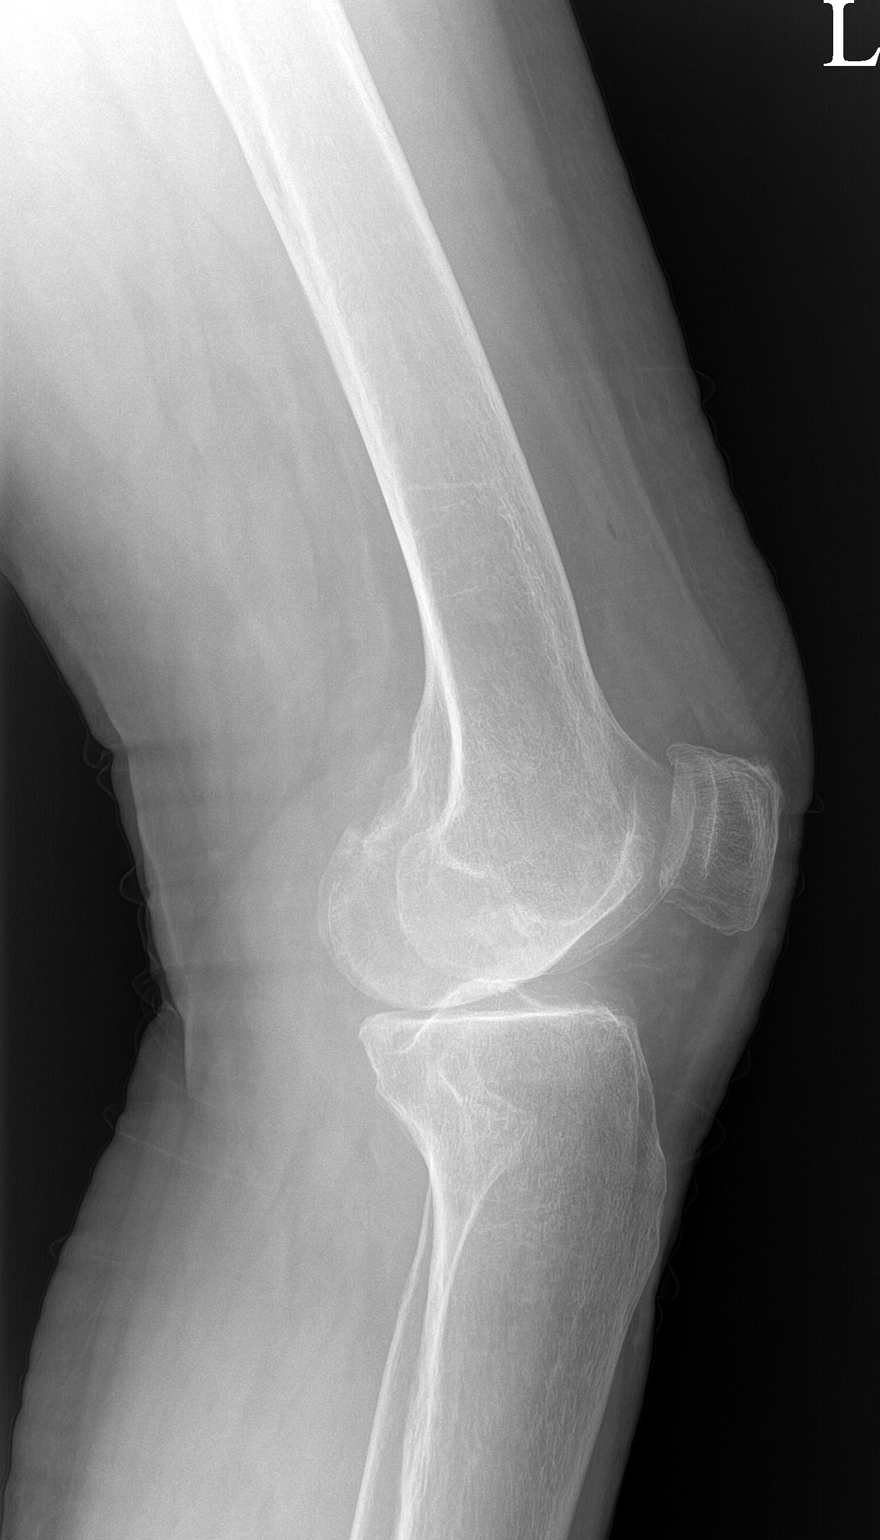

[patella]
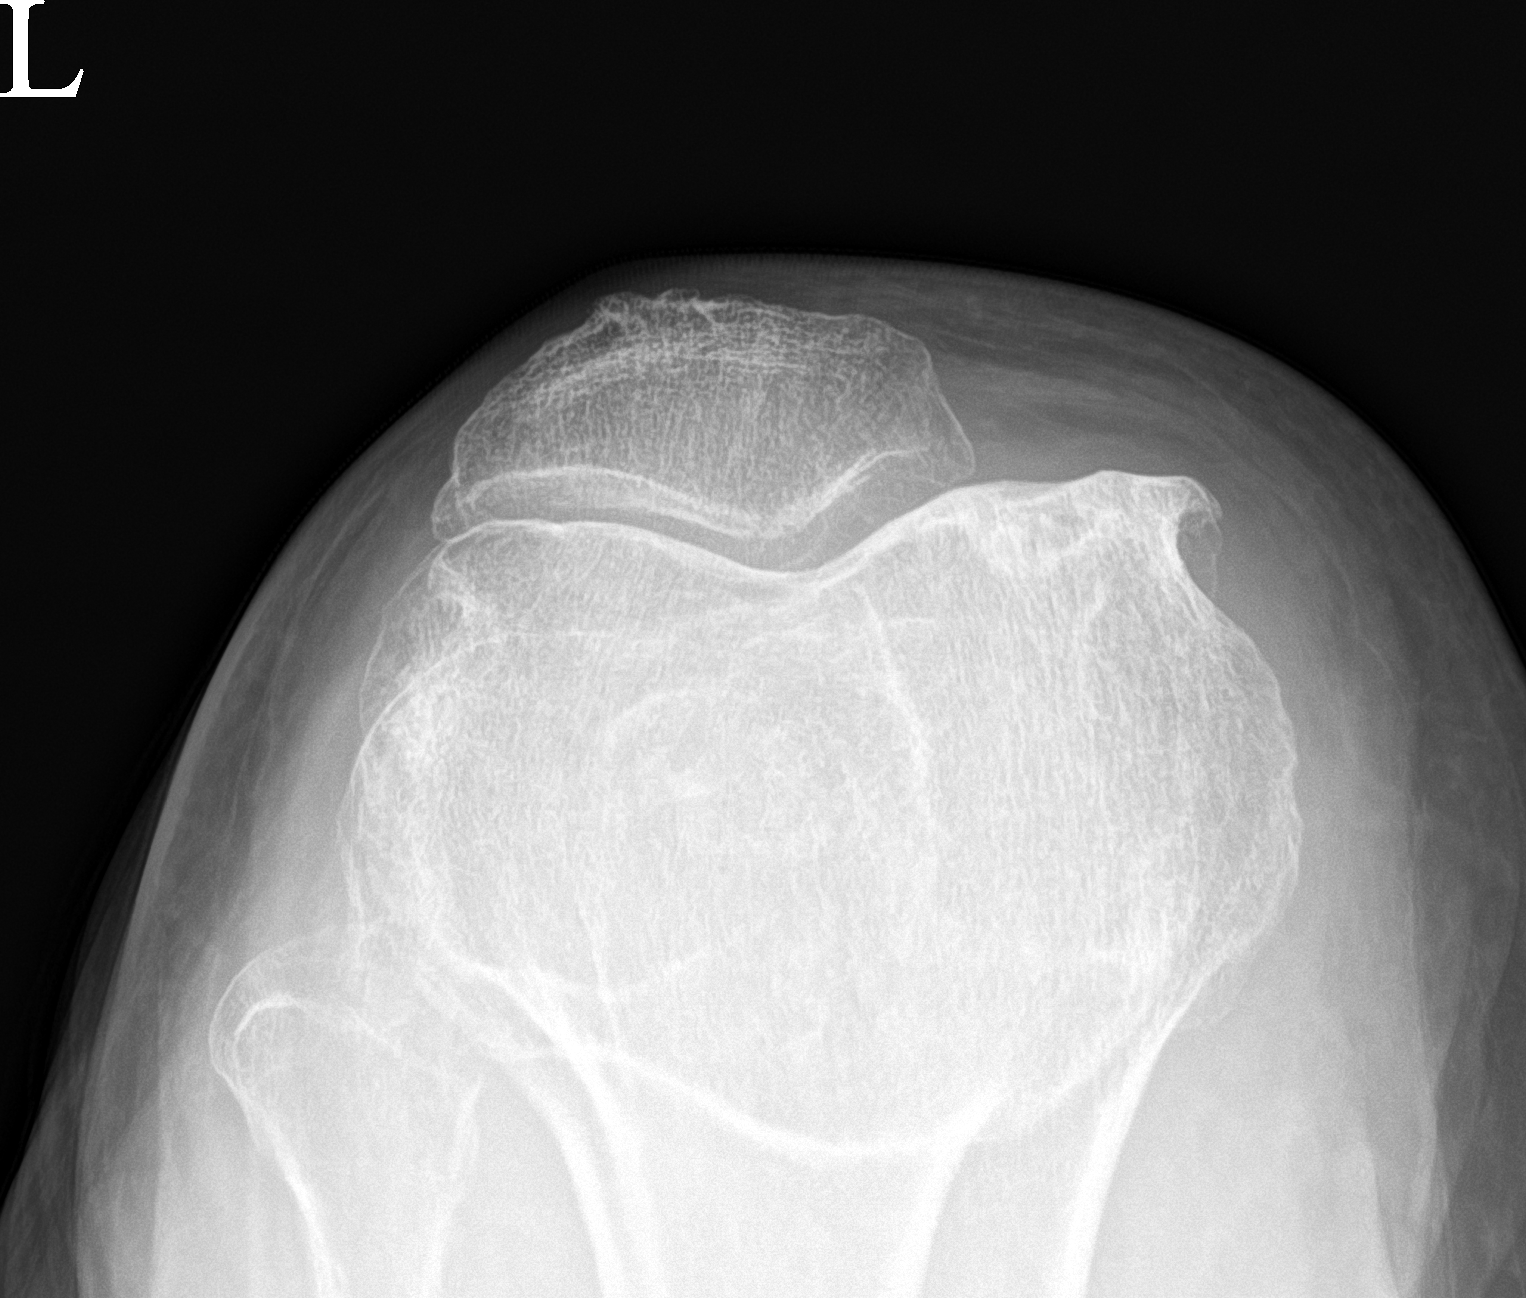

[3 of 3 positions shown; findings below may reference images not displayed]

FINDINGS: The bones are subjectively under mineralized. Slight lateral
translation of the tibia with respect to the femur. Mild
tricompartmental peripheral spurring. Subchondral lucency with
surrounding sclerosis in the medial femoral condyle. No fracture,
erosion, or bone destruction. No significant knee joint effusion.
There is a small quadriceps tendon enthesophyte.
IMPRESSION: 1. Mild tricompartmental osteoarthritis. Subchondral lucency with
surrounding sclerosis in the medial femoral condyle may represent an
osteochondral lesion or a subchondral cyst.
2. Slight lateral translation of the tibia with respect to the
femur, suggesting underlying ligamentous laxity.

## 2021-03-11 IMAGING — DX DG KNEE AP/LAT W/ SUNRISE*R*
3 series · 3 of 3 positions shown · non-contrast
Comparison: None.

CLINICAL DATA: Bilateral knee pain, left greater than right. No
known injury.

EXAM:
RIGHT KNEE 3 VIEWS

[knee ap]
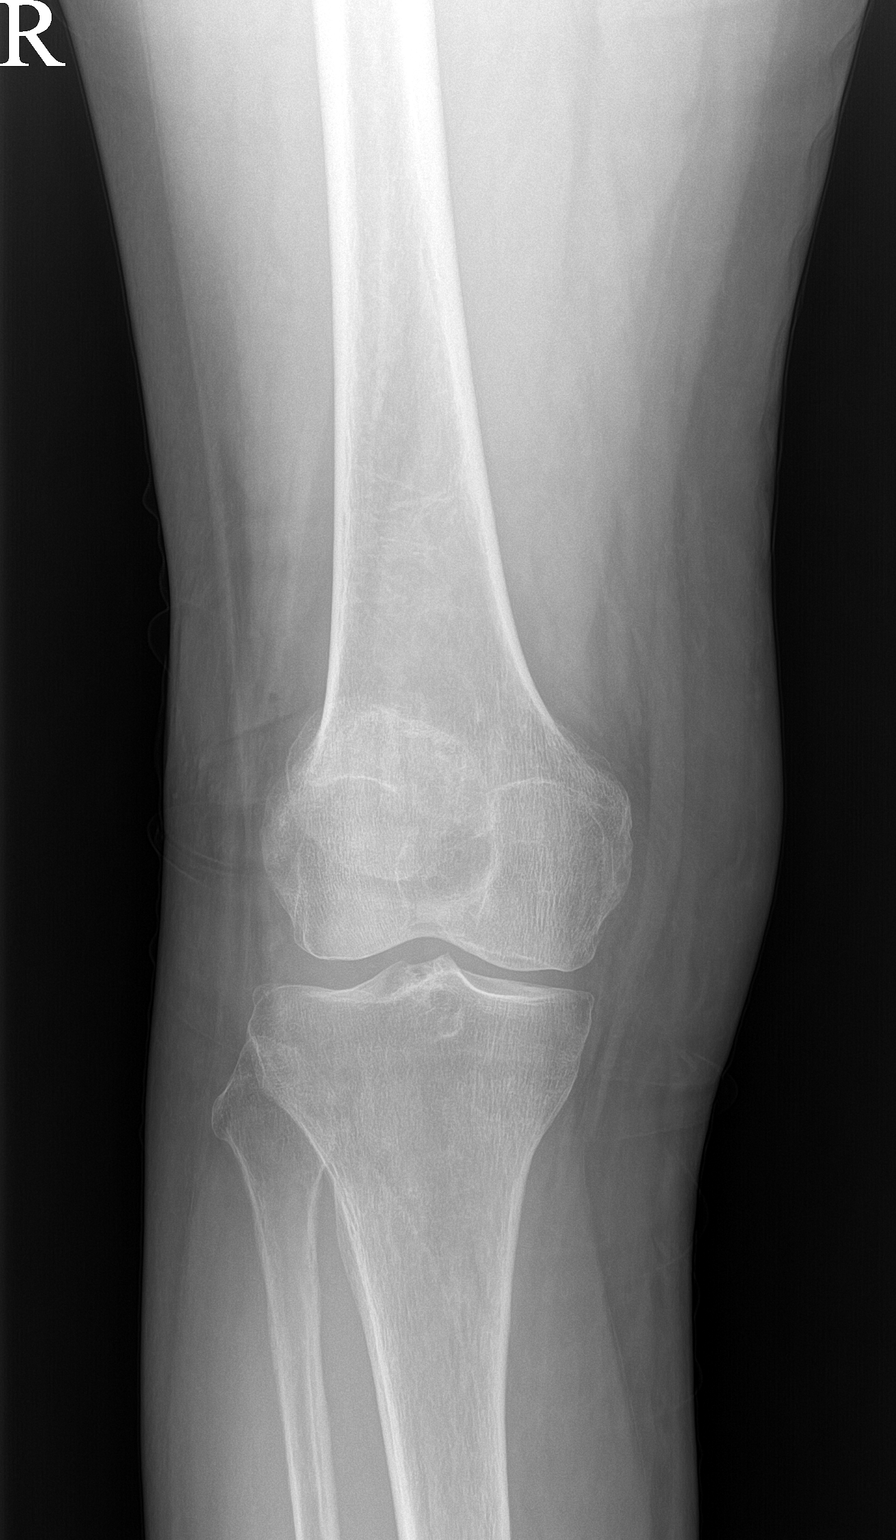

[knee lat]
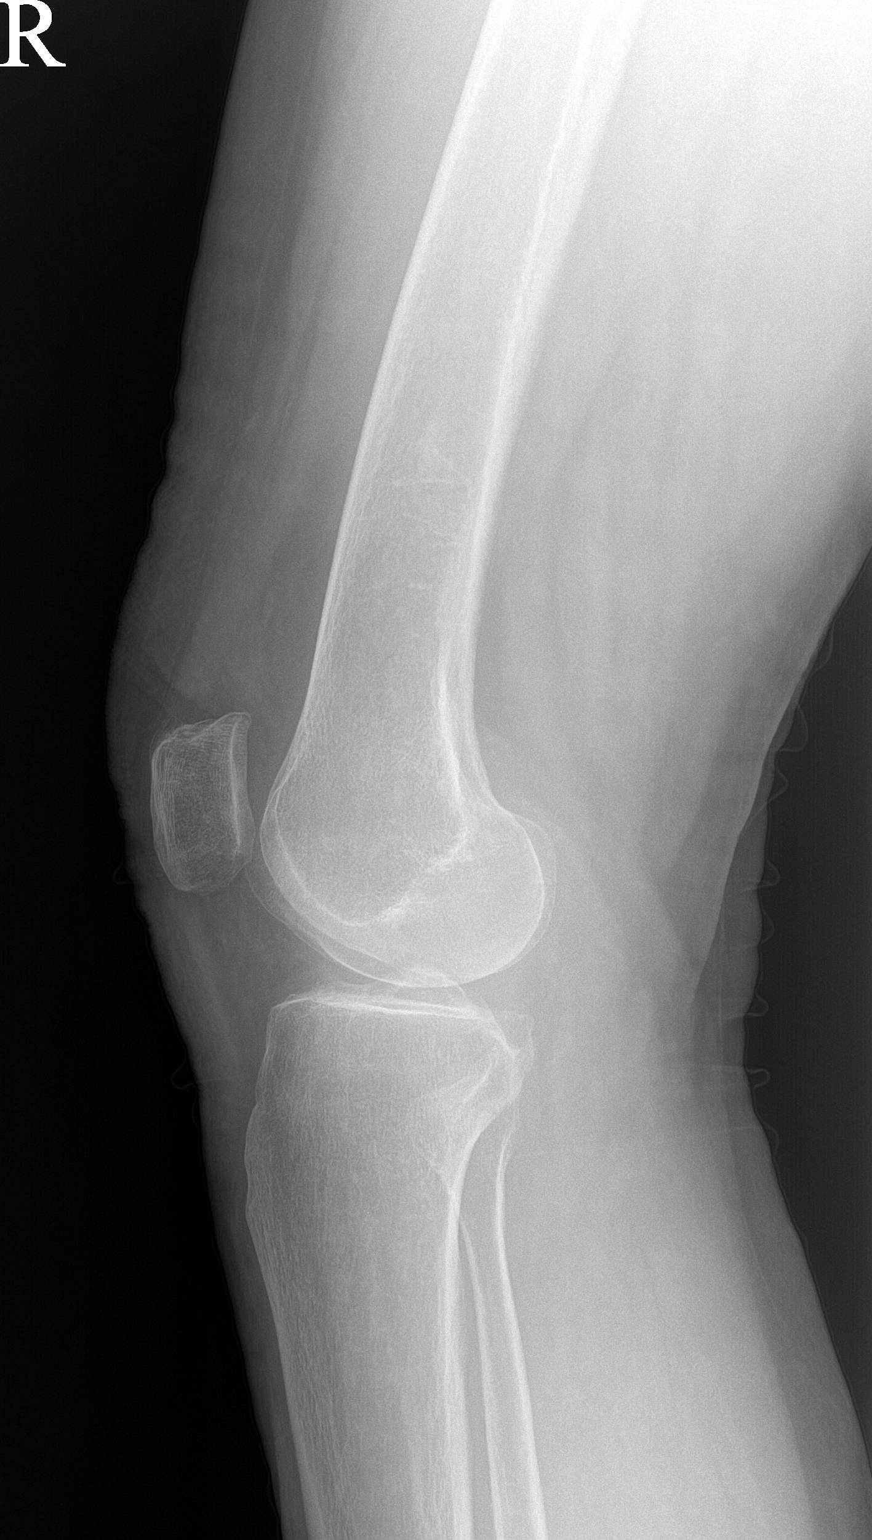

[patella]
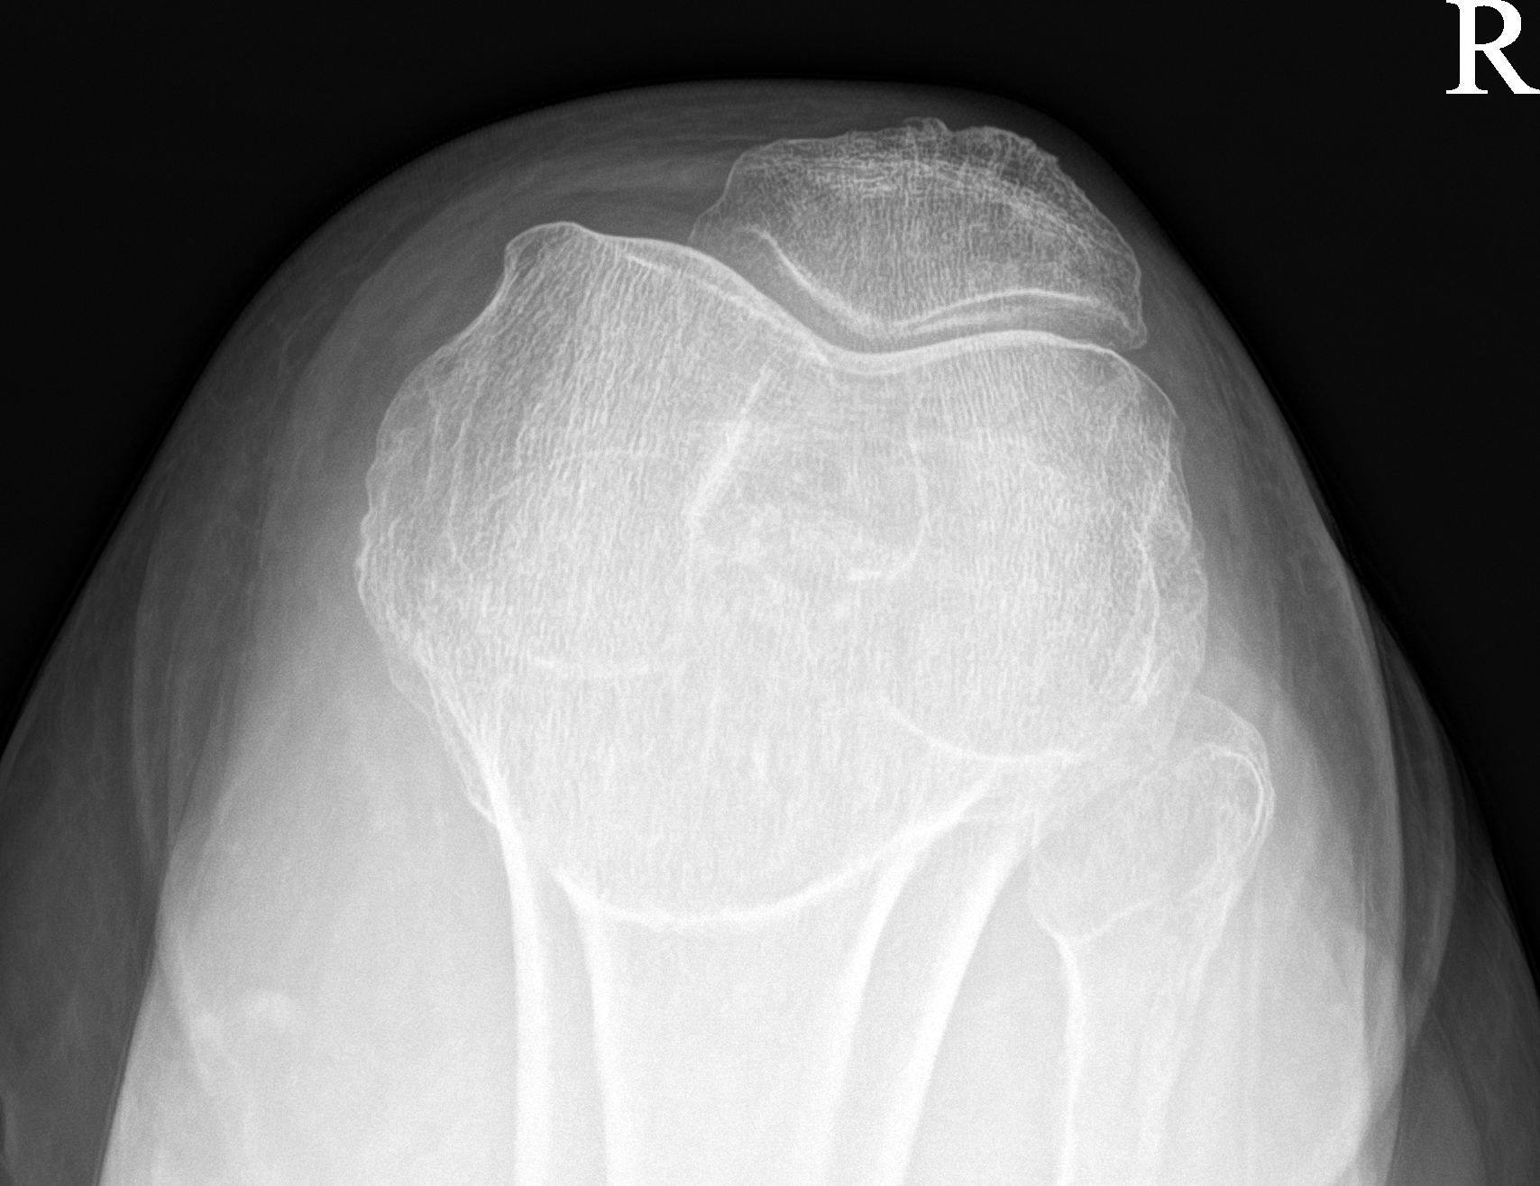

[3 of 3 positions shown; findings below may reference images not displayed]

FINDINGS: The bones are subjectively under mineralized. Normal alignment.
Joint spaces are preserved. Mild tricompartmental peripheral
spurring and spurring of the tibial spines. No fracture or erosion.
No significant knee joint effusion. Unremarkable soft tissues.
IMPRESSION: Mild tricompartmental osteoarthritis.

## 2021-03-11 NOTE — Patient Instructions (Addendum)
Thank you for coming in today.   Please get an Xray today before you leave   Please use Voltaren gel (Generic Diclofenac Gel) up to 4x daily for pain as needed.  This is available over-the-counter as both the name brand Voltaren gel and the generic diclofenac gel.   You received steroid injections in both of your knees today. Seek immediate medical attention if the joint becomes red, extremely painful, or is oozing fluid.   I've referred you to Physical Therapy.  Let us know if you don't hear from them in one week.   Recheck back in 6 weeks.

## 2021-03-11 NOTE — Progress Notes (Signed)
I, Caroline [name], LAT, ATC acting as a scribe for Caroline Graham, MD.  Subjective:    CC: Bilat knee and R ankle pain  HPI: Pt is a 70 y/o female c/o bilat knee pain x 3 months, L>R. Pt locates pain to anterior aspect of bilat knees. Pt c/o L knee "giving out" on her.  Swelling: L-yes Mechanical symptoms: no Aggravates: walking for long periods, getting up/down floor, stairs Treatments tried: Tylenol, IBU  Pt also c/o R heel pain x 3 months. Pt locates pain to posterior aspect of calcaneous over Achilles. Pt notes heel feeling warm to the touch.  R ankle swelling: yes Aggravates: back of shoe pressing on it Treatments tried: Tylenol, IBU.   Pertinent review of Systems: No fevers or chills  Relevant historical information: History left knee scope Dr. Lequita Halt about 5 or 6 years ago.   Objective:    Vitals:   03/11/21 1301  BP: 126/82  Pulse: 69  SpO2: 96%   General: Well Developed, well nourished, and in no acute distress.   MSK: Right knee normal-appearing no effusion. Normal motion with crepitation. Nontender. Stable ligamentous exam. Positive medial McMurray's test. Intact strength.  Left knee mild effusion otherwise normal-appearing Normal motion with crepitation. Tender palpation medial joint line. Stable ligamentous exam. Positive medial McMurray's test. Strength 4/5 extension otherwise intact.  Right Achilles tendon normal. Mildly tender palpation posterior calcaneus. Normal foot and ankle motion. Some pain resisted foot plantarflexion.  Lab and Radiology Results  Diagnostic Limited MSK Ultrasound of: Right Achilles tendon Achilles tendon intact. Deep to Achilles tendon is a moderate to large retrocalcaneal bursitis. Impression: Moderate to large right-sided retrocalcaneal bursitis.  Procedure: Real-time Ultrasound Guided Injection of left knee superior lateral patellar space Device: Philips Affiniti 50G Images permanently stored and available  for review in PACS Ultrasound evaluation prior to injection reveals moderate joint effusion.  Degenerative medial joint line with extruded medial meniscus.  No Baker's cyst. Verbal informed consent obtained.  Discussed risks and benefits of procedure. Warned about infection bleeding damage to structures skin hypopigmentation and fat atrophy among others. Patient expresses understanding and agreement Time-out conducted.   Noted no overlying erythema, induration, or other signs of local infection.   Skin prepped in a sterile fashion.   Local anesthesia: Topical Ethyl chloride.   With sterile technique and under real time ultrasound guidance: 40 mg of Kenalog and 2 mL of Marcaine injected into knee joint. Fluid seen entering the joint capsule.   Completed without difficulty   Pain immediately resolved suggesting accurate placement of the medication.   Advised to call if fevers/chills, erythema, induration, drainage, or persistent bleeding.   Images permanently stored and available for review in the ultrasound unit.  Impression: Technically successful ultrasound guided injection.    Procedure: Real-time Ultrasound Guided Injection of left knee superior lateral patellar space Device: Philips Affiniti 50G Images permanently stored and available for review in PACS Ultrasound evaluation prior to injection reveals minimal joint effusion.  Normal-appearing joint lines.  No Baker's cyst. Verbal informed consent obtained.  Discussed risks and benefits of procedure. Warned about infection bleeding damage to structures skin hypopigmentation and fat atrophy among others. Patient expresses understanding and agreement Time-out conducted.   Noted no overlying erythema, induration, or other signs of local infection.   Skin prepped in a sterile fashion.   Local anesthesia: Topical Ethyl chloride.   With sterile technique and under real time ultrasound guidance: 40 mg of Kenalog and 2 mL of Marcaine injected  into knee joint. Fluid seen entering the joint capsule.   Completed without difficulty   Pain immediately resolved suggesting accurate placement of the medication.   Advised to call if fevers/chills, erythema, induration, drainage, or persistent bleeding.   Images permanently stored and available for review in the ultrasound unit.  Impression: Technically successful ultrasound guided injection.  X-ray images bilateral knees obtained today personally and independently interpreted  Right knee: Mild medial and patellofemoral DJD.  No acute fractures.  Left knee: Moderate medial and mild patellofemoral DJD.  No acute fractures.  Await formal radiology review    Impression and Recommendations:    Assessment and Plan: 70 y.o. female with bilateral knee pain left worse than right.  Thought to be due to DJD exacerbation.  Plan for Voltaren gel, physical therapy to focus on quad strengthening and steroid injections as above.  Reassess in about 6 weeks.  Additionally Teena has right posterior heel pain due to retrocalcaneal bursitis.  Plan to treat with trial of Voltaren gel and PT.  Recheck in 6 weeks.  If not better consider injection.   PDMP not reviewed this encounter. Orders Placed This Encounter  Procedures   [place] LIMITED JOINT SPACE STRUCTURES LOW BILAT(NO LINKED CHARGES)    Standing Status:   Future    Number of Occurrences:   1    Standing Expiration Date:   09/09/2021    Order Specific Question:   Reason for Exam (SYMPTOM  OR DIAGNOSIS REQUIRED)    Answer:   bilateral knee pain    Order Specific Question:   Preferred imaging location?    Answer:   [facility] Knee AP/LAT W/Sunrise Left    Standing Status:   Future    Number of Occurrences:   1    Standing Expiration Date:   03/11/2022    Order Specific Question:   Reason for Exam (SYMPTOM  OR DIAGNOSIS REQUIRED)    Answer:   bilateral knee pain    Order Specific Question:   Preferred imaging location?     Answer:   [facility] Knee AP/LAT W/Sunrise Right    Standing Status:   Future    Number of Occurrences:   1    Standing Expiration Date:   03/11/2022    Order Specific Question:   Reason for Exam (SYMPTOM  OR DIAGNOSIS REQUIRED)    Answer:   bilateral knee pain    Order Specific Question:   Preferred imaging location?    Answer:   [facility]   Ambulatory referral to Physical Therapy    Referral Priority:   Routine    Referral Type:   Physical Medicine    Referral Reason:   Specialty Services Required    Requested Specialty:   Physical Therapy    Number of Visits Requested:   1   No orders of the defined types were placed in this encounter.   Discussed warning signs or symptoms. Please see discharge instructions. Patient expresses understanding.   The above documentation has been reviewed and is accurate and complete Caroline [name], M.D.

## 2021-03-13 ENCOUNTER — Telehealth: Payer: Self-pay | Admitting: Family Medicine

## 2021-03-13 DIAGNOSIS — M899 Disorder of bone, unspecified: Secondary | ICD-10-CM

## 2021-03-13 DIAGNOSIS — M25562 Pain in left knee: Secondary | ICD-10-CM

## 2021-03-13 NOTE — Progress Notes (Signed)
Knee x-ray shows arthritis but it also shows a slight abnormality in the bone that could represent something called an osteochondral lesion where the chunk of cartilage has become knocked loose. This is better evaluated with an MRI.  MRI ordered which should help tell us more.

## 2021-03-13 NOTE — Addendum Note (Signed)
Addended by: Debbe Odea R on: 03/13/2021 11:36 AM   Modules accepted: Orders

## 2021-03-13 NOTE — Progress Notes (Signed)
Right knee x-ray shows mild arthritis.

## 2021-03-13 NOTE — Telephone Encounter (Signed)
Knee MRI ordered 

## 2021-03-18 ENCOUNTER — Ambulatory Visit: Payer: Medicare HMO | Admitting: Physician Assistant

## 2021-03-25 ENCOUNTER — Other Ambulatory Visit: Payer: Self-pay

## 2021-03-25 ENCOUNTER — Ambulatory Visit: Payer: Medicare HMO | Admitting: Physical Therapy

## 2021-03-25 DIAGNOSIS — G8929 Other chronic pain: Secondary | ICD-10-CM | POA: Diagnosis not present

## 2021-03-25 DIAGNOSIS — M25562 Pain in left knee: Secondary | ICD-10-CM

## 2021-03-25 DIAGNOSIS — M25571 Pain in right ankle and joints of right foot: Secondary | ICD-10-CM | POA: Diagnosis not present

## 2021-03-25 NOTE — Therapy (Signed)
OUTPATIENT PHYSICAL THERAPY LOWER EXTREMITY EVALUATION   Patient Name: Caroline [name] MRN: [id number] DOB:03/12/1951, 70 y.o., female Today's Date: 04/02/2021   PT End of Session - 04/02/21 1457     Visit Number 1    Number of Visits 12    Date for PT Re-Evaluation 05/20/21    Authorization Type [employer]    PT Start Time 1215    PT Stop Time 1300    PT Time Calculation (min) 45 min    Activity Tolerance Patient tolerated treatment well    Behavior During Therapy WFL for tasks assessed/performed             Past Medical History:  Diagnosis Date   Allergy    Zyrtec, Benadryl   Anxiety    Zoloft since several years   Cataract    Hyperlipidemia    Hypertension    Migraines    topmax and Imitrex   Past Surgical History:  Procedure Laterality Date   ABDOMINAL HYSTERECTOMY     uterine prolapse; ovaries INTACT   BLADDER REPAIR     BREAST BIOPSY     BREAST EXCISIONAL BIOPSY     BREAST SURGERY     breast bx x 2 in 1980s; benign   CATARACT EXTRACTION, BILATERAL     Patient Active Problem List   Diagnosis Date Noted   Postcalcaneal bursitis of right foot 03/11/2021   Primary osteoarthritis of both knees 03/11/2021   Chronic pain of both knees 03/11/2021   Asthma due to seasonal allergies 11/17/2019   Osteopenia of multiple sites 10/19/2018   Chronic sinusitis 06/10/2018   Essential hypertension, benign 10/22/2017   Benign hematuria 06/17/2017   Pure hypercholesterolemia 08/11/2016   Anxiety and depression 08/11/2016   Chronic migraine without aura without status migrainosus, not intractable 08/11/2016    PCP: Margot Ables, MD  REFERRING PROVIDER: Rodolph Bong, MD  REFERRING DIAG: L knee pain   THERAPY DIAG:  Chronic pain of left knee  Pain in right ankle and joints of right foot   SUBJECTIVE:   SUBJECTIVE STATEMENT: Pt states Bil knee pain, L>R.  She is scheduled for MRI for L knee  12/29. Thinks she has had pain since 2016. Pain has been  worsened in the last 2 months. She had had L leg give out on her 2 times, causing her to fall. R knee has not given out. Increased pain/difficulty: kneeling, stairs, standing, transfers. She also has pain in R achilles region with standing and walking.  Retired, active at home.   PERTINENT HISTORY: none  PAIN:  Are you having pain? Yes VAS scale: 7/10 Pain location: knee Pain orientation: Left  PAIN TYPE: aching Pain description: intermittent  Aggravating factors: standing, transfers, stairs, kneeling Relieving factors: rest   Are you having pain? Yes VAS scale: 2-3/10 Pain location: knee Pain orientation: Right  PAIN TYPE: aching Pain description: intermittent  Aggravating factors: standing, walking , stairs  Relieving factors: rest   Are you having pain? Yes VAS scale: 3/10 Pain location: ankle  Pain orientation: Right  PAIN TYPE: aching Pain description: intermittent  Aggravating factors: standing, walking  Relieving factors: rest   PRECAUTIONS: None  WEIGHT BEARING RESTRICTIONS No  FALLS:  Has patient fallen in last 6 months? Yes, Number of falls: 1 Slipped on wet leaves.     PLOF: Independent  PATIENT GOALS :decreased knee pain and ankle pain    OBJECTIVE:     COGNITION:  Overall cognitive status: Within functional limits for tasks  assessed      PALPATION: Tenderness in L knee, joint line and around patella ,  tenderness in R achilles   LE AROM/PROM:  Hips, Knees: WFL,   Ankle: mild limitation for DF bil    LE MMT:  knees: 4/5, Hips: 4/5, Ankle: 4/5 ,    LOWER EXTREMITY SPECIAL TESTS:  Increased laxity for knee ant drawer, bilaterally  Instability in R ankle noted with SLS, and decreased heel to toe propulsion, with increased lateral sway.     TODAY'S TREATMENT: There ex: see below for HEP.    PATIENT EDUCATION:  Education details: PT POC, Exam findings, HEP Person educated: Patient Education method: Explanation, Demonstration,  Tactile cues, Verbal cues, and Handouts Education comprehension: verbalized understanding, returned demonstration, verbal cues required, tactile cues required, and needs further education   HOME EXERCISE PROGRAM: Access Code: [email or web address] URL: [email or web address] Date: 03/25/2021 Prepared by: Lyndee Hensen  Exercises Straight Leg Raise - 1 x daily - 2 sets - 10 reps Sidelying Hip Abduction - 1 x daily - 2 sets - 10 reps Seated Knee Extension AROM - 1 x daily - 2 sets - 10 reps Gastroc Stretch on Wall - 2 x daily - 3 reps - 30 hold Standing Heel Raise - 1 x daily - 2 sets - 10 reps   ASSESSMENT:  CLINICAL IMPRESSION:  Objective impairments include Abnormal gait, decreased activity tolerance, decreased balance, decreased knowledge of use of DME, decreased mobility, difficulty walking, decreased ROM, decreased strength, increased muscle spasms, and impaired flexibility. These impairments are limiting patient from cleaning, community activity, driving, meal prep, yard work, shopping, and yard work. Personal factors including Time since onset of injury/illness/exacerbation are also affecting patient's functional outcome. Patient will benefit from skilled PT to address above impairments and improve overall function.  Pt presents with primary complaint of increased pain in bil Knees, L>R. Both knees have laxity with ant drawer, some concern for instability due to knee giving out on pt  recently. She has mild weakness in hips and knees and lack of effective HEP for diagnosis. She has limited ability for sustaining standing, walking, and ability for stairs, due to pain and deficit. She also has increased pain in R achilles, will benefit from education on HEP, footwear and best care for this pain. Pt to benefit from skilled PT to improve deficits and and pain.     REHAB POTENTIAL: Good  CLINICAL DECISION MAKING: Stable/uncomplicated  EVALUATION COMPLEXITY: Low   GOALS: Goals  reviewed with patient? Yes  SHORT TERM GOALS:  SHORT TERM GOALS:  STG Name Target Date Goal status  1 Pt to be independent with initial HEP 04/08/21 INITIAL                  LONG TERM GOALS:   LTG Name  Goal status  1 Pt to be independent with final HEP   05/20/21 INITIAL  2 Pt to report decreased pain in L knee pain to 0-2/10 with activity, walking, and stairs.   05/20/21 INITIAL  3 Pt to demo ability for standing, walking for at least 30 min without pain greater than 2/10, to improve ability for IADLS and community actiivty .  05/20/21 INITIAL  4  Pt to report decreased pain in R ankle/heel to 0-2/10 with standing and walking, and be independent with HEP for ankle pain.  05/20/21 INITIAL  5  Pt to demo improved strength of bil hips and knees to at least 4+/5 , to improve stability and ability  for functional activity  05/20/21 INITIAL   6  Baseline:    7  Baseline:         PLAN: PT FREQUENCY: 1-2x/week  PT DURATION: 8 weeks  PLANNED INTERVENTIONS: Therapeutic exercises, Therapeutic activity, Neuro Muscular re-education, Balance training, Gait training, Patient/Family education, Joint mobilization, Stair training, Orthotic/Fit training, DME instructions, Dry Needling, Electrical stimulation, Spinal mobilization, Cryotherapy, Moist heat, Taping, Vasopneumatic device, Traction, Ultrasound, Ionotophoresis 4mg /ml Dexamethasone, and Manual therapy  Lyndee Hensen, PT, DPT 3:32 PM  04/02/21

## 2021-03-29 ENCOUNTER — Ambulatory Visit: Payer: Medicare HMO | Admitting: Neurology

## 2021-04-02 ENCOUNTER — Encounter: Payer: Self-pay | Admitting: Physical Therapy

## 2021-04-04 ENCOUNTER — Ambulatory Visit
Admission: RE | Admit: 2021-04-04 | Discharge: 2021-04-04 | Disposition: A | Discharge status: Home / Self Care | Payer: Medicare HMO | Source: Ambulatory Visit | Attending: Family Medicine | Admitting: Family Medicine

## 2021-04-04 ENCOUNTER — Other Ambulatory Visit: Payer: Self-pay

## 2021-04-04 DIAGNOSIS — M899 Disorder of bone, unspecified: Secondary | ICD-10-CM

## 2021-04-04 DIAGNOSIS — M25562 Pain in left knee: Secondary | ICD-10-CM

## 2021-04-04 IMAGING — MR MR KNEE*L* W/O CM
7 series · 40 of 40 positions shown · non-contrast
Comparison: X-ray knee [DATE].

CLINICAL DATA: Left knee pain and swelling for 6 months. History of
meniscus surgery [NO]. Technologist note reports osteochondritis
dissecans lesion seen on x-ray.

EXAM:
MRI OF THE LEFT KNEE WITHOUT CONTRAST
TECHNIQUE: Multiplanar, multisequence MR imaging of the knee was performed. No
intravenous contrast was administered.

[Series 6: T2 fat-sat · axial · left · 4.0mm · 0.50mm/px · z∈[-48,+103]mm · 6 of 36 slices shown (1 of 3)]
[im 1/36]
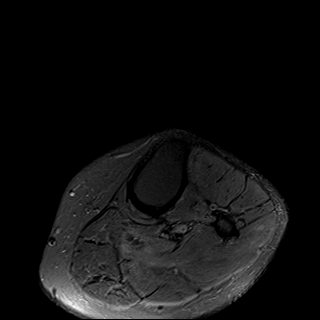
[im 8/36]
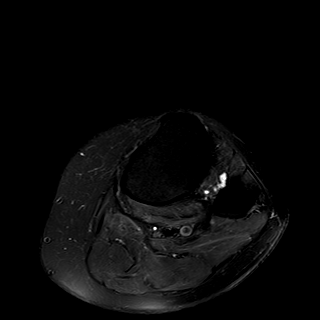
[im 15/36]
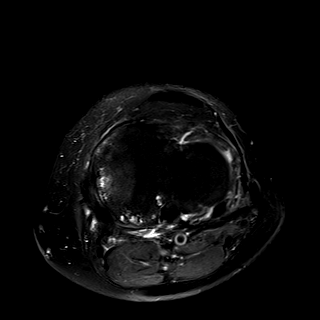
[im 22/36]
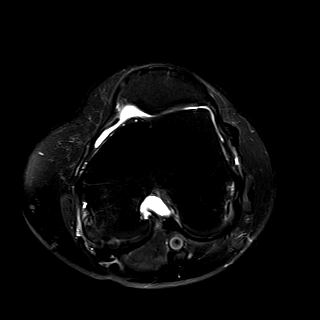
[im 29/36]
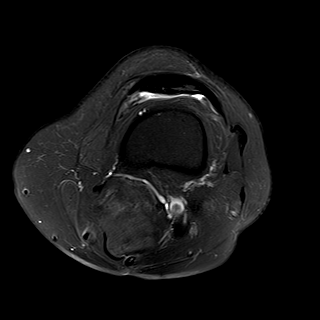
[im 36/36]
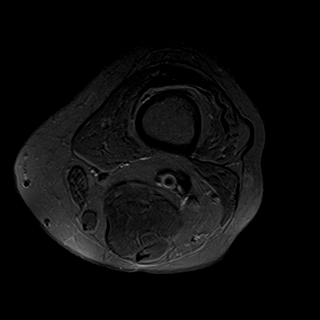

[Series 7: T2 fat-sat · coronal · left · 4.0mm · 0.47mm/px · 6 of 28 slices shown (2 of 3)]
[im 1/28]
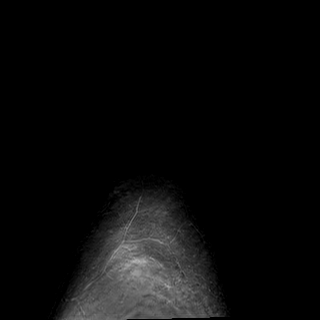
[im 6/28]
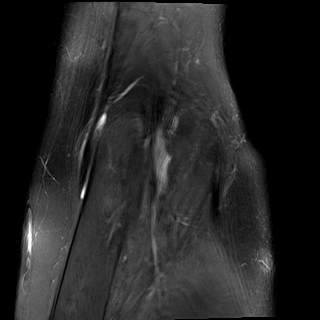
[im 11/28]
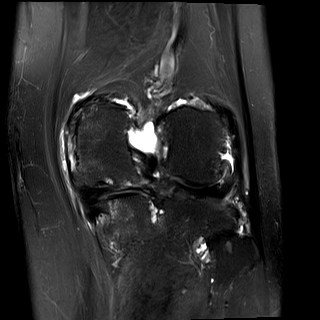
[im 17/28]
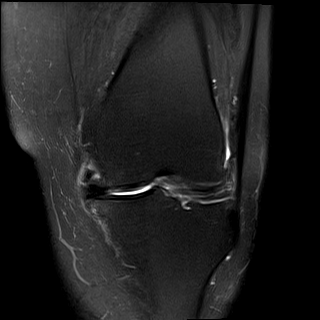
[im 22/28]
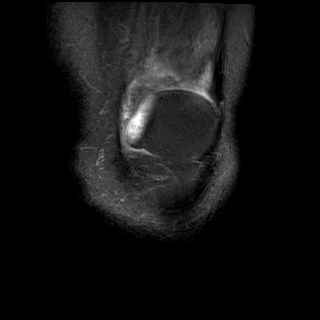
[im 28/28]
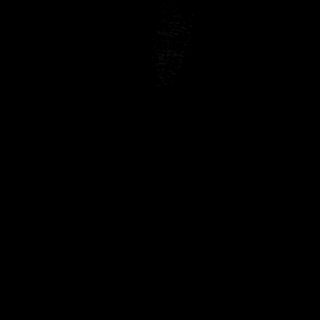

[Series 8: T1 · coronal · left · 4.0mm · 0.47mm/px · 6 of 28 slices shown]
[im 1/28]
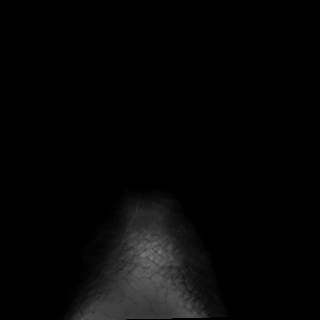
[im 6/28]
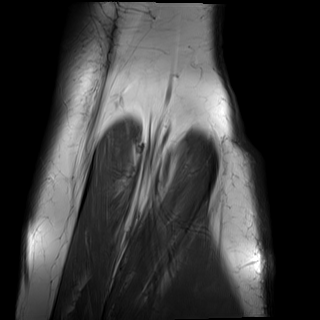
[im 11/28]
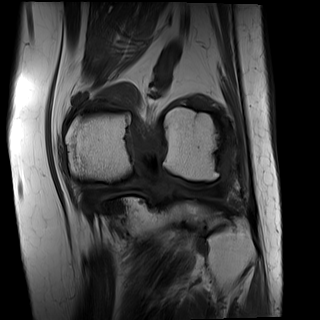
[im 17/28]
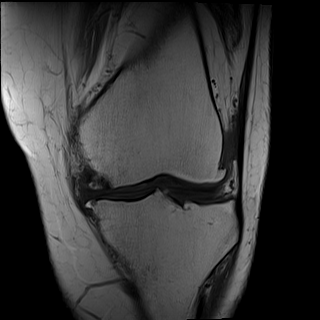
[im 22/28]
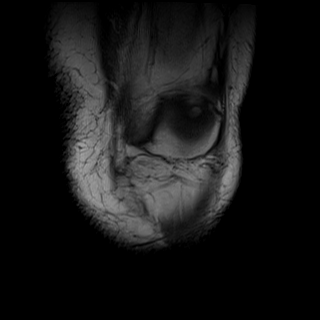
[im 28/28]
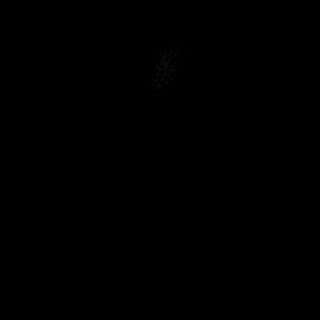

[Series 9: PD fat-sat · coronal · left · 3.0mm · 0.47mm/px · 6 of 28 slices shown (1 of 2)]
[im 1/28]
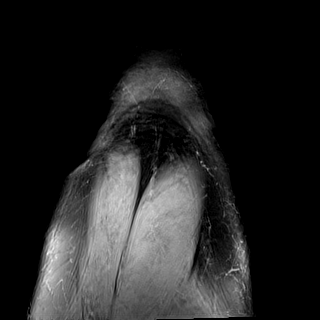
[im 6/28]
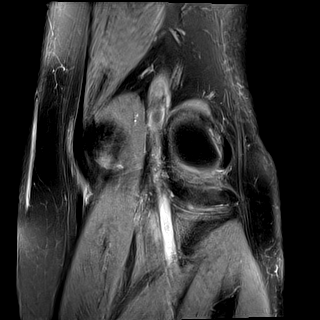
[im 11/28]
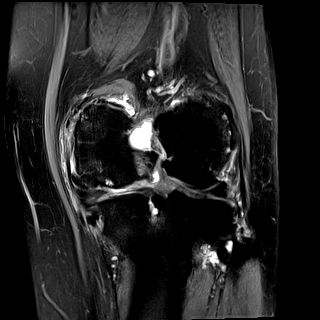
[im 17/28]
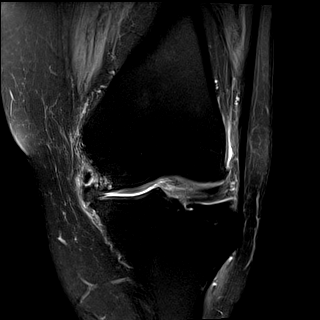
[im 22/28]
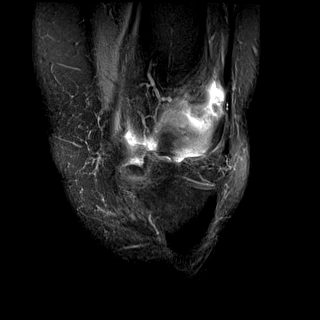
[im 28/28]
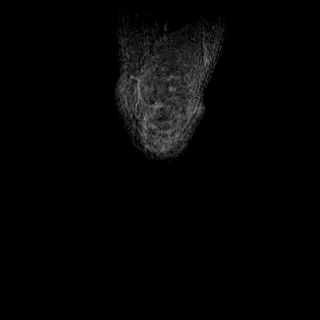

[Series 10: PD fat-sat · sagittal · left · 3.0mm · 0.39mm/px · 6 of 27 slices shown (2 of 2)]
[im 1/27]
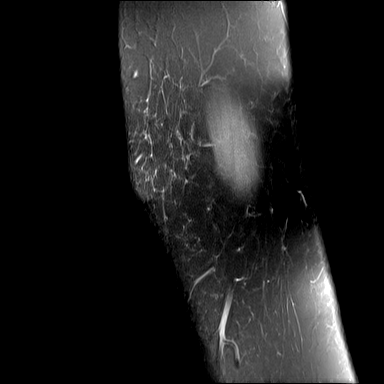
[im 6/27]
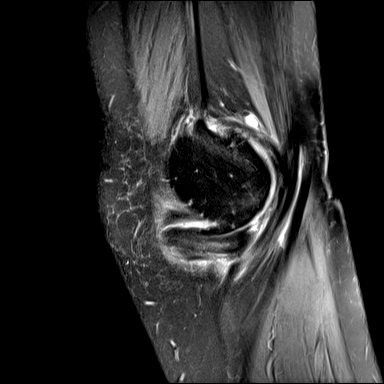
[im 11/27]
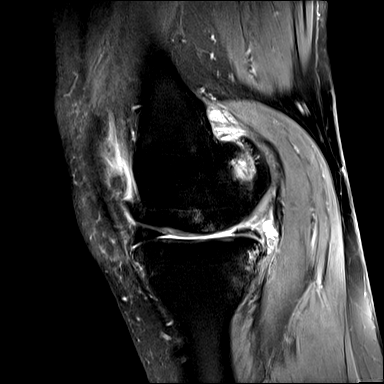
[im 16/27]
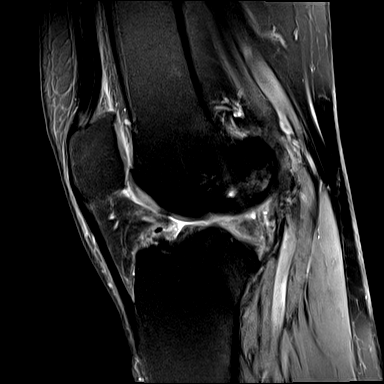
[im 21/27]
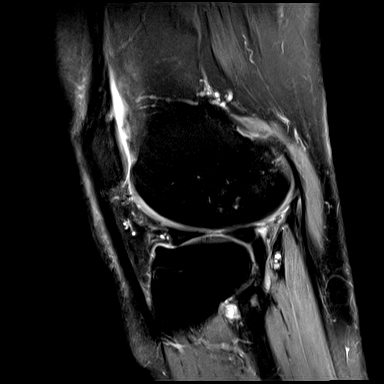
[im 27/27]
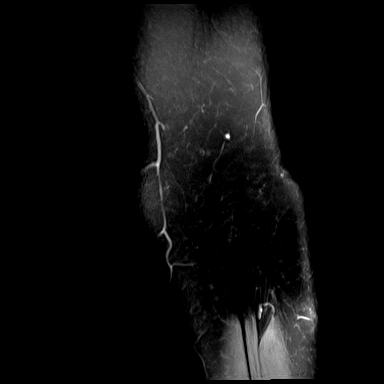

[Series 11: T2 fat-sat · sagittal · left · 3.0mm · 0.39mm/px · 6 of 27 slices shown (3 of 3)]
[im 1/27]
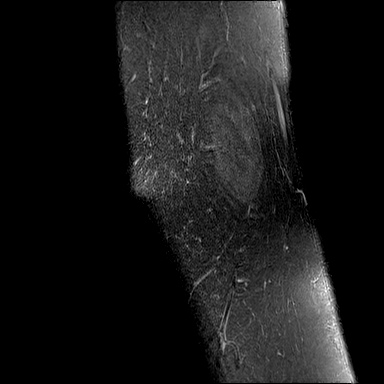
[im 6/27]
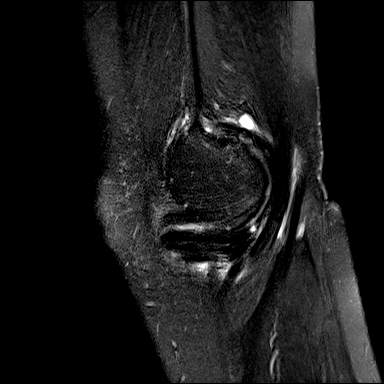
[im 11/27]
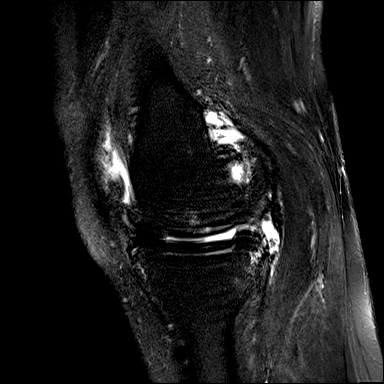
[im 16/27]
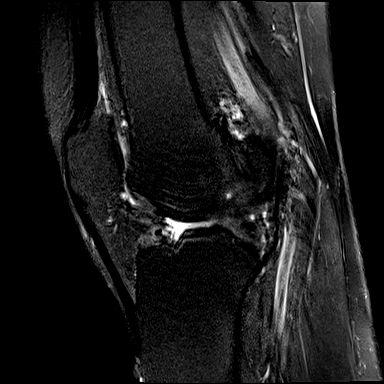
[im 21/27]
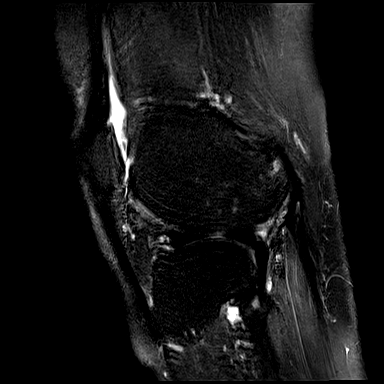
[im 27/27]
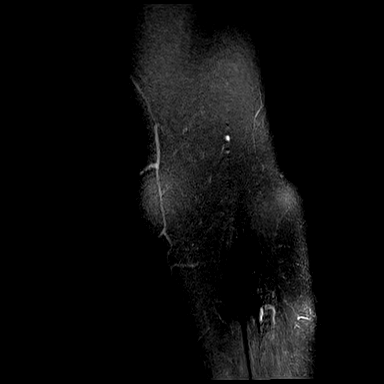

[Series 12: PD · oblique · left · 1.5mm · 0.44mm/px · 4 of 21 slices shown]
[im 1/21]
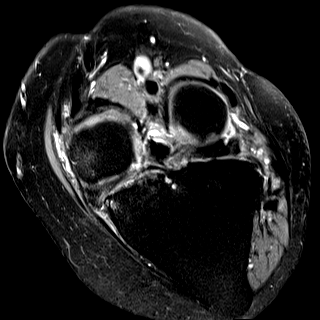
[im 7/21]
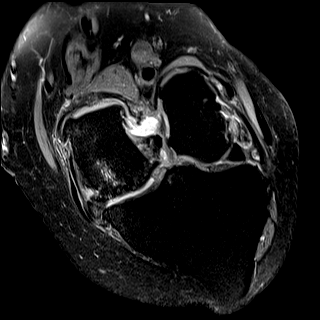
[im 14/21]
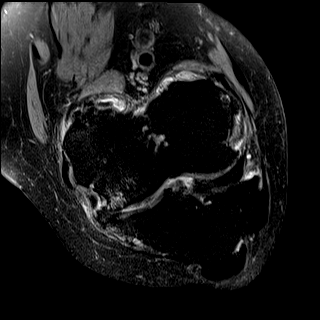
[im 21/21]
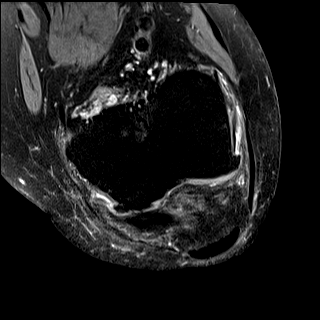

[40 of 40 positions shown; findings below may reference images not displayed]

FINDINGS: MENISCI

Medial: There is complex upper surface tearing of the posterior horn
of the medial meniscus, with extensive intrasubstance degenerative
signal and probable degenerative tearing in the medial meniscal
body. Mild medial meniscal extrusion.

Lateral: Indistinct appearance of the posterior root of the lateral
meniscus, possibly torn.

LIGAMENTS

Cruciates: Chronic ACL tear with slight anterior translation of the
tibia with respect to the femur and bowing of the PCL. The PCL is
intact.

Collaterals: Mild bowing of the MCL from mild medial meniscal
extrusion. The MCL is otherwise intact. Lateral collateral ligament
complex is intact.

CARTILAGE

Patellofemoral:  Mild chondrosis.

Medial: High-grade and full-thickness cartilage loss along the
weight-bearing surfaces of the medial femoral condyle and tibial
plateau. Subchondral marrow edema and cystic change. Small probable
chronic osteochondral defect along the medial femoral condyle.

Lateral:  Mild chondrosis.

JOINT: Small joint effusion.

POPLITEAL FOSSA: Miniscule Baker's cyst.

EXTENSOR MECHANISM: Intact quadriceps tendon. Intact patellar
tendon.

BONES: Bony findings as described above. No aggressive osseous
lesion. Slight anterior translation of the tibia with respect to the
femur consistent with ACL tear.

Other: There is ganglion formation at the origin of the medial
gastrocnemius at the femur. No focal fluid collection.
IMPRESSION: 1. Upper surface tearing of the posterior horn of the medial
meniscus with extensive intrasubstance degenerative signal and
probable degenerative tearing of the medial meniscal body.
2. Indistinct appearance of the posterior root of the lateral
meniscus, possibly torn.
3. Tricompartment osteoarthritis, severe in the medial compartment
with full-thickness cartilage loss along the weight-bearing
surfaces, and probable small chronic osteochondral defect on the
medial femoral condyle.
4. Chronic ACL tear.

## 2021-04-04 NOTE — Therapy (Signed)
OUTPATIENT PHYSICAL THERAPY TREATMENT NOTE   Patient Name: Caroline [name] MRN: [id number] DOB:1950-11-15, 70 y.o., female Today's Date: 04/05/2021  PCP: Margot Ables, MD REFERRING PROVIDER: Margot Ables*   PT End of Session - 04/05/21 [phone]     Visit Number 2    Number of Visits 12    Date for PT Re-Evaluation 05/06/21    Authorization Type [employer]    PT Start Time 0925    PT Stop Time 1005    PT Time Calculation (min) 40 min    Activity Tolerance Patient tolerated treatment well    Behavior During Therapy WFL for tasks assessed/performed             Past Medical History:  Diagnosis Date   Allergy    Zyrtec, Benadryl   Anxiety    Zoloft since several years   Cataract    Hyperlipidemia    Hypertension    Migraines    topmax and Imitrex   Past Surgical History:  Procedure Laterality Date   ABDOMINAL HYSTERECTOMY     uterine prolapse; ovaries INTACT   BLADDER REPAIR     BREAST BIOPSY     BREAST EXCISIONAL BIOPSY     BREAST SURGERY     breast bx x 2 in 1980s; benign   CATARACT EXTRACTION, BILATERAL     Patient Active Problem List   Diagnosis Date Noted   Postcalcaneal bursitis of right foot 03/11/2021   Primary osteoarthritis of both knees 03/11/2021   Chronic pain of both knees 03/11/2021   Asthma due to seasonal allergies 11/17/2019   Osteopenia of multiple sites 10/19/2018   Chronic sinusitis 06/10/2018   Essential hypertension, benign 10/22/2017   Benign hematuria 06/17/2017   Pure hypercholesterolemia 08/11/2016   Anxiety and depression 08/11/2016   Chronic migraine without aura without status migrainosus, not intractable 08/11/2016    REFERRING DIAG:  L knee pain   THERAPY DIAG:  Chronic pain of left knee  Difficulty in walking, not elsewhere classified  Muscle weakness (generalized)   REFERRING PROVIDER: Rodolph Bong, MD      SUBJECTIVE:    SUBJECTIVE STATEMENT: States she has been having some soreness but didn't  have papers for HEP. States she has been riding her bike   PERTINENT HISTORY: none   PAIN:  Are you having pain? Yes VAS scale: 3/10 Pain location: knee Pain orientation: Left  PAIN TYPE: aching, soreness Pain description: intermittent  Aggravating factors: standing, transfers, stairs, kneeling Relieving factors: rest    Are you having pain? Yes VAS scale: 3/10 Pain location: knee Pain orientation: Right  PAIN TYPE: aching, soreness Pain description: intermittent  Aggravating factors: standing, walking , stairs  Relieving factors: rest    Are you having pain? Yes VAS scale: 5/10 Pain location: ankle, heel  Pain orientation: Right  PAIN TYPE: aching Pain description: intermittent  Aggravating factors: standing, walking  Relieving factors: rest     PRECAUTIONS: None   WEIGHT BEARING RESTRICTIONS No   FALLS:  Has patient fallen in last 6 months? Yes, Number of falls: 1 Slipped on wet leaves.        PLOF: Independent   PATIENT GOALS :decreased knee pain and ankle pain      OBJECTIVE: carried from eval       COGNITION:          Overall cognitive status: Within functional limits for tasks assessed  PALPATION:04/05/21 Tenderness in L knee posterior knee with swelling noted posteriorly and medially. Tenderness to left medial and lateral sides of right ankle with medial worse than lateral   LE AROM/PROM:  Hips, Knees: WFL,   Ankle: mild limitation for DF bil      LE MMT:  knees: 4/5, Hips: 4/5, Ankle: 4/5 ,       LOWER EXTREMITY SPECIAL TESTS:  Increased laxity for knee ant drawer, bilaterally           Instability in R ankle noted with SLS, and decreased heel to toe propulsion, with increased lateral sway.        TODAY'S TREATMENT: 04/05/21 Therapeutic Exercise:  Aerobic: Supine: bridges 5x5 5" holds, heel slides x10 Bilat, thomas stretch x2 bilat 30" holds  Seated: LAQS x5 5" holds B   Standing: gastroc  stretch at wall  x2 30" holds, heel raise with holds x5 5" holds at counter double leg, Neuromuscular Re-education: Manual Therapy:  STM and edema massage to left posterior knee, and STM to right ankle medial and lateral aspects  Therapeutic Activity: Self Care: Trigger Point Dry Needling:  Modalities:       PATIENT EDUCATION:  Education details: rationale for exercises, elevation and possible benefits of compression garments, HEP Person educated: Patient Education method: Explanation, Demonstration, Tactile cues, Verbal cues, and Handouts Education comprehension: verbalized understanding, returned demonstration, verbal cues required, tactile cues required, and needs further education     HOME EXERCISE PROGRAM: Access Code: [email or web address]      ASSESSMENT:   CLINICAL IMPRESSION:  Objective impairments include Abnormal gait, decreased activity tolerance, decreased balance, decreased knowledge of use of DME, decreased mobility, difficulty walking, decreased ROM, decreased strength, increased muscle spasms, and impaired flexibility. These impairments are limiting patient from cleaning, community activity, driving, meal prep, yard work, shopping, and yard work. Personal factors including Time since onset of injury/illness/exacerbation are also affecting patient's functional outcome. Patient will benefit from skilled PT to address above impairments and improve overall function.   Session focused on review of HEP secondary to patient misplacing handouts from initial evaluation. Good form for all exercises without increase in pain/symptoms. Notable swelling in left knee and focused on edema massage and STM to medial hamstring which was tolerated well. Educated patient in elevation and possible benefits of compression garment which will be considered if swelling persists. Added bridges to HEP and will continue with stability strengthening secondary to notable instability throughout left knee. Will continue with current  POC.        REHAB POTENTIAL: Good   CLINICAL DECISION MAKING: Stable/uncomplicated   EVALUATION COMPLEXITY: Low     GOALS: Goals reviewed with patient? Yes   SHORT TERM GOALS:   SHORT TERM GOALS:   STG Name Target Date Goal status  1 Pt to be independent with initial HEP 04/08/21 INITIAL                               LONG TERM GOALS:    LTG Name   Goal status  1 Pt to be independent with final HEP    05/20/21 INITIAL  2 Pt to report decreased pain in L knee pain to 0-2/10 with activity, walking, and stairs.    05/20/21 INITIAL  3 Pt to demo ability for standing, walking for at least 30 min without pain greater than 2/10, to improve ability for IADLS and community actiivty .  05/20/21 INITIAL  4  Pt to report decreased pain in R ankle/heel to 0-2/10 with standing and walking, and be independent with HEP for ankle pain.  05/20/21 INITIAL  5   Pt to demo improved strength of bil hips and knees to at least 4+/5 , to improve stability and ability for functional activity  05/20/21 INITIAL   6   Baseline:      7   Baseline:                PLAN: PT FREQUENCY: 1-2x/week   PT DURATION: 8 weeks   PLANNED INTERVENTIONS: Therapeutic exercises, Therapeutic activity, Neuro Muscular re-education, Balance training, Gait training, Patient/Family education, Joint mobilization, Stair training, Orthotic/Fit training, DME instructions, Dry Needling, Electrical stimulation, Spinal mobilization, Cryotherapy, Moist heat, Taping, Vasopneumatic device, Traction, Ultrasound, Ionotophoresis 4mg /ml Dexamethasone, and Manual therapy     10:24 AM, 04/05/21 Jerene Pitch, DPT Physical Therapy with [facility]  [phone] office

## 2021-04-05 ENCOUNTER — Ambulatory Visit (INDEPENDENT_AMBULATORY_CARE_PROVIDER_SITE_OTHER): Payer: Medicare HMO | Admitting: Physical Therapy

## 2021-04-05 ENCOUNTER — Encounter: Payer: Self-pay | Admitting: Physical Therapy

## 2021-04-05 DIAGNOSIS — G8929 Other chronic pain: Secondary | ICD-10-CM | POA: Diagnosis not present

## 2021-04-05 DIAGNOSIS — M6281 Muscle weakness (generalized): Secondary | ICD-10-CM | POA: Diagnosis not present

## 2021-04-05 DIAGNOSIS — R262 Difficulty in walking, not elsewhere classified: Secondary | ICD-10-CM

## 2021-04-05 DIAGNOSIS — M25562 Pain in left knee: Secondary | ICD-10-CM

## 2021-04-05 NOTE — Progress Notes (Signed)
Dominant finding on the knee MRI is arthritis severe in the medial compartment with full-thickness cartilage loss.  This is almost certainly the main source of your knee pain and despite the other issues discussed to the MRI will be the main issue going forward.  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  If you did not have arthritis this can be addressed surgically however given the severity of your knee arthritis surgery to address these issues without addressing the arthritis is in my opinion not a good idea.  We may want to consider trial of gel injections.  If you would like me to work on authorization of these gel injections please let me know.  These injections do require prior authorization.  The surgery that could be used to resolve your knee pain would probably be a knee replacement in my opinion.  This is due to the significant arthritis seen on the MRI.

## 2021-04-09 ENCOUNTER — Ambulatory Visit (INDEPENDENT_AMBULATORY_CARE_PROVIDER_SITE_OTHER): Payer: Medicare HMO | Admitting: Family Medicine

## 2021-04-09 ENCOUNTER — Ambulatory Visit: Payer: Self-pay

## 2021-04-09 ENCOUNTER — Telehealth: Payer: Self-pay

## 2021-04-09 ENCOUNTER — Other Ambulatory Visit: Payer: Self-pay

## 2021-04-09 VITALS — BP 108/72 | HR 84 | Ht 62.0 in | Wt 145.0 lb

## 2021-04-09 DIAGNOSIS — M25562 Pain in left knee: Secondary | ICD-10-CM

## 2021-04-09 DIAGNOSIS — M1712 Unilateral primary osteoarthritis, left knee: Secondary | ICD-10-CM | POA: Diagnosis not present

## 2021-04-09 NOTE — Patient Instructions (Addendum)
Thank you for coming in today.   You received an injection today. Seek immediate medical attention if the joint becomes red, extremely painful, or is oozing fluid.   Please schedule your 2nd Orthovisc injection for next week and the 3rd injection for the week after that.

## 2021-04-09 NOTE — Telephone Encounter (Signed)
Left pt a VM regarding her scheduled visit for this afternoon. I am currently running the pt for authorization of gel shots and she need to be contacted and r/s once I get authorization and we have the proper gel shots in stock.   If pt calls back. Please cancel her visit for today, 1/3 and let her know I will contact her shortly to r/s.

## 2021-04-09 NOTE — Progress Notes (Signed)
I, [name], LAT, ATC acting as a scribe for Clementeen Graham, MD.  Caroline [name] is a 71 y.o. female who presents to [facility] at [facility] today for f/u chronic bilat knee pain, L>R and L knee MRI review. Pt was last seen by Dr. Denyse Amass on 03/11/21 and was given a L knee steroid injection and was advised to use Voltaren gel and was referred to PT, completing 2 visits. Today, pt reports L knee is still very painful. No improvement w/ PT.   Dx imaging: 04/04/21 L knee MRI  03/11/21 R & L knee XR  Pertinent review of systems: No fevers or chills  Relevant historical information: Hypertension   Exam:  BP 108/72    Pulse 84    Ht 5\' 2"  (1.575 m)    Wt 145 lb (65.8 kg)    SpO2 97%    BMI 26.52 kg/m  General: Well Developed, well nourished, and in no acute distress.   MSK: Left knee tender palpation medial joint line.    Lab and Radiology Results EXAM: MRI OF THE LEFT KNEE WITHOUT CONTRAST   TECHNIQUE: Multiplanar, multisequence MR imaging of the knee was performed. No intravenous contrast was administered.   COMPARISON:  X-ray knee 03/11/2021.   FINDINGS: MENISCI   Medial: There is complex upper surface tearing of the posterior horn of the medial meniscus, with extensive intrasubstance degenerative signal and probable degenerative tearing in the medial meniscal body. Mild medial meniscal extrusion.   Lateral: Indistinct appearance of the posterior root of the lateral meniscus, possibly torn.   LIGAMENTS   Cruciates: Chronic ACL tear with slight anterior translation of the tibia with respect to the femur and bowing of the PCL. The PCL is intact.   Collaterals: Mild bowing of the MCL from mild medial meniscal extrusion. The MCL is otherwise intact. Lateral collateral ligament complex is intact.   CARTILAGE   Patellofemoral:  Mild chondrosis.   Medial: High-grade and full-thickness cartilage loss along the weight-bearing surfaces of the medial  femoral condyle and tibial plateau. Subchondral marrow edema and cystic change. Small probable chronic osteochondral defect along the medial femoral condyle.   Lateral:  Mild chondrosis.   JOINT: Small joint effusion.   POPLITEAL FOSSA: Miniscule Baker's cyst.   EXTENSOR MECHANISM: Intact quadriceps tendon. Intact patellar tendon.   BONES: Bony findings as described above. No aggressive osseous lesion. Slight anterior translation of the tibia with respect to the femur consistent with ACL tear.   Other: There is ganglion formation at the origin of the medial gastrocnemius at the femur. No focal fluid collection.   IMPRESSION: 1. Upper surface tearing of the posterior horn of the medial meniscus with extensive intrasubstance degenerative signal and probable degenerative tearing of the medial meniscal body. 2. Indistinct appearance of the posterior root of the lateral meniscus, possibly torn. 3. Tricompartment osteoarthritis, severe in the medial compartment with full-thickness cartilage loss along the weight-bearing surfaces, and probable small chronic osteochondral defect on the medial femoral condyle. 4. Chronic ACL tear.     Electronically Signed   By: 14/08/2020 M.D.   On: 04/04/2021 13:31   I, 04/06/2021, personally (independently) visualized and performed the interpretation of the images attached in this note.   Orthovisc left knee 1/3 Procedure: Real-time Ultrasound Guided Injection of left knee superior lateral patellar space Device: Philips Affiniti 50G Images permanently stored and available for review in PACS Verbal informed consent obtained.  Discussed risks and benefits of procedure. Warned  about infection bleeding damage to structures skin hypopigmentation and fat atrophy among others. Patient expresses understanding and agreement Time-out conducted.   Noted no overlying erythema, induration, or other signs of local infection.   Skin prepped in a sterile  fashion.   Local anesthesia: Topical Ethyl chloride.   With sterile technique and under real time ultrasound guidance: Orthovisc 30 mg injected into knee joint. Fluid seen entering the joint capsule.   Completed without difficulty   Advised to call if fevers/chills, erythema, induration, drainage, or persistent bleeding.   Images permanently stored and available for review in the ultrasound unit.  Impression: Technically successful ultrasound guided injection.  Lot number: [id number]  Assessment and Plan: 71 y.o. female with left knee pain primarily due to the significant medial compartment DJD seen on MRI.  She does have a medial meniscus tear and a chronic ACL tear which are less of an issue.  Given the severity of her DJD I do not think a more minor surgery such as meniscus debridement or ACL reconstruction will help.  In the future if surgery is needed ultimately I think she is going to need a knee replacement. Plan to start Orthovisc series today.  Return in 1 week for Orthovisc injection 2/3.   PDMP not reviewed this encounter. Orders Placed This Encounter  Procedures   [place] LIMITED JOINT SPACE STRUCTURES LOW LEFT(NO LINKED CHARGES)    Standing Status:   Future    Number of Occurrences:   1    Standing Expiration Date:   10/07/2021    Order Specific Question:   Reason for Exam (SYMPTOM  OR DIAGNOSIS REQUIRED)    Answer:   left knee pain    Order Specific Question:   Preferred imaging location?    Answer:   [facility]   No orders of the defined types were placed in this encounter.    Discussed warning signs or symptoms. Please see discharge instructions. Patient expresses understanding.   The above documentation has been reviewed and is accurate and complete Clementeen Graham, M.D.   Total encounter time 20 minutes including face-to-face time with the patient and, reviewing past medical record, and charting on the date of service.   Reviewed MRI findings discussed  treatment plan and options.  Time-based billing excludes time to perform injection.

## 2021-04-11 ENCOUNTER — Other Ambulatory Visit: Payer: Self-pay

## 2021-04-11 ENCOUNTER — Encounter: Payer: Self-pay | Admitting: Physical Therapy

## 2021-04-11 ENCOUNTER — Ambulatory Visit: Payer: Medicare HMO | Admitting: Physical Therapy

## 2021-04-11 DIAGNOSIS — M6281 Muscle weakness (generalized): Secondary | ICD-10-CM | POA: Diagnosis not present

## 2021-04-11 DIAGNOSIS — M25562 Pain in left knee: Secondary | ICD-10-CM | POA: Diagnosis not present

## 2021-04-11 DIAGNOSIS — G8929 Other chronic pain: Secondary | ICD-10-CM | POA: Diagnosis not present

## 2021-04-11 DIAGNOSIS — R262 Difficulty in walking, not elsewhere classified: Secondary | ICD-10-CM | POA: Diagnosis not present

## 2021-04-11 NOTE — Therapy (Signed)
OUTPATIENT PHYSICAL THERAPY TREATMENT NOTE   Patient Name: Caroline [name] MRN: [id number] DOB:1950/06/17, 71 y.o., female Today's Date: 04/11/2021  PCP: Buzzy Han, MD REFERRING PROVIDER: Buzzy Han*   PT End of Session - 04/11/21 1059     Visit Number 3    Number of Visits 12    Date for PT Re-Evaluation 05/06/21    Authorization Type HUMA[employer]    PT Start Time 1100    PT Stop Time 1140    PT Time Calculation (min) 40 min    Activity Tolerance Patient tolerated treatment well    Behavior During Therapy WFL for tasks assessed/performed             Past Medical History:  Diagnosis Date   Allergy    Zyrtec, Benadryl   Anxiety    Zoloft since several years   Cataract    Hyperlipidemia    Hypertension    Migraines    topmax and Imitrex   Past Surgical History:  Procedure Laterality Date   ABDOMINAL HYSTERECTOMY     uterine prolapse; ovaries INTACT   BLADDER REPAIR     BREAST BIOPSY     BREAST EXCISIONAL BIOPSY     BREAST SURGERY     breast bx x 2 in 1980s; benign   CATARACT EXTRACTION, BILATERAL     Patient Active Problem List   Diagnosis Date Noted   Postcalcaneal bursitis of right foot 03/11/2021   Primary osteoarthritis of both knees 03/11/2021   Chronic pain of both knees 03/11/2021   Asthma due to seasonal allergies 11/17/2019   Osteopenia of multiple sites 10/19/2018   Chronic sinusitis 06/10/2018   Essential hypertension, benign 10/22/2017   Benign hematuria 06/17/2017   Pure hypercholesterolemia 08/11/2016   Anxiety and depression 08/11/2016   Chronic migraine without aura without status migrainosus, not intractable 08/11/2016    REFERRING DIAG:  L knee pain   THERAPY DIAG:  Chronic pain of left knee  Difficulty in walking, not elsewhere classified  Muscle weakness (generalized)   REFERRING PROVIDER: Gregor Hams, MD      SUBJECTIVE:    SUBJECTIVE STATEMENT: States she had left knee injected 2 days ago and had  minor soreness after but change in symptoms other wise. States that her back was killing her last night    PERTINENT HISTORY: none   PAIN:  Are you having pain? Yes VAS scale: 3/10 Pain location: knee Pain orientation: Left  PAIN TYPE: aching, soreness Pain description: intermittent  Aggravating factors: standing, transfers, stairs, kneeling Relieving factors: rest    Are you having pain? Yes VAS scale: 3/10 Pain location: knee Pain orientation: Right  PAIN TYPE: aching, soreness Pain description: intermittent  Aggravating factors: standing, walking , stairs  Relieving factors: rest    Are you having pain? Yes VAS scale: 5/10 Pain location: ankle, heel  Pain orientation: Right  PAIN TYPE: aching Pain description: intermittent  Aggravating factors: standing, walking  Relieving factors: rest     PRECAUTIONS: None   WEIGHT BEARING RESTRICTIONS No   FALLS:  Has patient fallen in last 6 months? Yes, Number of falls: 1 Slipped on wet leaves.        PLOF: Independent   PATIENT GOALS :decreased knee pain and ankle pain      OBJECTIVE: carried from eval       COGNITION:          Overall cognitive status: Within functional limits for tasks assessed  PALPATION:04/05/21 Tenderness in L knee posterior knee with swelling noted posteriorly and medially. Tenderness to left medial and lateral sides of right ankle with medial worse than lateral   LE AROM/PROM:  Hips, Knees: WFL,   Ankle: mild limitation for DF bil      LE MMT:  knees: 4/5, Hips: 4/5, Ankle: 4/5 ,       LOWER EXTREMITY SPECIAL TESTS:  Increased laxity for knee ant drawer, bilaterally           Instability in R ankle noted with SLS, and decreased heel to toe propulsion, with increased lateral sway.        TODAY'S TREATMENT: 04/11/21 Therapeutic Exercise:  Aerobic:  Side lying: hip abd at wall 2x10 Bilat, hip extension at wall 4x5 5" holds Bilat  Neuromuscular  Re-education: Manual Therapy:  STM and edema massage to left posterior knee, and STM to right ankle medial and lateral aspects   04/05/21 Therapeutic Exercise:  Aerobic: Supine: bridges 5x5 5" holds, heel slides x10 Bilat, thomas stretch x2 bilat 30" holds  Seated: LAQS x5 5" holds B   Standing: gastroc  stretch at wall x2 30" holds, heel raise with holds x5 5" holds at counter double leg, Neuromuscular Re-education: Manual Therapy:  STM and edema massage to left posterior knee, and STM to right ankle medial and lateral aspects  Therapeutic Activity: Self Care: Trigger Point Dry Needling:  Modalities:       PATIENT EDUCATION:  Education details: reviewed exercises for form and verbal and tactile cues provided throughout session. Educated on different types of injections, and anticipated rehab following a knee replacement. On importance of adherence to HEP Person educated: Patient Education method: Explanation, Demonstration, Tactile cues, Verbal cues, and Handouts Education comprehension: verbalized understanding, returned demonstration, verbal cues required, tactile cues required, and needs further education     HOME EXERCISE PROGRAM: Access Code: [id number]      ASSESSMENT:   CLINICAL IMPRESSION:  Objective impairments include Abnormal gait, decreased activity tolerance, decreased balance, decreased knowledge of use of DME, decreased mobility, difficulty walking, decreased ROM, decreased strength, increased muscle spasms, and impaired flexibility. These impairments are limiting patient from cleaning, community activity, driving, meal prep, yard work, shopping, and yard work. Personal factors including Time since onset of injury/illness/exacerbation are also affecting patient's functional outcome. Patient will benefit from skilled PT to address above impairments and improve overall function.   Reviewed HEP secondary to reports of lumbar pain recently after performing exercises.  Excessive rotation with side lying hip abd noted. Repositioned patient with back against and this was tolerated better but patient fatigued quickly and required longer rest breaks. Session focused on educatoin of form, different types of injections and on typical progression with rehab following knee replacement.        REHAB POTENTIAL: Good   CLINICAL DECISION MAKING: Stable/uncomplicated   EVALUATION COMPLEXITY: Low     GOALS: Goals reviewed with patient? Yes   SHORT TERM GOALS:   SHORT TERM GOALS:   STG Name Target Date Goal status  1 Pt to be independent with initial HEP 04/08/21 INITIAL                               LONG TERM GOALS:    LTG Name   Goal status  1 Pt to be independent with final HEP    05/20/21 INITIAL  2 Pt to report decreased pain in L knee pain to  0-2/10 with activity, walking, and stairs.    05/20/21 INITIAL  3 Pt to demo ability for standing, walking for at least 30 min without pain greater than 2/10, to improve ability for IADLS and community actiivty .  05/20/21 INITIAL  4   Pt to report decreased pain in R ankle/heel to 0-2/10 with standing and walking, and be independent with HEP for ankle pain.  05/20/21 INITIAL  5   Pt to demo improved strength of bil hips and knees to at least 4+/5 , to improve stability and ability for functional activity  05/20/21 INITIAL   6   Baseline:      7   Baseline:                PLAN: PT FREQUENCY: 1-2x/week   PT DURATION: 8 weeks   PLANNED INTERVENTIONS: Therapeutic exercises, Therapeutic activity, Neuro Muscular re-education, Balance training, Gait training, Patient/Family education, Joint mobilization, Stair training, Orthotic/Fit training, DME instructions, Dry Needling, Electrical stimulation, Spinal mobilization, Cryotherapy, Moist heat, Taping, Vasopneumatic device, Traction, Ultrasound, Ionotophoresis 4mg /ml Dexamethasone, and Manual therapy     12:59 PM, 04/11/21 Jerene Pitch, DPT Physical Therapy  with [facility]  [phone] office

## 2021-04-15 ENCOUNTER — Encounter: Payer: Self-pay | Admitting: Physical Therapy

## 2021-04-15 ENCOUNTER — Other Ambulatory Visit: Payer: Self-pay

## 2021-04-15 ENCOUNTER — Ambulatory Visit: Payer: Self-pay

## 2021-04-15 ENCOUNTER — Ambulatory Visit: Payer: Medicare HMO | Admitting: Physical Therapy

## 2021-04-15 ENCOUNTER — Ambulatory Visit (INDEPENDENT_AMBULATORY_CARE_PROVIDER_SITE_OTHER): Payer: Medicare HMO | Admitting: Family Medicine

## 2021-04-15 DIAGNOSIS — R262 Difficulty in walking, not elsewhere classified: Secondary | ICD-10-CM

## 2021-04-15 DIAGNOSIS — M1712 Unilateral primary osteoarthritis, left knee: Secondary | ICD-10-CM | POA: Diagnosis not present

## 2021-04-15 DIAGNOSIS — M6281 Muscle weakness (generalized): Secondary | ICD-10-CM | POA: Diagnosis not present

## 2021-04-15 DIAGNOSIS — M25562 Pain in left knee: Secondary | ICD-10-CM

## 2021-04-15 DIAGNOSIS — G8929 Other chronic pain: Secondary | ICD-10-CM

## 2021-04-15 DIAGNOSIS — M25571 Pain in right ankle and joints of right foot: Secondary | ICD-10-CM

## 2021-04-15 NOTE — Progress Notes (Signed)
Myiesha presents to clinic today for Orthovisc injection left knee 2/3  Procedure: Real-time Ultrasound Guided Injection of left knee superior lateral patellar space Device: Philips Affiniti 50G Images permanently stored and available for review in PACS Verbal informed consent obtained.  Discussed risks and benefits of procedure. Warned about infection bleeding damage to structures skin hypopigmentation and fat atrophy among others. Patient expresses understanding and agreement Time-out conducted.   Noted no overlying erythema, induration, or other signs of local infection.   Skin prepped in a sterile fashion.   Local anesthesia: Topical Ethyl chloride.   With sterile technique and under real time ultrasound guidance: Orthovisc 30 mg injected into knee joint. Fluid seen entering the joint capsule.   Completed without difficulty   Advised to call if fevers/chills, erythema, induration, drainage, or persistent bleeding.   Images permanently stored and available for review in the ultrasound unit.  Impression: Technically successful ultrasound guided injection.  Lot number: [id number]  Return in 1 week for Orthovisc injection left knee 3/3

## 2021-04-15 NOTE — Therapy (Signed)
OUTPATIENT PHYSICAL THERAPY TREATMENT NOTE   Patient Name: Caroline [name] MRN: [id number] DOB:12-31-1950, 71 y.o., female Today's Date: 04/15/2021  PCP: Buzzy Han, MD REFERRING PROVIDER: Buzzy Han*   PT End of Session - 04/15/21 1028     Visit Number 4    Number of Visits 12    Date for PT Re-Evaluation 05/06/21    Authorization Type HU[employer]    PT Start Time 1024    PT Stop Time 1102    PT Time Calculation (min) 38 min    Activity Tolerance Patient tolerated treatment well    Behavior During Therapy WFL for tasks assessed/performed              Past Medical History:  Diagnosis Date   Allergy    Zyrtec, Benadryl   Anxiety    Zoloft since several years   Cataract    Hyperlipidemia    Hypertension    Migraines    topmax and Imitrex   Past Surgical History:  Procedure Laterality Date   ABDOMINAL HYSTERECTOMY     uterine prolapse; ovaries INTACT   BLADDER REPAIR     BREAST BIOPSY     BREAST EXCISIONAL BIOPSY     BREAST SURGERY     breast bx x 2 in 1980s; benign   CATARACT EXTRACTION, BILATERAL     Patient Active Problem List   Diagnosis Date Noted   Postcalcaneal bursitis of right foot 03/11/2021   Primary osteoarthritis of both knees 03/11/2021   Chronic pain of both knees 03/11/2021   Asthma due to seasonal allergies 11/17/2019   Osteopenia of multiple sites 10/19/2018   Chronic sinusitis 06/10/2018   Essential hypertension, benign 10/22/2017   Benign hematuria 06/17/2017   Pure hypercholesterolemia 08/11/2016   Anxiety and depression 08/11/2016   Chronic migraine without aura without status migrainosus, not intractable 08/11/2016    REFERRING DIAG:  L knee pain   THERAPY DIAG:  Chronic pain of left knee  Difficulty in walking, not elsewhere classified  Muscle weakness (generalized)  Pain in right ankle and joints of right foot   REFERRING PROVIDER: Gregor Hams, MD      SUBJECTIVE:    SUBJECTIVE STATEMENT: Pt  states less pain in knee today, but overall still sore in knee and ankle. Having 2nd gel injection today in L.    PERTINENT HISTORY: none   PAIN:  Are you having pain? Yes VAS scale: 3/10 Pain location: knee Pain orientation: Left  PAIN TYPE: aching, soreness Pain description: intermittent  Aggravating factors: standing, transfers, stairs, kneeling Relieving factors: rest    Are you having pain? Yes VAS scale: 3/10 Pain location: knee Pain orientation: Right  PAIN TYPE: aching, soreness Pain description: intermittent  Aggravating factors: standing, walking , stairs  Relieving factors: rest    Are you having pain? Yes VAS scale: 5/10 Pain location: ankle, heel  Pain orientation: Right  PAIN TYPE: aching Pain description: intermittent  Aggravating factors: standing, walking  Relieving factors: rest     PRECAUTIONS: None   WEIGHT BEARING RESTRICTIONS No   FALLS:  Has patient fallen in last 6 months? Yes, Number of falls: 1 Slipped on wet leaves.        PLOF: Independent   PATIENT GOALS :decreased knee pain and ankle pain      OBJECTIVE: carried from eval       COGNITION:          Overall cognitive status: Within functional limits for tasks assessed  PALPATION:04/05/21 Tenderness in L knee posterior knee with swelling noted posteriorly and medially. Tenderness to left medial and lateral sides of right ankle with medial worse than lateral   LE AROM/PROM:  Hips, Knees: WFL,   Ankle: mild limitation for DF bil      LE MMT:  knees: 4/5, Hips: 4/5, Ankle: 4/5 ,       LOWER EXTREMITY SPECIAL TESTS:  Increased laxity for knee ant drawer, bilaterally           Instability in R ankle noted with SLS, and decreased heel to toe propulsion, with increased lateral sway.        TODAY'S TREATMENT:  04/15/21 Therapeutic Exercise:  Aerobic: Recumbent bike x 5 min, L1   SLR x 10 bil,  LAQ 3 lb eccentric lowering x 20 bil; Sit to stand, slow  lowering x 10 ; Step ups 4 in x 10, 6 in x 5 (pain), Heel raises x 20;  Soleus stretch 30 sec x 3 at wall   Neuromuscular Re-education: Manual Therapy: Manual: STM to right medial Achilles, soleus and posterior tib with knee straight and bend with/without passive ankle DF/PF  - tolerated    04/11/21 Therapeutic Exercise:  Aerobic:  Side lying: hip abd at wall 2x10 Bilat, hip extension at wall 4x5 5" holds Bilat  Neuromuscular Re-education: Manual Therapy:  STM and edema massage to left posterior knee, and STM to right ankle medial and lateral aspects   04/05/21 Therapeutic Exercise:  Aerobic: Supine: bridges 5x5 5" holds, heel slides x10 Bilat, thomas stretch x2 bilat 30" holds  Seated: LAQS x5 5" holds B   Standing: gastroc  stretch at wall x2 30" holds, heel raise with holds x5 5" holds at counter double leg, Neuromuscular Re-education: Manual Therapy:  STM and edema massage to left posterior knee, and STM to right ankle medial and lateral aspects  Therapeutic Activity: Self Care: Trigger Point Dry Needling:  Modalities:       PATIENT EDUCATION:  Education details: reviewed exercises for form and verbal and tactile cues provided throughout session. Educated on different types of injections, and anticipated rehab following a knee replacement. On importance of adherence to HEP Person educated: Patient Education method: Explanation, Demonstration, Tactile cues, Verbal cues, and Handouts Education comprehension: verbalized understanding, returned demonstration, verbal cues required, tactile cues required, and needs further education     HOME EXERCISE PROGRAM: Access Code: [id number]      ASSESSMENT:   CLINICAL IMPRESSION:  Objective impairments include Abnormal gait, decreased activity tolerance, decreased balance, decreased knowledge of use of DME, decreased mobility, difficulty walking, decreased ROM, decreased strength, increased muscle spasms, and impaired flexibility.  These impairments are limiting patient from cleaning, community activity, driving, meal prep, yard work, shopping, and yard work. Personal factors including Time since onset of injury/illness/exacerbation are also affecting patient's functional outcome. Patient will benefit from skilled PT to address above impairments and improve overall function.   04/15/21: Assessment: Session continued to focus on LE strengthening and stability exercises. Cues for form throughout session. Discussed and educated patient on importance of stability exercises in regards to knee pain. Notable tenderness and pain along medial lower leg musculature referring some pain to medial knee. Tolerated manual well and added soleus stretch to HEP. During soleus stretch, patient reported pain in her ankle, instructed patient to only go as far as tolerated with stretch.  Will follow up with ankle and knee symptoms next session and progress soleus strengthening as indicated.  REHAB POTENTIAL: Good   CLINICAL DECISION MAKING: Stable/uncomplicated   EVALUATION COMPLEXITY: Low     GOALS: Goals reviewed with patient? Yes   SHORT TERM GOALS:   SHORT TERM GOALS:   STG Name Target Date Goal status  1 Pt to be independent with initial HEP 04/08/21 INITIAL                               LONG TERM GOALS:    LTG Name   Goal status  1 Pt to be independent with final HEP    05/20/21 INITIAL  2 Pt to report decreased pain in L knee pain to 0-2/10 with activity, walking, and stairs.    05/20/21 INITIAL  3 Pt to demo ability for standing, walking for at least 30 min without pain greater than 2/10, to improve ability for IADLS and community actiivty .  05/20/21 INITIAL  4   Pt to report decreased pain in R ankle/heel to 0-2/10 with standing and walking, and be independent with HEP for ankle pain.  05/20/21 INITIAL  5   Pt to demo improved strength of bil hips and knees to at least 4+/5 , to improve stability and ability for  functional activity  05/20/21 INITIAL   6   Baseline:      7   Baseline:                PLAN: PT FREQUENCY: 1-2x/week   PT DURATION: 8 weeks   PLANNED INTERVENTIONS: Therapeutic exercises, Therapeutic activity, Neuro Muscular re-education, Balance training, Gait training, Patient/Family education, Joint mobilization, Stair training, Orthotic/Fit training, DME instructions, Dry Needling, Electrical stimulation, Spinal mobilization, Cryotherapy, Moist heat, Taping, Vasopneumatic device, Traction, Ultrasound, Ionotophoresis 4mg /ml Dexamethasone, and Manual therapy    Lyndee Hensen, PT, DPT 12:14 PM  04/15/21

## 2021-04-15 NOTE — Patient Instructions (Signed)
Good to see you today.  You had a L knee Orthovisc injection (2/3).  Call or go to the ER if you develop a large red swollen joint with extreme pain or oozing puss.   Follow-up next week for the 3rd injection in the series.

## 2021-04-19 ENCOUNTER — Encounter: Payer: Medicare HMO | Admitting: Physical Therapy

## 2021-04-22 ENCOUNTER — Ambulatory Visit: Payer: Medicare HMO | Admitting: Family Medicine

## 2021-04-23 ENCOUNTER — Other Ambulatory Visit: Payer: Self-pay

## 2021-04-23 ENCOUNTER — Ambulatory Visit (INDEPENDENT_AMBULATORY_CARE_PROVIDER_SITE_OTHER): Payer: Medicare HMO | Admitting: Family Medicine

## 2021-04-23 ENCOUNTER — Ambulatory Visit: Payer: Self-pay

## 2021-04-23 DIAGNOSIS — G8929 Other chronic pain: Secondary | ICD-10-CM

## 2021-04-23 DIAGNOSIS — M1712 Unilateral primary osteoarthritis, left knee: Secondary | ICD-10-CM

## 2021-04-23 DIAGNOSIS — M25562 Pain in left knee: Secondary | ICD-10-CM | POA: Diagnosis not present

## 2021-04-23 NOTE — Patient Instructions (Addendum)
Good to see you today. ? ?You had your final L knee Orthovisc injection.  Call or go to the ER if you develop a large red swollen joint with extreme pain or oozing puss.  ? ?Follow-up as needed. ?

## 2021-04-23 NOTE — Progress Notes (Signed)
Yarimar presents to clinic today for Orthovisc injection left knee 3/3  Procedure: Real-time Ultrasound Guided Injection of left knee superior lateral patellar space Device: Philips Affiniti 50G Images permanently stored and available for review in PACS Verbal informed consent obtained.  Discussed risks and benefits of procedure. Warned about infection bleeding damage to structures skin hypopigmentation and fat atrophy among others. Patient expresses understanding and agreement Time-out conducted.   Noted no overlying erythema, induration, or other signs of local infection.   Skin prepped in a sterile fashion.   Local anesthesia: Topical Ethyl chloride.   With sterile technique and under real time ultrasound guidance: Orthovisc 30 mg injected into knee joint. Fluid seen entering the joint capsule.   Completed without difficulty   Advised to call if fevers/chills, erythema, induration, drainage, or persistent bleeding.   Images permanently stored and available for review in the ultrasound unit.  Impression: Technically successful ultrasound guided injection.    Lot number: [id number]  Return as needed

## 2021-04-25 ENCOUNTER — Other Ambulatory Visit: Payer: Self-pay

## 2021-04-25 ENCOUNTER — Ambulatory Visit (INDEPENDENT_AMBULATORY_CARE_PROVIDER_SITE_OTHER): Payer: Medicare HMO | Admitting: Physical Therapy

## 2021-04-25 ENCOUNTER — Encounter: Payer: Self-pay | Admitting: Physical Therapy

## 2021-04-25 DIAGNOSIS — R262 Difficulty in walking, not elsewhere classified: Secondary | ICD-10-CM | POA: Diagnosis not present

## 2021-04-25 DIAGNOSIS — M25571 Pain in right ankle and joints of right foot: Secondary | ICD-10-CM

## 2021-04-25 DIAGNOSIS — M6281 Muscle weakness (generalized): Secondary | ICD-10-CM

## 2021-04-25 DIAGNOSIS — M25562 Pain in left knee: Secondary | ICD-10-CM | POA: Diagnosis not present

## 2021-04-25 DIAGNOSIS — G8929 Other chronic pain: Secondary | ICD-10-CM

## 2021-04-25 NOTE — Therapy (Signed)
OUTPATIENT PHYSICAL THERAPY TREATMENT NOTE   Patient Name: Caroline [name] MRN: [id number] DOB:1950-10-13, 71 y.o., female Today's Date: 04/25/2021  PCP: Buzzy Han, MD REFERRING PROVIDER: Buzzy Han*   PT End of Session - 04/25/21 1221     Visit Number 5    Number of Visits 12    Date for PT Re-Evaluation 05/06/21    Authorization Type [employer]    PT Start Time 1217    PT Stop Time 1300    PT Time Calculation (min) 43 min    Activity Tolerance Patient tolerated treatment well    Behavior During Therapy WFL for tasks assessed/performed               Past Medical History:  Diagnosis Date   Allergy    Zyrtec, Benadryl   Anxiety    Zoloft since several years   Cataract    Hyperlipidemia    Hypertension    Migraines    topmax and Imitrex   Past Surgical History:  Procedure Laterality Date   ABDOMINAL HYSTERECTOMY     uterine prolapse; ovaries INTACT   BLADDER REPAIR     BREAST BIOPSY     BREAST EXCISIONAL BIOPSY     BREAST SURGERY     breast bx x 2 in 1980s; benign   CATARACT EXTRACTION, BILATERAL     Patient Active Problem List   Diagnosis Date Noted   Postcalcaneal bursitis of right foot 03/11/2021   Primary osteoarthritis of both knees 03/11/2021   Chronic pain of both knees 03/11/2021   Asthma due to seasonal allergies 11/17/2019   Osteopenia of multiple sites 10/19/2018   Chronic sinusitis 06/10/2018   Essential hypertension, benign 10/22/2017   Benign hematuria 06/17/2017   Pure hypercholesterolemia 08/11/2016   Anxiety and depression 08/11/2016   Chronic migraine without aura without status migrainosus, not intractable 08/11/2016    REFERRING DIAG:  L knee pain   THERAPY DIAG:  Chronic pain of left knee  Difficulty in walking, not elsewhere classified  Muscle weakness (generalized)  Pain in right ankle and joints of right foot   REFERRING PROVIDER: Gregor Hams, MD      SUBJECTIVE:    SUBJECTIVE  STATEMENT: Pt has completed 3 knee injections. She still has pain in anterior knee, joint line. R ankle also still quite sore.    PERTINENT HISTORY: none   PAIN:  Are you having pain? Yes VAS scale: 3/10 Pain location: knee Pain orientation: Left  PAIN TYPE: aching, soreness Pain description: intermittent  Aggravating factors: standing, transfers, stairs, kneeling Relieving factors: rest    Are you having pain? Yes VAS scale: 3/10 Pain location: knee Pain orientation: Right  PAIN TYPE: aching, soreness Pain description: intermittent  Aggravating factors: standing, walking , stairs  Relieving factors: rest    Are you having pain? Yes VAS scale: 5/10 Pain location: ankle, heel  Pain orientation: Right  PAIN TYPE: aching Pain description: intermittent  Aggravating factors: standing, walking  Relieving factors: rest     PRECAUTIONS: None   WEIGHT BEARING RESTRICTIONS No   FALLS:  Has patient fallen in last 6 months? Yes, Number of falls: 1 Slipped on wet leaves.        PLOF: Independent   PATIENT GOALS :decreased knee pain and ankle pain      OBJECTIVE: carried from eval       COGNITION:          Overall cognitive status: Within functional limits for tasks assessed  LE AROM/PROM:  Hips, Knees: WFL,   Ankle: mild limitation for DF bil      LE MMT:  knees: 4/5, Hips: 4/5, Ankle: 4/5 ,            TODAY'S TREATMENT:  04/25/21:  Therapeutic Exercise:  Aerobic:  Recumbent bike x 6  min, L1   Standing: Step ups 6 in x 10,  up/down steps with education on optimal mechanics for knee pain.  Heel raises x 20; SLS 10 sec x 5 bil; Tandem stance with head turns x 10 ea bil;  Supine: SLR 2 x 10 bil,   Stretches: Soleus stretch 30 sec x 3 at wall ; Gastroc stretch at step 20 sec x 5;   Neuromuscular Re-education: Manual Therapy: Manual: STM to right medial Achilles, soleus and posterior tib with knee straight and bend with/without  passive ankle DF/PF  - tolerated  Modalities: 4 hr Ionto patch/Dexamethasone to R mid/distal achilles.      04/15/21 Therapeutic Exercise:  Aerobic: Recumbent bike x 5 min, L1   SLR x 10 bil,  LAQ 3 lb eccentric lowering x 20 bil; Sit to stand, slow lowering x 10 ; Step ups 4 in x 10, 6 in x 5 (pain), Heel raises x 20;  Soleus stretch 30 sec x 3 at wall   Neuromuscular Re-education: Manual Therapy: Manual: STM to right medial Achilles, soleus and posterior tib with knee straight and bend with/without passive ankle DF/PF  - tolerated    04/11/21 Therapeutic Exercise:  Aerobic:  Side lying: hip abd at wall 2x10 Bilat, hip extension at wall 4x5 5" holds Bilat  Neuromuscular Re-education: Manual Therapy:  STM and edema massage to left posterior knee, and STM to right ankle medial and lateral aspects      PATIENT EDUCATION:  Education details: updated and reviewed HEP Person educated: Patient Education method: Explanation, Demonstration, Tactile cues, Verbal cues, and Handouts Education comprehension: verbalized understanding, returned demonstration, verbal cues required, tactile cues required, and needs further education     HOME EXERCISE PROGRAM: Access Code: [email or web address]      ASSESSMENT:   CLINICAL IMPRESSION:  04/25/21:Pt with improving strength in knee and hip. She continues to have soreness in knee with increased activity, but is able to perform activities today without pain. Education and practice for stair climbing with least amount of pressure on knee. Pt with continued pain in R mid/distal achilles. Continued manual, and ther ex for pain/strengthening. Educated on importance of continued HEP for this. Noted instability in R foot/ankle vs L with SLS today. Also added Ionto for pain. Pt to benefit from continued care.     Objective impairments include Abnormal gait, decreased activity tolerance, decreased balance, decreased knowledge of use of DME, decreased mobility,  difficulty walking, decreased ROM, decreased strength, increased muscle spasms, and impaired flexibility. These impairments are limiting patient from cleaning, community activity, driving, meal prep, yard work, shopping, and yard work. Personal factors including Time since onset of injury/illness/exacerbation are also affecting patient's functional outcome. Patient will benefit from skilled PT to address above impairments and improve overall function.      REHAB POTENTIAL: Good   CLINICAL DECISION MAKING: Stable/uncomplicated   EVALUATION COMPLEXITY: Low     GOALS: Goals reviewed with patient? Yes   SHORT TERM GOALS:   SHORT TERM GOALS:   STG Name Target Date Goal status  1 Pt to be independent with initial HEP 04/08/21 MET  LONG TERM GOALS:    LTG Name   Goal status  1 Pt to be independent with final HEP    05/20/21 INITIAL  2 Pt to report decreased pain in L knee pain to 0-2/10 with activity, walking, and stairs.    05/20/21 INITIAL  3 Pt to demo ability for standing, walking for at least 30 min without pain greater than 2/10, to improve ability for IADLS and community actiivty .  05/20/21 INITIAL  4   Pt to report decreased pain in R ankle/heel to 0-2/10 with standing and walking, and be independent with HEP for ankle pain.  05/20/21 INITIAL  5   Pt to demo improved strength of bil hips and knees to at least 4+/5 , to improve stability and ability for functional activity  05/20/21 INITIAL   6   Baseline:      7   Baseline:                PLAN: PT FREQUENCY: 1-2x/week   PT DURATION: 8 weeks   PLANNED INTERVENTIONS: Therapeutic exercises, Therapeutic activity, Neuro Muscular re-education, Balance training, Gait training, Patient/Family education, Joint mobilization, Stair training, Orthotic/Fit training, DME instructions, Dry Needling, Electrical stimulation, Spinal mobilization, Cryotherapy, Moist heat, Taping, Vasopneumatic device, Traction,  Ultrasound, Ionotophoresis 81m/ml Dexamethasone, and Manual therapy    LLyndee Hensen PT, DPT 1:46 PM  04/25/21

## 2021-05-02 ENCOUNTER — Encounter: Payer: Medicare HMO | Admitting: Physical Therapy

## 2021-05-09 ENCOUNTER — Other Ambulatory Visit: Payer: Self-pay

## 2021-05-09 ENCOUNTER — Encounter: Payer: Self-pay | Admitting: Physical Therapy

## 2021-05-09 ENCOUNTER — Ambulatory Visit (INDEPENDENT_AMBULATORY_CARE_PROVIDER_SITE_OTHER): Payer: Medicare HMO | Admitting: Physical Therapy

## 2021-05-09 DIAGNOSIS — R262 Difficulty in walking, not elsewhere classified: Secondary | ICD-10-CM | POA: Diagnosis not present

## 2021-05-09 DIAGNOSIS — M6281 Muscle weakness (generalized): Secondary | ICD-10-CM

## 2021-05-09 DIAGNOSIS — G8929 Other chronic pain: Secondary | ICD-10-CM

## 2021-05-09 DIAGNOSIS — M25562 Pain in left knee: Secondary | ICD-10-CM

## 2021-05-09 DIAGNOSIS — M25571 Pain in right ankle and joints of right foot: Secondary | ICD-10-CM | POA: Diagnosis not present

## 2021-05-09 NOTE — Therapy (Signed)
OUTPATIENT PHYSICAL THERAPY TREATMENT NOTE/ RE- CERT    Patient Name: Caroline [name] MRN: [id number] DOB:[birth year], 71 y.o., female Today's Date: 05/09/2021  PCP: Buzzy Han, MD REFERRING PROVIDER: Buzzy Han*   PT End of Session - 05/09/21 1534     Visit Number 6    Number of Visits 12    Date for PT Re-Evaluation 06/20/21    Authorization Type [employer]    PT Start Time 1217    PT Stop Time 1302    PT Time Calculation (min) 45 min    Activity Tolerance Patient tolerated treatment well    Behavior During Therapy WFL for tasks assessed/performed                Past Medical History:  Diagnosis Date   Allergy    Zyrtec, Benadryl   Anxiety    Zoloft since several years   Cataract    Hyperlipidemia    Hypertension    Migraines    topmax and Imitrex   Past Surgical History:  Procedure Laterality Date   ABDOMINAL HYSTERECTOMY     uterine prolapse; ovaries INTACT   BLADDER REPAIR     BREAST BIOPSY     BREAST EXCISIONAL BIOPSY     BREAST SURGERY     breast bx x 2 in 1980s; benign   CATARACT EXTRACTION, BILATERAL     Patient Active Problem List   Diagnosis Date Noted   Postcalcaneal bursitis of right foot 03/11/2021   Primary osteoarthritis of both knees 03/11/2021   Chronic pain of both knees 03/11/2021   Asthma due to seasonal allergies 11/17/2019   Osteopenia of multiple sites 10/19/2018   Chronic sinusitis 06/10/2018   Essential hypertension, benign 10/22/2017   Benign hematuria 06/17/2017   Pure hypercholesterolemia 08/11/2016   Anxiety and depression 08/11/2016   Chronic migraine without aura without status migrainosus, not intractable 08/11/2016    REFERRING DIAG:  L knee pain   THERAPY DIAG:  Chronic pain of left knee  Difficulty in walking, not elsewhere classified  Muscle weakness (generalized)  Pain in right ankle and joints of right foot   REFERRING PROVIDER: Gregor Hams, MD      SUBJECTIVE:    SUBJECTIVE  STATEMENT: Pt states mild/ongoing pain in L knee. Most pain laterally. R ankle continues to be very painful. She has been seen for 6 visits.     PERTINENT HISTORY: none   PAIN:  Are you having pain? Yes VAS scale: up to 6/10 Pain location: knee Pain orientation: Left  PAIN TYPE: aching, soreness Pain description: intermittent  Aggravating factors: standing, stairs,  Relieving factors: rest   Are you having pain? Yes VAS scale: 2/10 Pain location: knee Pain orientation: Right  PAIN TYPE: aching, soreness Pain description: intermittent  Aggravating factors: stairs  Relieving factors: rest    Are you having pain? Yes VAS scale: 6/10 Pain location: ankle, heel  Pain orientation: Right  PAIN TYPE: aching Pain description: intermittent  Aggravating factors: standing, walking  Relieving factors: rest     PRECAUTIONS: None   WEIGHT BEARING RESTRICTIONS No   FALLS:  Has patient fallen in last 6 months? Yes, Number of falls: 1 Slipped on wet leaves.      PLOF: Independent   PATIENT GOALS :decreased knee pain and ankle pain      OBJECTIVE:     COGNITION:          Overall cognitive status: Within functional limits for tasks assessed  LE AROM/PROM:  Hips, Knees: WFL,   Ankle: mild limitation for DF bil      LE MMT:  knees: 4+/5, Hips: 4/+5, Ankle: 4/5   Palpation: painful R mid/distal achilles with palpation.  Painful lateral quad and distal ITB at L knee.           TODAY'S TREATMENT:  07/06/64: ReCert  Therapeutic Exercise:  Aerobic:    Standing: Step ups 6 in x 10,   Heel raises x 25;   Seated: sit to stand x 10;  Supine: SLR 2 x 10 bil,   Stretches:  Neuromuscular Re-education: Manual Therapy: Manual:  STM/DTM to right Achilles and medial gastroc;  K-tape for R achilles decompression; DTM to lateral quad and distal ITB on L;  Self Care: Pt given heel lift in R shoe for achilles tendon offloading. (R LE appears shorter in  standing, so it was only given unilaterally. Gave pt 2nd lift to put in L shoe, if her back is uncomfortable. )  Trigger Point Dry-Needling  Treatment instructions: Expect mild to moderate muscle soreness. S/S of pneumothorax if dry needled over a lung field, and to seek immediate medical attention should they occur. Patient verbalized understanding of these instructions and education.  Patient Consent Given: Yes Education handout provided: Yes Muscles treated: L vastus lateralis Electrical stimulation performed: No Parameters: N/A Treatment response/outcome: Twitch response elicited and Palpable decrease in muscle tension     04/25/21:  Therapeutic Exercise:  Aerobic:  Recumbent bike x 6  min, L1   Standing: Step ups 6 in x 10,  up/down steps with education on optimal mechanics for knee pain.  Heel raises x 20; SLS 10 sec x 5 bil; Tandem stance with head turns x 10 ea bil;  Supine: SLR 2 x 10 bil,   Stretches: Soleus stretch 30 sec x 3 at wall ; Gastroc stretch at step 20 sec x 5;   Neuromuscular Re-education: Manual Therapy: Manual: STM to right medial Achilles, soleus and posterior tib with knee straight and bend with/without passive ankle DF/PF  - tolerated  Modalities: 4 hr Ionto patch/Dexamethasone to R mid/distal achilles.      04/15/21 Therapeutic Exercise:  Aerobic: Recumbent bike x 5 min, L1   SLR x 10 bil,  LAQ 3 lb eccentric lowering x 20 bil; Sit to stand, slow lowering x 10 ; Step ups 4 in x 10, 6 in x 5 (pain), Heel raises x 20;  Soleus stretch 30 sec x 3 at wall   Neuromuscular Re-education: Manual Therapy: Manual: STM to right medial Achilles, soleus and posterior tib with knee straight and bend with/without passive ankle DF/PF  - tolerated        PATIENT EDUCATION:  Education details: updated and reviewed HEP Person educated: Patient Education method: Explanation, Demonstration, Tactile cues, Verbal cues, and Handouts Education comprehension: verbalized  understanding, returned demonstration, verbal cues required, tactile cues required, and needs further education     HOME EXERCISE PROGRAM: Access Code: [email or web address]      ASSESSMENT:   CLINICAL IMPRESSION:  0/9/32: Re-Cert Pt with ongoing pain in knee and ankle. She has been seen for 6 visits. Knee is doing somewhat better, but is tender in lateral musculature today. She is doing well with strengthening and stairs. She is doing better overall with ability for functional activity, but still reports up to 6/10 pain at times. R achilles pain is still quite painful, and limiting for sustaining standing and walking activity. Trials today for decreasing pain,  including k-tape, eliminating stretching and continuing Eccentric heel raises, as well as adding heel lift for achilles offloading. Pt with postural asymmetry, with R LE appearing shorter, so she was given heel lift only on R side, will continue to assess for need of 2 heel lifts vs 1. Pt continues to have difficulty with functional activity due to pain, and will benefit form continued care. Focus will be more on R achilles pain, will continue strength and management of L knee as well.      Objective impairments include Abnormal gait, decreased activity tolerance, decreased balance, decreased knowledge of use of DME, decreased mobility, difficulty walking, decreased ROM, decreased strength, increased muscle spasms, and impaired flexibility. These impairments are limiting patient from cleaning, community activity, driving, meal prep, yard work, shopping, and yard work. Personal factors including Time since onset of injury/illness/exacerbation are also affecting patient's functional outcome. Patient will benefit from skilled PT to address above impairments and improve overall function.      REHAB POTENTIAL: Good   CLINICAL DECISION MAKING: Stable/uncomplicated   EVALUATION COMPLEXITY: Low     GOALS: Goals reviewed with patient? Yes   SHORT TERM  GOALS:   SHORT TERM GOALS:   STG Name Target Date Goal status  1 Pt to be independent with initial HEP 04/08/21 MET                                LONG TERM GOALS:    LTG Name   Goal status  1 Pt to be independent with final HEP    06/20/21 Partially met   2 Pt to report decreased pain in L knee pain to 0-2/10 with activity, walking, and stairs.    06/20/21 Ongoing   3 Pt to demo ability for standing, walking for at least 30 min without pain greater than 2/10, to improve ability for IADLS and community actiivty .  06/20/21 Partially met  4   Pt to report decreased pain in R ankle/heel to 0-2/10 with standing and walking, and be independent with HEP for ankle pain.  06/20/21 Ongoing   5   Pt to demo improved strength of bil hips and knees to at least 4+/5 , to improve stability and ability for functional activity  05/20/21 Partially met   6   Baseline:      7   Baseline:                PLAN: PT FREQUENCY: 1-2x/week   PT DURATION: 6 weeks   PLANNED INTERVENTIONS: Therapeutic exercises, Therapeutic activity, Neuro Muscular re-education, Balance training, Gait training, Patient/Family education, Joint mobilization, Stair training, Orthotic/Fit training, DME instructions, Dry Needling, Electrical stimulation, Spinal mobilization, Cryotherapy, Moist heat, Taping, Vasopneumatic device, Traction, Ultrasound, Ionotophoresis 27m/ml Dexamethasone, and Manual therapy    LLyndee Hensen PT, DPT 3:36 PM  05/09/21

## 2021-05-16 ENCOUNTER — Ambulatory Visit: Payer: Medicare HMO | Admitting: Physical Therapy

## 2021-05-16 ENCOUNTER — Other Ambulatory Visit: Payer: Self-pay

## 2021-05-16 DIAGNOSIS — M6281 Muscle weakness (generalized): Secondary | ICD-10-CM | POA: Diagnosis not present

## 2021-05-16 DIAGNOSIS — R262 Difficulty in walking, not elsewhere classified: Secondary | ICD-10-CM | POA: Diagnosis not present

## 2021-05-16 DIAGNOSIS — M25562 Pain in left knee: Secondary | ICD-10-CM

## 2021-05-16 DIAGNOSIS — G8929 Other chronic pain: Secondary | ICD-10-CM

## 2021-05-16 DIAGNOSIS — M25571 Pain in right ankle and joints of right foot: Secondary | ICD-10-CM | POA: Diagnosis not present

## 2021-05-16 NOTE — Therapy (Signed)
OUTPATIENT PHYSICAL THERAPY TREATMENT NOTE   Patient Name: Caroline [name] MRN: [id number] DOB:10-15-1950, 71 y.o., female Today's Date: 05/16/2021  PCP: Buzzy Han, MD REFERRING PROVIDER: Buzzy Han*   PT End of Session - 05/17/21 1020     Visit Number 7    Number of Visits 12    Date for PT Re-Evaluation 06/20/21    Authorization Type [employer]    Authorization Time Period re-cert done visit 6    PT Start Time 1217    PT Stop Time 1300    PT Time Calculation (min) 43 min    Activity Tolerance Patient tolerated treatment well    Behavior During Therapy WFL for tasks assessed/performed                 Past Medical History:  Diagnosis Date   Allergy    Zyrtec, Benadryl   Anxiety    Zoloft since several years   Cataract    Hyperlipidemia    Hypertension    Migraines    topmax and Imitrex   Past Surgical History:  Procedure Laterality Date   ABDOMINAL HYSTERECTOMY     uterine prolapse; ovaries INTACT   BLADDER REPAIR     BREAST BIOPSY     BREAST EXCISIONAL BIOPSY     BREAST SURGERY     breast bx x 2 in 1980s; benign   CATARACT EXTRACTION, BILATERAL     Patient Active Problem List   Diagnosis Date Noted   Postcalcaneal bursitis of right foot 03/11/2021   Primary osteoarthritis of both knees 03/11/2021   Chronic pain of both knees 03/11/2021   Asthma due to seasonal allergies 11/17/2019   Osteopenia of multiple sites 10/19/2018   Chronic sinusitis 06/10/2018   Essential hypertension, benign 10/22/2017   Benign hematuria 06/17/2017   Pure hypercholesterolemia 08/11/2016   Anxiety and depression 08/11/2016   Chronic migraine without aura without status migrainosus, not intractable 08/11/2016    REFERRING DIAG:  L knee pain   THERAPY DIAG:  Chronic pain of left knee  Difficulty in walking, not elsewhere classified  Muscle weakness (generalized)  Pain in right ankle and joints of right foot   REFERRING PROVIDER: Gregor Hams, MD    SUBJECTIVE:    SUBJECTIVE STATEMENT: Pt states some improvement in achilles pain, has been wearing heel lift. L knee not too painful today, pain/tightness in lateral thigh improved from last visit.    PERTINENT HISTORY: none   PAIN:  Are you having pain? Yes VAS scale: up to 4/10 Pain location: knee Pain orientation: Left  PAIN TYPE: aching, soreness Pain description: intermittent  Aggravating factors: standing, stairs,  Relieving factors: rest   Are you having pain? Yes VAS scale: 2/10 Pain location: knee Pain orientation: Right  PAIN TYPE: aching, soreness Pain description: intermittent  Aggravating factors: stairs  Relieving factors: rest    Are you having pain? Yes VAS scale: 5/10 Pain location: ankle, heel  Pain orientation: Right  PAIN TYPE: aching Pain description: intermittent  Aggravating factors: standing, walking  Relieving factors: rest     PRECAUTIONS: None   WEIGHT BEARING RESTRICTIONS No   FALLS:  Has patient fallen in last 6 months? Yes, Number of falls: 1 Slipped on wet leaves.      PLOF: Independent   PATIENT GOALS :decreased knee pain and ankle pain      OBJECTIVE:     COGNITION:          Overall cognitive status: Within functional limits for  tasks assessed                          LE AROM/PROM:  Hips, Knees: WFL,   Ankle: mild limitation for DF bil      LE MMT:  knees: 4+/5, Hips: 4/+5, Ankle: 4/5   Palpation: painful R mid/distal achilles with palpation.  Painful lateral quad and distal ITB at L knee.           TODAY'S TREATMENT:  05/16/21:  Therapeutic Exercise: Aerobic:  Recumbent bike L2 x 7 min;  Standing: Step ups 6 in x 10, Step downs 4 in x 10 bil;  Heel raises x 25 no shoes; SLS 20 sec x 3 bil;  Arch lifts/C x 10 on R;  Seated:  LAQ 5 lb slow eccentric lowering  Supine: SLR 2 x 10 bil,   Stretches:  Neuromuscular Re-education: Manual Therapy:  K-tape for R achilles decompression;  Self Care: Pt  given arch pad/support R shoe for improving arch height and pronation.      0/9/98: ReCert  Therapeutic Exercise:  Aerobic:    Standing: Step ups 6 in x 10,   Heel raises x 25;   Seated: sit to stand x 10;  Supine: SLR 2 x 10 bil,   Stretches:  Neuromuscular Re-education: Manual Therapy: Manual:  STM/DTM to right Achilles and medial gastroc;  K-tape for R achilles decompression; DTM to lateral quad and distal ITB on L;  Self Care: Pt given heel lift in R shoe for achilles tendon offloading. (R LE appears shorter in standing, so it was only given unilaterally. Gave pt 2nd lift to put in L shoe, if her back is uncomfortable. )  Trigger Point Dry-Needling  Treatment instructions: Expect mild to moderate muscle soreness. S/S of pneumothorax if dry needled over a lung field, and to seek immediate medical attention should they occur. Patient verbalized understanding of these instructions and education.  Patient Consent Given: Yes Education handout provided: Yes Muscles treated: L vastus lateralis Electrical stimulation performed: No Parameters: N/A Treatment response/outcome: Twitch response elicited and Palpable decrease in muscle tension   04/25/21:  Therapeutic Exercise:  Aerobic:  Recumbent bike x 6  min, L1   Standing: Step ups 6 in x 10,  up/down steps with education on optimal mechanics for knee pain.  Heel raises x 20; SLS 10 sec x 5 bil; Tandem stance with head turns x 10 ea bil;  Supine: SLR 2 x 10 bil,   Stretches: Soleus stretch 30 sec x 3 at wall ; Gastroc stretch at step 20 sec x 5;   Neuromuscular Re-education: Manual Therapy: Manual: STM to right medial Achilles, soleus and posterior tib with knee straight and bend with/without passive ankle DF/PF  - tolerated  Modalities: 4 hr Ionto patch/Dexamethasone to R mid/distal achilles.        PATIENT EDUCATION:  Education details: updated and reviewed HEP Person educated: Patient Education method: Explanation,  Demonstration, Tactile cues, Verbal cues, and Handouts Education comprehension: verbalized understanding, returned demonstration, verbal cues required, tactile cues required, and needs further education     HOME EXERCISE PROGRAM: Access Code: [email or web address]      ASSESSMENT:   CLINICAL IMPRESSION:  05/16/21:     Pt with some improvement of R achilles pain with the addition of heel lift. Discussed optimal footwear for support today. Added arch support to R shoe, for improved foot alignment and pain. She has increased pronation on R vs L,  and increased instability with SLS on R, both likely contributing to achilles pain. L knee pain improved from last visit. She will benefit from continued care for strengthening and pain relief.     Objective impairments include Abnormal gait, decreased activity tolerance, decreased balance, decreased knowledge of use of DME, decreased mobility, difficulty walking, decreased ROM, decreased strength, increased muscle spasms, and impaired flexibility. These impairments are limiting patient from cleaning, community activity, driving, meal prep, yard work, shopping, and yard work. Personal factors including Time since onset of injury/illness/exacerbation are also affecting patient's functional outcome. Patient will benefit from skilled PT to address above impairments and improve overall function.      REHAB POTENTIAL: Good   CLINICAL DECISION MAKING: Stable/uncomplicated   EVALUATION COMPLEXITY: Low     GOALS: Goals reviewed with patient? Yes   SHORT TERM GOALS:   SHORT TERM GOALS:   STG Name Target Date Goal status  1 Pt to be independent with initial HEP 04/08/21 MET                                LONG TERM GOALS:    LTG Name   Goal status  1 Pt to be independent with final HEP    06/20/21 Partially met   2 Pt to report decreased pain in L knee pain to 0-2/10 with activity, walking, and stairs.    06/20/21 Ongoing   3 Pt to demo ability for standing,  walking for at least 30 min without pain greater than 2/10, to improve ability for IADLS and community actiivty .  06/20/21 Partially met  4   Pt to report decreased pain in R ankle/heel to 0-2/10 with standing and walking, and be independent with HEP for ankle pain.  06/20/21 Ongoing   5   Pt to demo improved strength of bil hips and knees to at least 4+/5 , to improve stability and ability for functional activity  05/20/21 Partially met   6   Baseline:      7   Baseline:                PLAN: PT FREQUENCY: 1-2x/week   PT DURATION: 6 weeks   PLANNED INTERVENTIONS: Therapeutic exercises, Therapeutic activity, Neuro Muscular re-education, Balance training, Gait training, Patient/Family education, Joint mobilization, Stair training, Orthotic/Fit training, DME instructions, Dry Needling, Electrical stimulation, Spinal mobilization, Cryotherapy, Moist heat, Taping, Vasopneumatic device, Traction, Ultrasound, Ionotophoresis 4mg /ml Dexamethasone, and Manual therapy    Lyndee Hensen, PT, DPT 10:27 AM  05/17/21

## 2021-05-17 ENCOUNTER — Encounter: Payer: Self-pay | Admitting: Physical Therapy

## 2021-07-09 ENCOUNTER — Ambulatory Visit: Payer: Medicare HMO | Admitting: Family Medicine

## 2021-07-09 ENCOUNTER — Ambulatory Visit: Payer: Self-pay

## 2021-07-09 ENCOUNTER — Encounter: Payer: Self-pay | Admitting: Family Medicine

## 2021-07-09 VITALS — BP 112/72 | HR 70 | Ht 62.0 in | Wt 149.6 lb

## 2021-07-09 DIAGNOSIS — M25571 Pain in right ankle and joints of right foot: Secondary | ICD-10-CM | POA: Diagnosis not present

## 2021-07-09 DIAGNOSIS — M1712 Unilateral primary osteoarthritis, left knee: Secondary | ICD-10-CM | POA: Diagnosis not present

## 2021-07-09 DIAGNOSIS — G8929 Other chronic pain: Secondary | ICD-10-CM

## 2021-07-09 DIAGNOSIS — M7751 Other enthesopathy of right foot: Secondary | ICD-10-CM | POA: Diagnosis not present

## 2021-07-09 NOTE — Progress Notes (Signed)
? ?I, Christoper Fabian, LAT, ATC, am serving as scribe for Dr. Clementeen Graham. ? ?Caroline [name] is a 71 y.o. female who presents to [facility] at [facility] today for f/u of R ankle/Achille's pain thought to be due to retrocalcaneal bursitis.  She was last seen by Dr. Denyse Amass on 04/23/21 for her 3rd L knee Orthovisc injection.  Prior to that she was seen on 03/11/21 w/ c/o B knee and R heel/Achille's pain and was referred to PT of which she's completed 7 visits.  Today, pt reports her R Achille's and lateral ankle pain has worsened.  She also notes that her L knee pain has returned and is almost as bad as it was.  She also notes some L lateral thigh pain.  Her R ankle and Achille's hurt the most first thing in the morning and when she first gets up to walk after resting for an extended period of time.   ? ?Diagnostic testing: L knee MRI- 04/04/21; B knee XR- 03/11/21;  ? ?Pertinent review of systems: No fevers or chills ? ?Relevant historical information: Hypertension ? ? ?Exam:  ?BP 112/72 (BP Location: Right Arm, Patient Position: Sitting, Cuff Size: Normal)   Pulse 70   Ht 5\' 2"  (1.575 m)   Wt 149 lb 9.6 oz (67.9 kg)   SpO2 96%   BMI 27.36 kg/m?  ?General: Well Developed, well nourished, and in no acute distress.  ? ?MSK: Right ankle swelling at the posterior ankle favoring the lateral aspect of the ankle.  Tender palpation in this area.  Normal ankle motion.  Some pain with resisted foot plantarflexion. ? ?Left knee mild effusion.  Normal motion with crepitation. ? ? ? ?Lab and Radiology Results ? ?Procedure: Real-time Ultrasound Guided Injection of right retrocalcaneal bursa ?Device: Philips Affiniti 50G ?Images permanently stored and available for review in PACS ?Ultrasound evaluation prior to injection reveals a moderate retrocalcaneal bursitis.  No evidence of significant peroneal tendinitis. ?Verbal informed consent obtained.  Discussed risks and benefits of procedure. Warned about infection,  bleeding, hyperglycemia damage to structures among others. ?Patient expresses understanding and agreement ?Time-out conducted.   ?Noted no overlying erythema, induration, or other signs of local infection.   ?Skin prepped in a sterile fashion.   ?Local anesthesia: Topical Ethyl chloride.   ?With sterile technique and under real time ultrasound guidance:  40mg  of kenalog and 2 mL of Marcaine injected into knee joint. Fluid seen entering the joint capsule.   ?Completed without difficulty   ?Pain immediately resolved suggesting accurate placement of the medication.   ?Advised to call if fevers/chills, erythema, induration, drainage, or persistent bleeding.   ?Images permanently stored and available for review in the ultrasound unit.  ?Impression: Technically successful ultrasound guided injection. ? ? ? ? ?Assessment and Plan: ?71 y.o. female with right ankle pain due to retrocalcaneal bursitis.  I think her knee pain is contributing to this because she is limping and putting more pressure through her right ankle.  She has not improved with simple conservative management strategies.  Plan for retrocalcaneal injection today. ? ?Left knee pain due to DJD.  She has an MRI that does show pretty extensive degenerative changes in the knee that I do think is the main source of her pain.  She has failed conservative management including steroid injections and now hyaluronic acid injection series.  At this point I am not very optimistic that further conservative management will help.  I do think her best option is probably  a knee replacement.  After discussion we will refer to orthopedic surgery for a surgical consultation and discussion. ?I do think that if we can get her left knee feeling better her ankle will probably improve. ? ? ?PDMP not reviewed this encounter. ?Orders Placed This Encounter  ?Procedures  ? [place] LIMITED JOINT SPACE STRUCTURES LOW RIGHT(NO LINKED CHARGES)  ?  Order Specific Question:   Reason for Exam  (SYMPTOM  OR DIAGNOSIS REQUIRED)  ?  Answer:   R ankle pain  ?  Order Specific Question:   Preferred imaging location?  ?  Answer:   Adult nurse [facility]  ? Ambulatory referral to Orthopedic Surgery  ?  Referral Priority:   Routine  ?  Referral Type:   Surgical  ?  Referral Reason:   Specialty Services Required  ?  Requested Specialty:   Orthopedic Surgery  ?  Number of Visits Requested:   1  ? ?No orders of the defined types were placed in this encounter. ? ? ? ?Discussed warning signs or symptoms. Please see discharge instructions. Patient expresses understanding. ? ? ?The above documentation has been reviewed and is accurate and complete Clementeen Graham, M.D. ? ? ?

## 2021-07-09 NOTE — Patient Instructions (Addendum)
Good to see you today. ? ?You had a retrocalcaneal injection.  Call or go to the ER if you develop a large red swollen joint with extreme pain or oozing puss.  ? ?I've referred you to [facility].  Their office will call you to schedule but please let us know if you don't hear from them in one week regarding scheduling. ? ?Follow-up: as needed ?

## 2021-07-18 ENCOUNTER — Other Ambulatory Visit: Payer: Self-pay | Admitting: Family Medicine

## 2021-07-18 DIAGNOSIS — Z1231 Encounter for screening mammogram for malignant neoplasm of breast: Secondary | ICD-10-CM

## 2021-08-01 ENCOUNTER — Ambulatory Visit
Admission: RE | Admit: 2021-08-01 | Discharge: 2021-08-01 | Disposition: A | Discharge status: Home / Self Care | Payer: Medicare HMO | Source: Ambulatory Visit | Attending: Family Medicine | Admitting: Family Medicine

## 2021-08-01 DIAGNOSIS — Z1231 Encounter for screening mammogram for malignant neoplasm of breast: Secondary | ICD-10-CM

## 2021-08-01 IMAGING — MG MM DIGITAL SCREENING BILAT W/ TOMO AND CAD
6 of 10 series · 6 of 30 positions shown · non-contrast
Comparison: Previous exam(s).

CLINICAL DATA: Screening.

EXAM:
DIGITAL SCREENING BILATERAL MAMMOGRAM WITH TOMOSYNTHESIS AND CAD
TECHNIQUE: Bilateral screening digital craniocaudal and mediolateral oblique
mammograms were obtained. Bilateral screening digital breast
tomosynthesis was performed. The images were evaluated with
computer-aided detection.

[R MLO synth-2D]
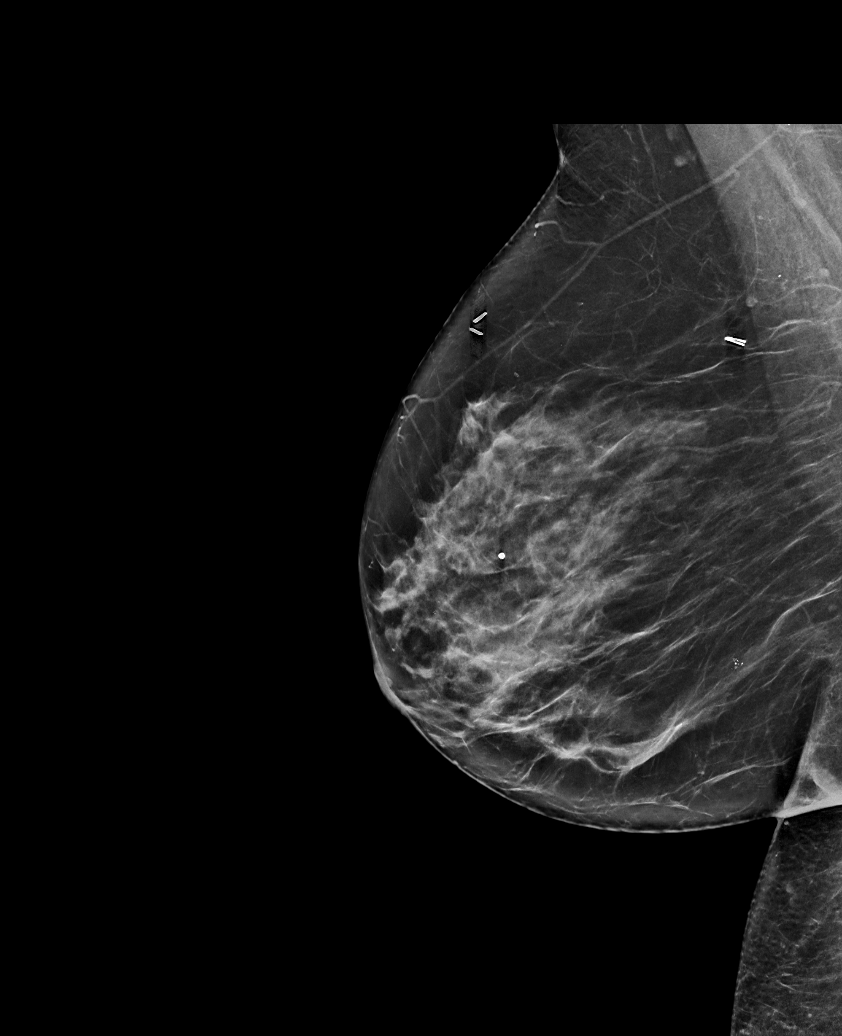

[L MLO synth-2D]
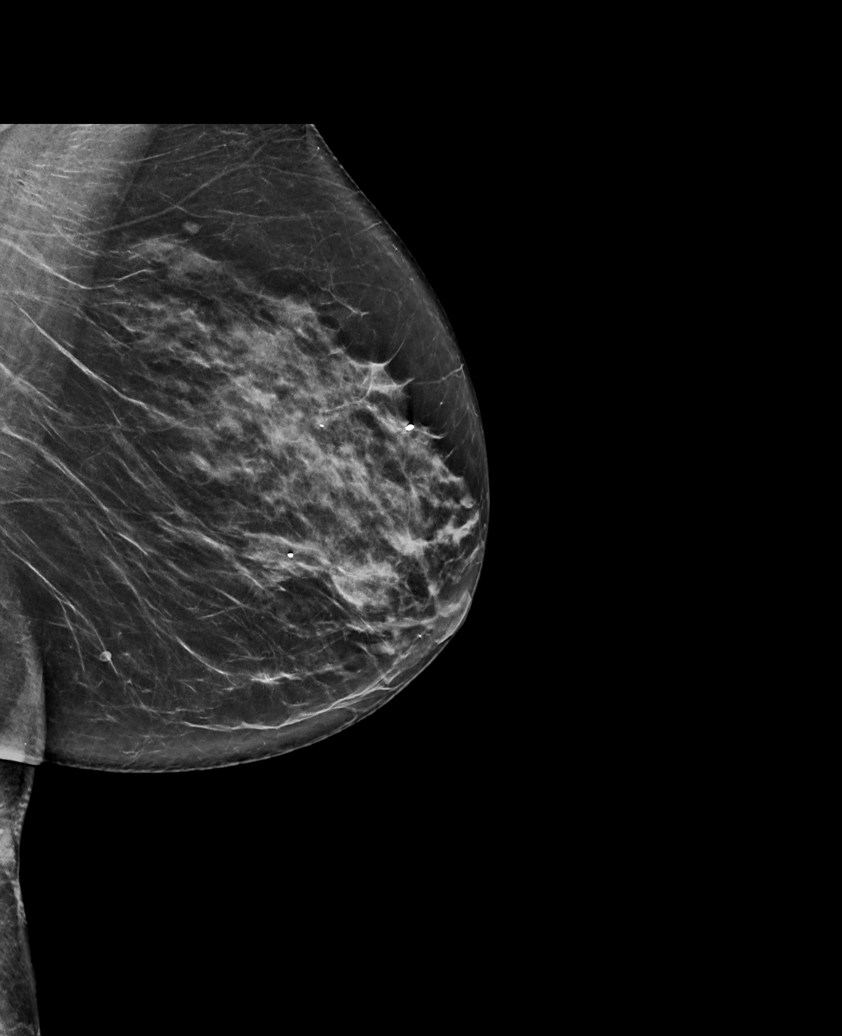

[L CC synth-2D (1 of 2)]
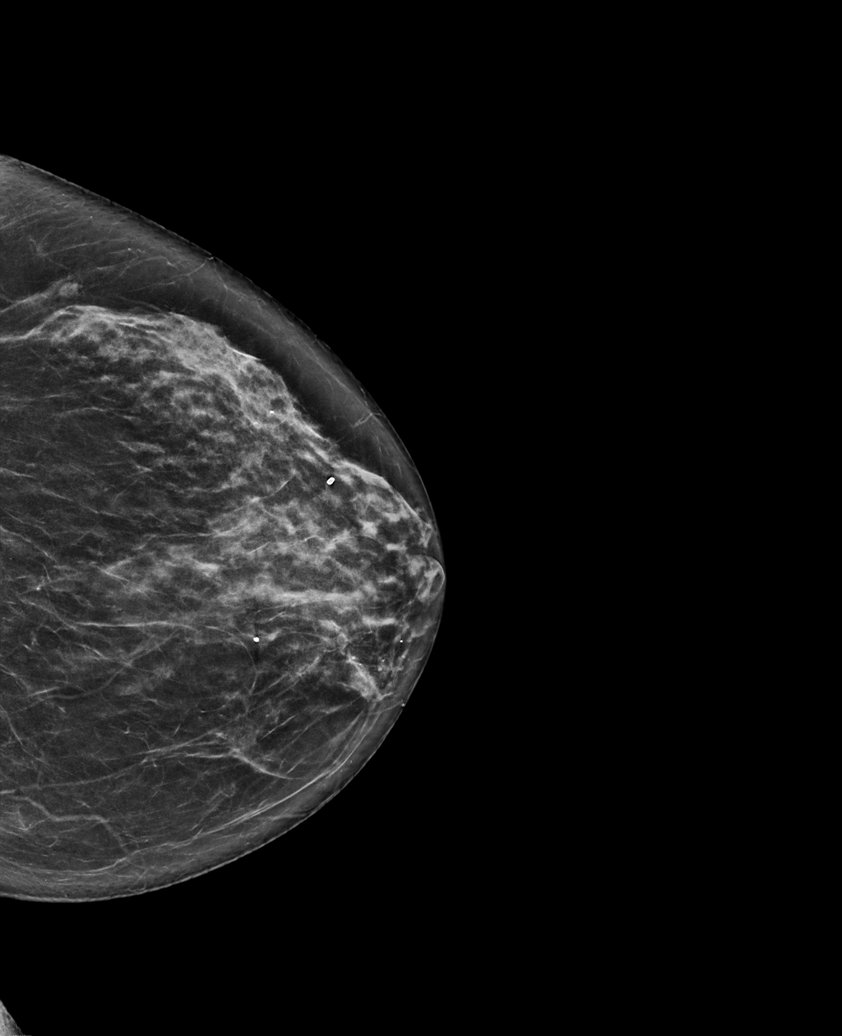

[L CC synth-2D (2 of 2)]
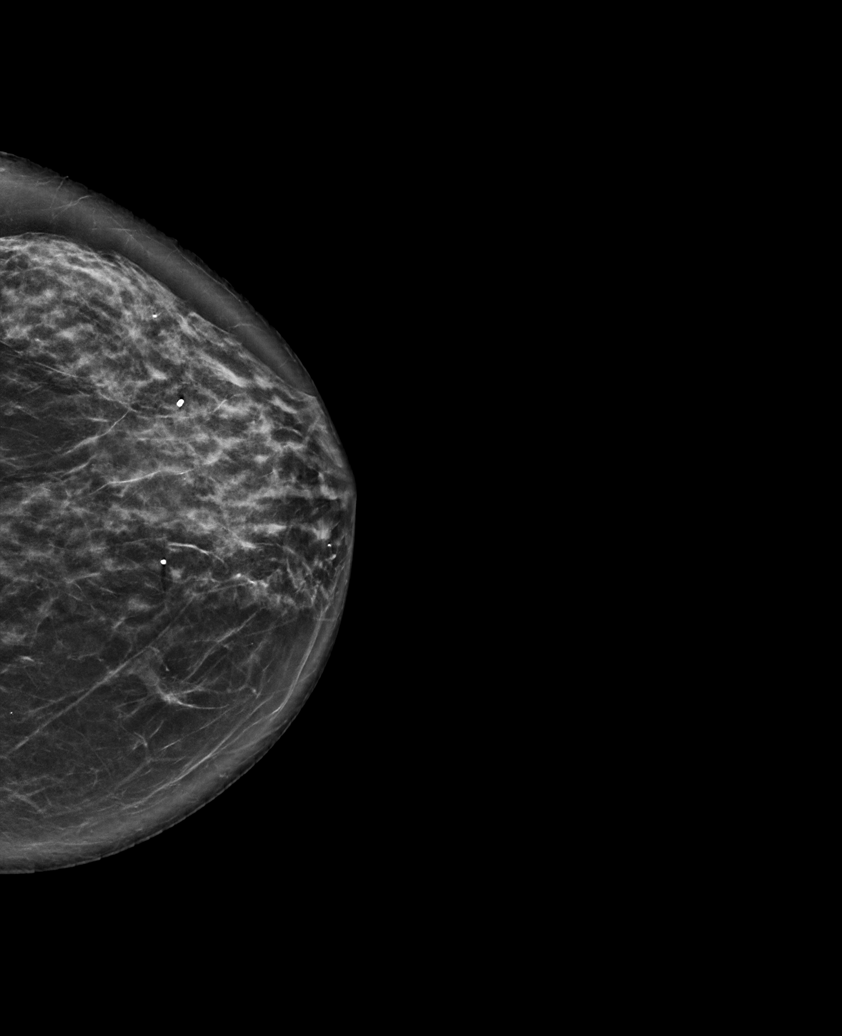

[R CC synth-2D]
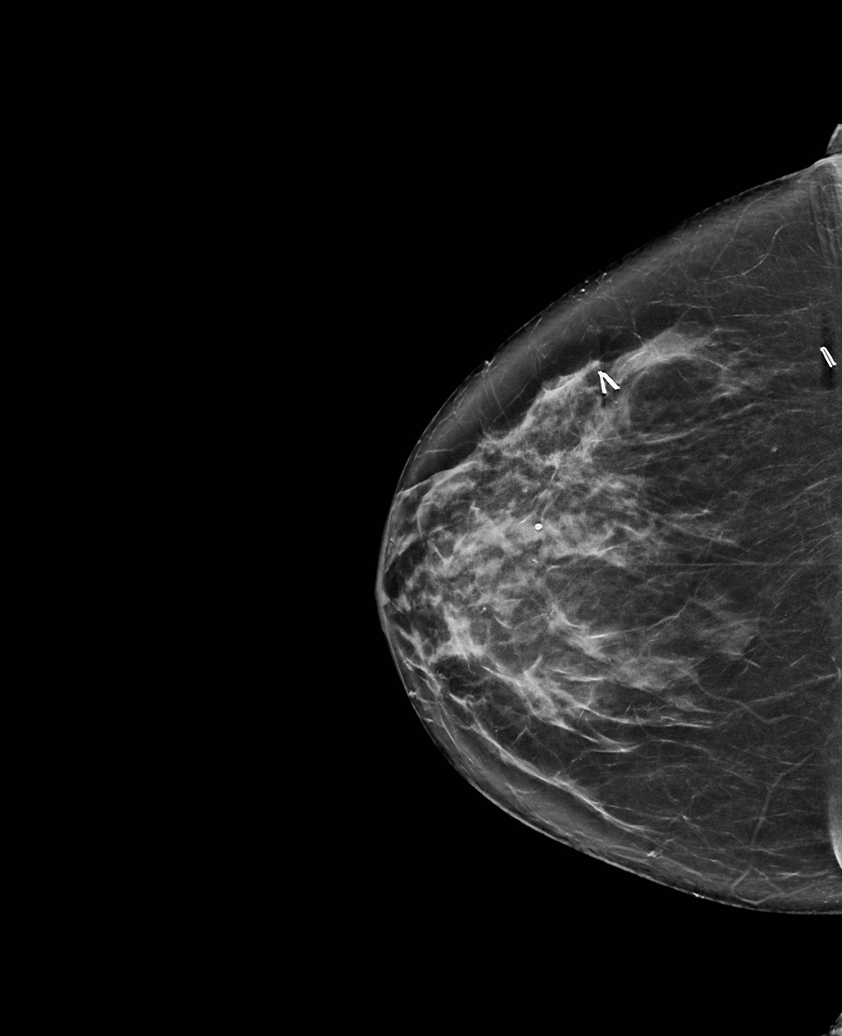

[R CC tomo · tomo slice 38/75.0]
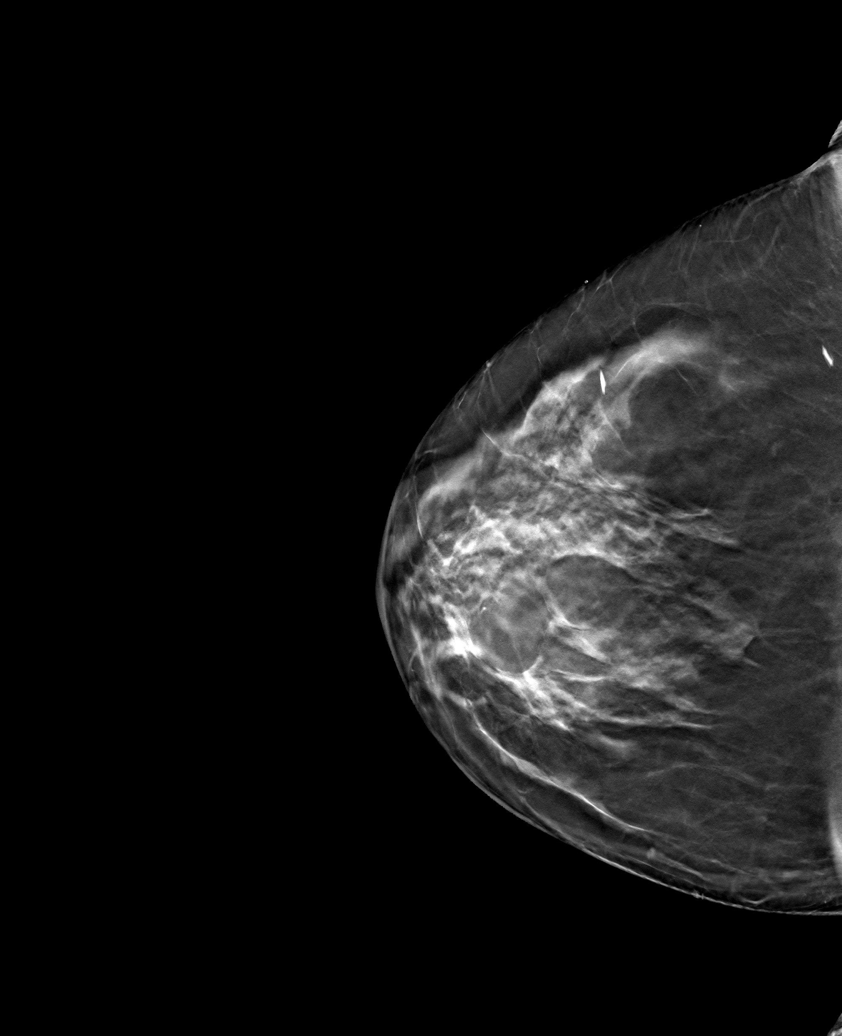

[6 of 30 positions shown; findings below may reference images not displayed]

ACR Breast Density Category c: The breast tissue is heterogeneously
dense, which may obscure small masses.
FINDINGS: There are no findings suspicious for malignancy.
IMPRESSION: No mammographic evidence of malignancy. A result letter of this
screening mammogram will be mailed directly to the patient.

RECOMMENDATION:
Screening mammogram in one year. (Code:[V2])

BI-RADS CATEGORY  1: Negative.

## 2021-08-05 ENCOUNTER — Ambulatory Visit: Payer: Medicare HMO | Admitting: Orthopedic Surgery

## 2021-08-05 VITALS — Ht 62.0 in | Wt 128.2 lb

## 2021-08-05 DIAGNOSIS — M1712 Unilateral primary osteoarthritis, left knee: Secondary | ICD-10-CM | POA: Diagnosis not present

## 2021-08-06 ENCOUNTER — Telehealth: Payer: Self-pay | Admitting: Orthopedic Surgery

## 2021-08-06 NOTE — Telephone Encounter (Signed)
Patient called to schedule surgery.  Patient is Dr. [name] patient. ?The number to contact patient is [phone]  ?

## 2021-08-08 ENCOUNTER — Other Ambulatory Visit: Payer: Self-pay | Admitting: Family Medicine

## 2021-08-08 ENCOUNTER — Encounter: Payer: Self-pay | Admitting: *Deleted

## 2021-08-08 DIAGNOSIS — R269 Unspecified abnormalities of gait and mobility: Secondary | ICD-10-CM

## 2021-08-08 DIAGNOSIS — G43709 Chronic migraine without aura, not intractable, without status migrainosus: Secondary | ICD-10-CM

## 2021-08-08 DIAGNOSIS — R413 Other amnesia: Secondary | ICD-10-CM

## 2021-08-11 ENCOUNTER — Encounter: Payer: Self-pay | Admitting: Orthopedic Surgery

## 2021-08-11 NOTE — Progress Notes (Signed)
? ?Office Visit Note ?  ?Patient: Caroline [name]           ?Date of Birth: [birth year]           ?MRN: [id number] ?Visit Date: 08/05/2021 ?Requested by: Gregor Hams, MD ?[place],  [place] [address] ?PCP: Buzzy Han, MD (Inactive) ? ?Subjective: ?Chief Complaint  ?Patient presents with  ? Left Knee - Pain  ? ? ?HPI: Patient presents with 5-year history of left knee pain.  Pain has been worse over the past year.  Physical therapy helped for a while.  She describes stiffness as well as pain with daily activities of daily living.  Had left arthroscopy 5 years ago.  Had viscosupplementation injections about 3 months ago which helped her only temporarily.  No personal or family history of DVT or pulmonary embolism.  The pain keeps her from walking and shopping.  Denies any groin pain or low back pain.  She does like to walk for exercise.  She describes decreased activity with her grandchildren.  No stairs at home.  Husband is with her today.  Otherwise healthy. ?             ?ROS: All systems reviewed are negative as they relate to the chief complaint within the history of present illness.  Patient denies  fevers or chills. ? ? ?Assessment & Plan: ?Visit Diagnoses:  ?1. Arthritis of left knee   ? ? ?Plan: Impression is left knee arthritis with failure of conservative management in a patient who is otherwise healthy.  I think she is a reasonable candidate for total knee replacement.  Risk and benefits of the total knee replacement are discussed including but not limited to infection nerve vessel damage knee stiffness incomplete pain resolution as well as incomplete restoration of function.  She does have predominantly medial and patellofemoral arthritis with slight varus alignment.  The extensive rehabilitative effort required for successful knee replacement surgery was also discussed with and and her husband.  Patient understands risk and benefits and wishes to proceed.  All questions  answered. ? ?Follow-Up Instructions: No follow-ups on file.  ? ?Orders:  ?No orders of the defined types were placed in this encounter. ? ?No orders of the defined types were placed in this encounter. ? ? ? ? Procedures: ?No procedures performed ? ? ?Clinical Data: ?No additional findings. ? ?Objective: ?Vital Signs: Ht 5\' 2"  (1.575 m)   Wt 128 lb 3.2 oz (58.2 kg)   BMI 23.45 kg/m?  ? ?Physical Exam:  ? ?Constitutional: Patient appears well-developed ?HEENT:  ?Head: Normocephalic ?Eyes:EOM are normal ?Neck: Normal range of motion ?Cardiovascular: Normal rate ?Pulmonary/chest: Effort normal ?Neurologic: Patient is alert ?Skin: Skin is warm ?Psychiatric: Patient has normal mood and affect ? ? ?Ortho Exam: Ortho exam demonstrates slight varus alignment of that left leg.  Pedal pulses palpable.  No pitting edema bilateral lower extremities.  No groin pain on the left with internal and external rotation of the leg.  Extensor mechanism intact.  Collateral and cruciate ligaments are stable.  Range of motion is approximately 5-1 10.  Has medial greater than lateral joint line tenderness. ? ?Specialty Comments:  ?No specialty comments available. ? ?Imaging: ?No results found. ? ? ?PMFS History: ?Patient Active Problem List  ? Diagnosis Date Noted  ? Postcalcaneal bursitis of right foot 03/11/2021  ? Primary osteoarthritis of both knees 03/11/2021  ? Chronic pain of both knees 03/11/2021  ? Asthma due to seasonal allergies 11/17/2019  ?  Osteopenia of multiple sites 10/19/2018  ? Chronic sinusitis 06/10/2018  ? Essential hypertension, benign 10/22/2017  ? Benign hematuria 06/17/2017  ? Pure hypercholesterolemia 08/11/2016  ? Anxiety and depression 08/11/2016  ? Chronic migraine without aura without status migrainosus, not intractable 08/11/2016  ? ?Past Medical History:  ?Diagnosis Date  ? Allergy   ? Zyrtec, Benadryl  ? Anxiety   ? Zoloft since several years  ? Balance problem   ? Cataract   ? GERD (gastroesophageal reflux  disease)   ? Hyperlipidemia   ? Hypertension   ? Memory changes   ? Migraines   ? topmax and Imitrex  ?  ?Family History  ?Problem Relation Age of Onset  ? Hypertension Mother   ? Stroke Mother   ? Dementia Mother   ? Cancer Father 75  ?     oral cancer  ? Heart disease Father   ?     PAD/femoral bypass  ? Hypertension Father   ? Hyperlipidemia Father   ? Alcohol abuse Father   ? Hyperlipidemia Sister   ? Hypertension Sister   ? Breast cancer Maternal Aunt   ?  ?Past Surgical History:  ?Procedure Laterality Date  ? ABDOMINAL HYSTERECTOMY  2016  ? uterine prolapse; ovaries INTACT  ? BLADDER REPAIR    ? BREAST BIOPSY    ? NL  ? BREAST EXCISIONAL BIOPSY    ? BREAST SURGERY    ? breast bx x 2 in 1980s; benign  ? CARPAL TUNNEL RELEASE Bilateral   ? CATARACT EXTRACTION, BILATERAL  2018  ? ?Social History  ? ?Occupational History  ? Occupation: employed  ?Tobacco Use  ? Smoking status: Former  ?  Types: Cigarettes  ?  Quit date: 04/07/1968  ?  Years since quitting: 53.3  ? Smokeless tobacco: Never  ?Substance and Sexual Activity  ? Alcohol use: Yes  ?  Comment: rarely wine  ? Drug use: No  ? Sexual activity: Yes  ?  Birth control/protection: Post-menopausal, Surgical  ? ? ? ? ? ?

## 2021-08-12 ENCOUNTER — Ambulatory Visit: Payer: Medicare HMO | Admitting: Diagnostic Neuroimaging

## 2021-08-12 ENCOUNTER — Encounter: Payer: Self-pay | Admitting: Diagnostic Neuroimaging

## 2021-08-12 VITALS — BP 126/81 | HR 61 | Ht 62.0 in | Wt 146.0 lb

## 2021-08-12 DIAGNOSIS — R413 Other amnesia: Secondary | ICD-10-CM | POA: Diagnosis not present

## 2021-08-12 DIAGNOSIS — R269 Unspecified abnormalities of gait and mobility: Secondary | ICD-10-CM | POA: Diagnosis not present

## 2021-08-12 DIAGNOSIS — G43109 Migraine with aura, not intractable, without status migrainosus: Secondary | ICD-10-CM | POA: Diagnosis not present

## 2021-08-12 MED ORDER — SUMATRIPTAN SUCCINATE 100 MG PO TABS: ORAL_TABLET | ORAL | 6 refills | Status: DC

## 2021-08-12 MED ORDER — TOPIRAMATE 50 MG PO TABS: 50.0000 mg | ORAL_TABLET | Freq: Two times a day (BID) | ORAL | 12 refills | Status: DC

## 2021-08-12 NOTE — Patient Instructions (Signed)
?  MIGRAINE WITH AURA ?- sumatriptan as needed ?- start topiramate 50mg  at bedtime; then increase to twice a day ? ? ?SHORT TERM MEMORY LOSS ?- could be related to pain, anxiety, depression issues ?- safety / supervision issues reviewed ?- daily physical activity / exercise (at least 15-30 minutes) ?- eat more plants / vegetables ?- increase social activities, brain stimulation, games, puzzles, hobbies, crafts, arts, music ?- aim for at least 7-8 hours sleep per night (or more) ?- avoid smoking and alcohol ?- caregiver resources provided ?- caution with medications, finances, driving ? ? ?DIZZINESS / BALANCE ISSUES (since ~2015; also has left knee, right achilles pain issues) ?- follow up MRI brain  ?- continue PT evaluation ?

## 2021-08-12 NOTE — Progress Notes (Signed)
? ?[facility] ? ?PATIENT: Caroline [name] ?DOB: 30-Dec-1950 ? ?REFERRING CLINICIAN: Aliene Beams, MD ?HISTORY FROM: patient and husband ?REASON FOR VISIT: new consult ? ? ?HISTORICAL ? ?CHIEF COMPLAINT:  ?Chief Complaint  ?Patient presents with  ? Gait Problem  ?  Rm 6, New Pt  husband- Kathlene November  TOC  "balance is off, dizziness and falls; short term memory loss"  ? Migraine  ?  "Need rescue med continued"  ? Memory Loss  ?  MMSE 28  ? ? ?HISTORY OF PRESENT ILLNESS:  ? ?71 year old female here for evaluation of migraines, gait and balance difficulty, memory loss. ? ?Regarding migraines patient has had headaches with right-sided pain, nausea, seeing spots, sensitive light and sound, sensitive to smells, since teenage years.  Triggering factors include stress and tension, red wine and tequila.  Sometimes skipping meals also will trigger headaches.  Patient has tried sumatriptan in the past which seems to help.  She is averaging 4 to 12 days of migraine per month.  Other medicines tried include Excedrin, tramadol, Nurtec, Toprol, sertraline, Wellbutrin and topiramate and zonisamide. ? ?Also having some issues with short-term memory problems and decreased attention.  No major change in ADLs.  She is under severe stress related to husband's health and other factors.  Also with chronic anxiety and depression issues on medications and counseling treatments. ? ?Also with some intermittent gait stability since 2015, when she started to have knee and ankle problems.  Left knee and right ankle are affected.  She has had some falls.  She has been less active than before.  ? ? ?REVIEW OF SYSTEMS: Full 14 system review of systems performed and negative with exception of: as per HPI. ? ?ALLERGIES: ?Allergies  ?Allergen Reactions  ? Tetracyclines & Related Nausea And Vomiting  ? Vicodin [Hydrocodone-Acetaminophen] Nausea And Vomiting  ?  dizzy  ? ? ?HOME MEDICATIONS: ?Outpatient Medications Prior to Visit  ?Medication  Sig Dispense Refill  ? albuterol (VENTOLIN HFA) 108 (90 Base) MCG/ACT inhaler Inhale 2 puffs into the lungs every 6 (six) hours as needed for wheezing or shortness of breath. 8 g 3  ? cetirizine (ZYRTEC) 10 MG tablet Take 10 mg by mouth daily.    ? famotidine (PEPCID) 20 MG tablet Take 1 tablet (20 mg total) by mouth 2 (two) times daily. 60 tablet 3  ? fluticasone (FLONASE) 50 MCG/ACT nasal spray Place 1 spray into both nostrils 2 (two) times daily. 16 g 6  ? hydrOXYzine (ATARAX) 10 MG tablet Take 10 mg by mouth 3 (three) times daily as needed.    ? metoprolol succinate (TOPROL-XL) 100 MG 24 hr tablet TAKE 1 TABLET EVERY DAY. TAKE WITH OR IMMEDIATELY FOLLOWING A MEAL. 90 tablet 3  ? ondansetron (ZOFRAN) 8 MG tablet Take 1 tablet (8 mg total) by mouth every 8 (eight) hours as needed for nausea or vomiting. 30 tablet 5  ? rosuvastatin (CRESTOR) 10 MG tablet Take 10 mg by mouth at bedtime.    ? sertraline (ZOLOFT) 100 MG tablet TAKE 1 AND 1/2 TABLETS EVERY DAY 135 tablet 3  ? SINGULAIR 10 MG tablet Take 1 tablet by mouth daily.    ? SYMBICORT 80-4.5 MCG/ACT inhaler Inhale 1 puff using inhaler twice a day    ? SUMAtriptan (IMITREX) 100 MG tablet TAKE 1 TABLET AS NEEDED.  MAY REPEAT IN 2 HOURS IF HEADACHE PERSISTS OR RECURS.  MAXIMUM 2 TABLETS IN 24 HOURS. 9 tablet 0  ? pravastatin (PRAVACHOL) 40 MG tablet TAKE  1 TABLET (40 MG TOTAL) BY MOUTH DAILY. 90 tablet 3  ? traMADol (ULTRAM) 50 MG tablet Take 1 tablet (50 mg total) by mouth every 8 (eight) hours as needed. (Patient not taking: Reported on 08/12/2021) 20 tablet 0  ? zonisamide (ZONEGRAN) 50 MG capsule Take 2 capsules (100 mg total) by mouth daily. (Patient not taking: Reported on 08/12/2021) 180 capsule 3  ? ?No facility-administered medications prior to visit.  ? ? ?PAST MEDICAL HISTORY: ?Past Medical History:  ?Diagnosis Date  ? Allergy   ? Zyrtec, Benadryl  ? Anxiety   ? Zoloft since several years  ? Balance problem   ? Cataract   ? GERD (gastroesophageal reflux  disease)   ? Hyperlipidemia   ? Hypertension   ? Memory changes   ? Migraines   ? topmax and Imitrex  ? ? ?PAST SURGICAL HISTORY: ?Past Surgical History:  ?Procedure Laterality Date  ? ABDOMINAL HYSTERECTOMY  2016  ? uterine prolapse; ovaries INTACT  ? BLADDER REPAIR    ? BREAST BIOPSY    ? NL  ? BREAST EXCISIONAL BIOPSY    ? BREAST SURGERY    ? breast bx x 2 in 1980s; benign  ? CARPAL TUNNEL RELEASE Bilateral   ? CATARACT EXTRACTION, BILATERAL  2018  ? ? ?FAMILY HISTORY: ?Family History  ?Problem Relation Age of Onset  ? Hypertension Mother   ? Stroke Mother   ? Dementia Mother   ? Cancer Father [id number]  ?     oral cancer  ? Heart disease Father   ?     PAD/femoral bypass  ? Hypertension Father   ? Hyperlipidemia Father   ? Alcohol abuse Father   ? Hyperlipidemia Sister   ? Hypertension Sister   ? Breast cancer Maternal Aunt   ? ? ?SOCIAL HISTORY: ?Social History  ? ?Socioeconomic History  ? Marital status: Married  ?  Spouse name: Casimiro NeedleMichael  ? Number of children: 2  ? Years of education: Not on file  ? Highest education level: 12th grade  ?Occupational History  ? Occupation: employed  ?Tobacco Use  ? Smoking status: Former  ?  Types: Cigarettes  ?  Quit date: 04/07/1968  ?  Years since quitting: 53.3  ? Smokeless tobacco: Never  ?Substance and Sexual Activity  ? Alcohol use: Yes  ?  Comment: rarely wine  ? Drug use: No  ? Sexual activity: Yes  ?  Birth control/protection: Post-menopausal, Surgical  ?Other Topics Concern  ? Not on file  ?Social History Narrative  ? Marital status: married x 45 years; from [place]  ?    Children: 2 children (one in ; one in [place] ); 2 grandchildren  ?    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    Tobacco: quit smoking; smoked x 15-25.  ?    Alcohol: rare  ?    Exercise: none  ?   ? Patient is right-handed. She lives with her husband in one level home. She rarely drinks caffeine. She is active at work.  ? ?Social Determinants of Health   ? ?Financial Resource Strain: Not on file  ?Food Insecurity: Not on file  ?Transportation Needs: Not on file  ?Physical Activity: Not on file  ?Stress: Not on file  ?Social Connections: Not on file  ?Intimate Partner Violence: Not on file  ? ? ? ?PHYSICAL EXAM ? ?GENERAL EXAM/CONSTITUTIONAL: ?Vitals:  ?Vitals:  ? 08/12/21 1020  ?BP: 126/81  ?Pulse: 61  ?  Weight: 146 lb (66.2 kg)  ?Height: 5\' 2"  (1.575 m)  ? ?Body mass index is 26.7 kg/m?. ?Wt Readings from Last 3 Encounters:  ?08/12/21 146 lb (66.2 kg)  ?08/05/21 128 lb 3.2 oz (58.2 kg)  ?07/09/21 149 lb 9.6 oz (67.9 kg)  ? ?Patient is in no distress; well developed, nourished and groomed; neck is supple ? ?CARDIOVASCULAR: ?Examination of carotid arteries is normal; no carotid bruits ?Regular rate and rhythm, no murmurs ?Examination of peripheral vascular system by observation and palpation is normal ? ?EYES: ?Ophthalmoscopic exam of optic discs and posterior segments is normal; no papilledema or hemorrhages ?No results found. ? ?MUSCULOSKELETAL: ?Gait, strength, tone, movements noted in Neurologic exam below ? ?NEUROLOGIC: ?MENTAL STATUS:  ? ?  08/12/2021  ? 10:23 AM  ?MMSE - Mini Mental State Exam  ?Orientation to time 5  ?Orientation to Place 4  ?Registration 3  ?Attention/ Calculation 4  ?Recall 3  ?Language- name 2 objects 2  ?Language- repeat 1  ?Language- follow 3 step command 3  ?Language- read & follow direction 1  ?Write a sentence 1  ?Copy design 1  ?Total score 28  ? ?awake, alert, oriented to person, place and time ?recent and remote memory intact ?normal attention and concentration ?language fluent, comprehension intact, naming intact ?fund of knowledge appropriate ? ?CRANIAL NERVE:  ?2nd - no papilledema on fundoscopic exam ?2nd, 3rd, 4th, 6th - pupils equal and reactive to light, visual fields full to confrontation, extraocular muscles intact, no nystagmus ?5th - facial sensation symmetric ?7th - facial strength symmetric ?8th - hearing intact ?9th -  palate elevates symmetrically, uvula midline ?11th - shoulder shrug symmetric ?12th - tongue protrusion midline ? ?MOTOR:  ?normal bulk and tone, full strength in the BUE, BLE ? ?SENSORY:  ?normal and symmetric to lig

## 2021-08-16 ENCOUNTER — Other Ambulatory Visit: Payer: Self-pay

## 2021-08-21 ENCOUNTER — Other Ambulatory Visit: Payer: Medicare HMO

## 2021-08-29 ENCOUNTER — Other Ambulatory Visit: Payer: Self-pay | Admitting: Neurology

## 2021-08-31 ENCOUNTER — Ambulatory Visit
Admission: RE | Admit: 2021-08-31 | Discharge: 2021-08-31 | Disposition: A | Discharge status: Home / Self Care | Payer: Medicare HMO | Source: Ambulatory Visit | Attending: Family Medicine | Admitting: Family Medicine

## 2021-08-31 DIAGNOSIS — R413 Other amnesia: Secondary | ICD-10-CM

## 2021-08-31 DIAGNOSIS — G43709 Chronic migraine without aura, not intractable, without status migrainosus: Secondary | ICD-10-CM

## 2021-08-31 DIAGNOSIS — R269 Unspecified abnormalities of gait and mobility: Secondary | ICD-10-CM

## 2021-08-31 IMAGING — MR MR HEAD WO/W CM
13 series · 48 of 48 positions shown · IV contrast (multihance)
Comparison: CTA head/neck [DATE]

CLINICAL DATA: Confusion, intermittent balance issues, occasional
falls with headaches. Difficulty sleeping.

EXAM:
MRI HEAD WITHOUT AND WITH CONTRAST
TECHNIQUE: Multiplanar, multiecho pulse sequences of the brain and surrounding
structures were obtained without and with intravenous contrast.
CONTRAST:  13mL MULTIHANCE GADOBENATE DIMEGLUMINE 529 MG/ML IV SOLN

[Series 5: T1 · sagittal · 4.0mm · 0.75mm/px · 1 of 31 slices shown (1 of 3)]
[im 1/31]
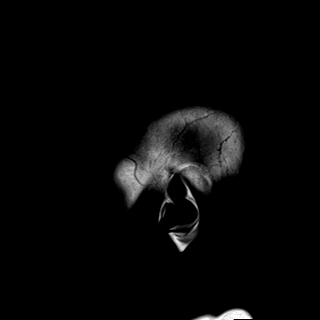

[Series 6: DWI · axial · 3.0mm · 0.94mm/px · z∈[-68,+83]mm · 7 of 172 slices shown]
[im 1/172]
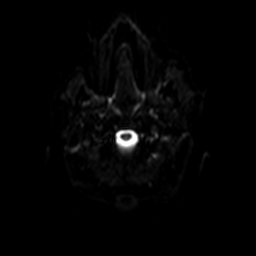
[im 29/172]
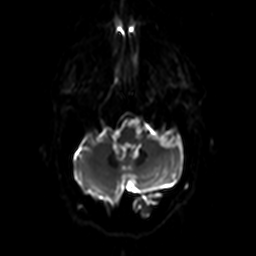
[im 58/172]
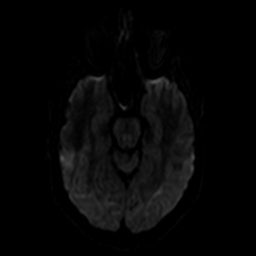
[im 86/172]
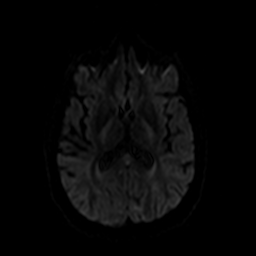
[im 115/172]
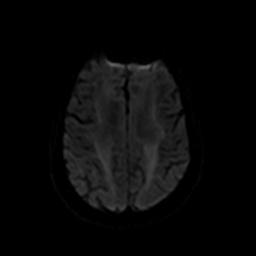
[im 143/172]
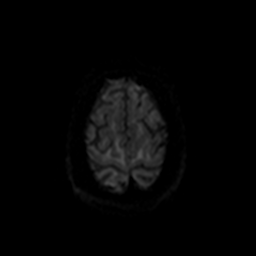
[im 172/172]
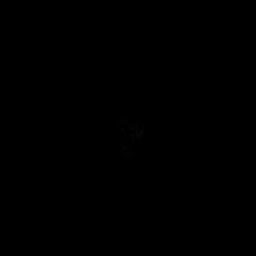

[Series 7: ax dwi_tracew · axial · 3.0mm · 0.94mm/px · z∈[-68,+83]mm · 4 of 86 slices shown]
[im 1/86]
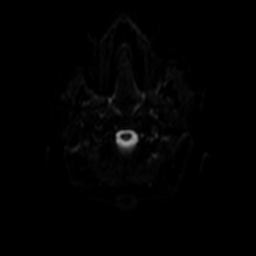
[im 29/86]
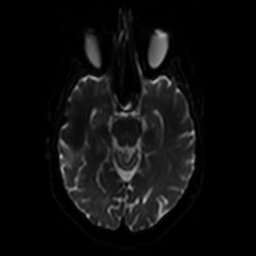
[im 57/86]
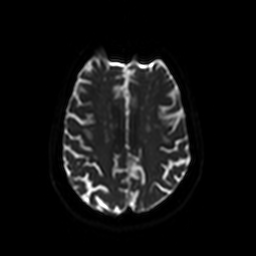
[im 86/86]
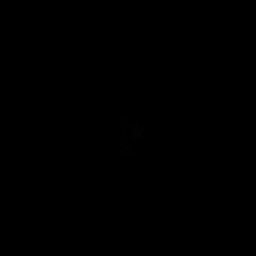

[Series 8: ax dwi_adc · axial · 3.0mm · 0.94mm/px · z∈[-68,+83]mm · 2 of 43 slices shown]
[im 1/43]
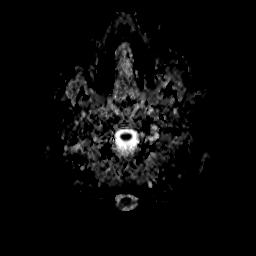
[im 43/43]
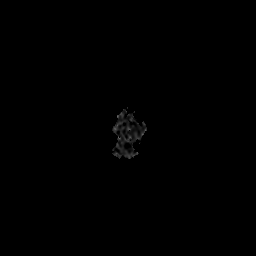

[Series 9: T2 · axial · 4.0mm · 0.36mm/px · z∈[-73,+82]mm · 2 of 31 slices shown]
[im 1/31]
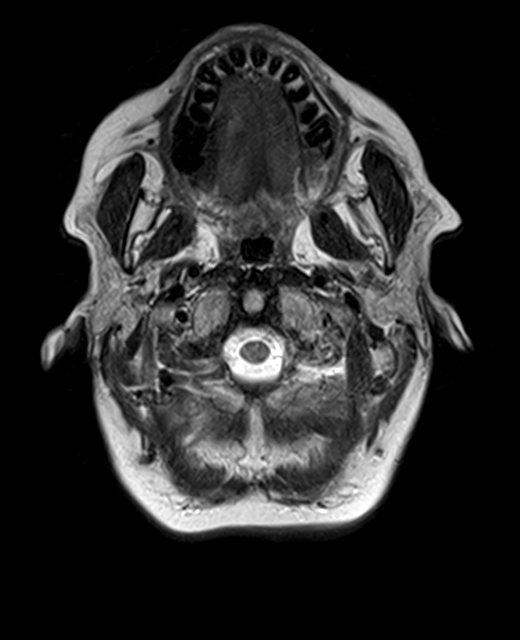
[im 31/31]
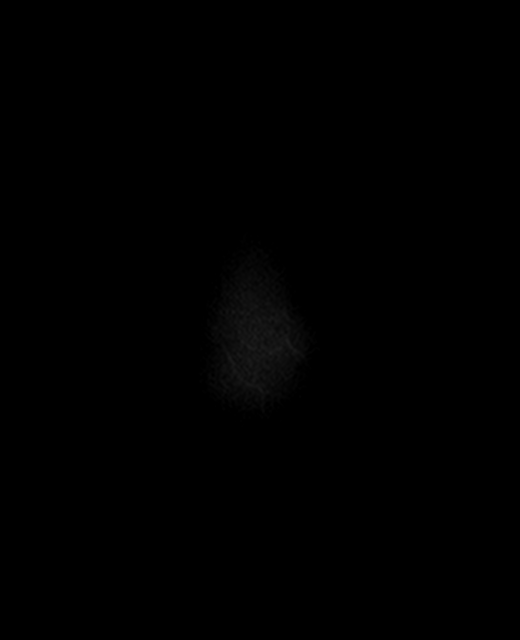

[Series 10: FLAIR · axial · 3.0mm · 0.72mm/px · 1 of 26 slices shown]
[im 1/26]
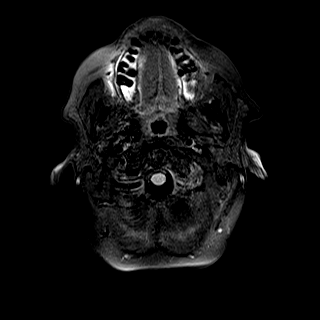

[Series 11: mip_images(sw) · axial · 12.0mm · 0.90mm/px · z∈[-61,+71]mm · 4 of 89 slices shown]
[im 1/89]
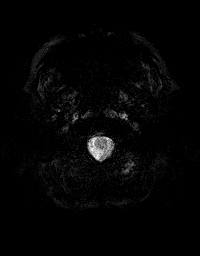
[im 30/89]
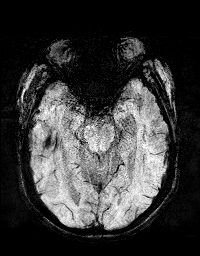
[im 59/89]
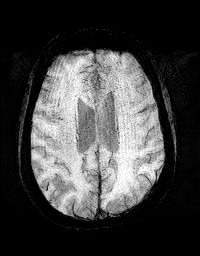
[im 89/89]
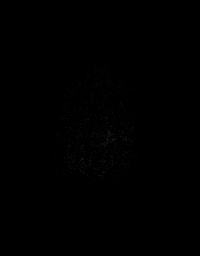

[Series 12: swi_images · axial · 1.5mm · 0.90mm/px · z∈[-67,+76]mm · 5 of 96 slices shown]
[im 1/96]
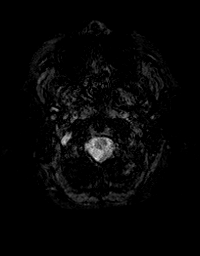
[im 24/96]
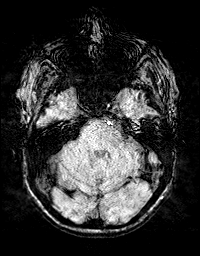
[im 48/96]
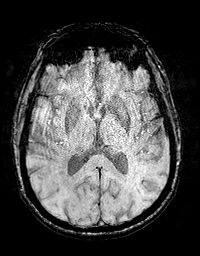
[im 72/96]
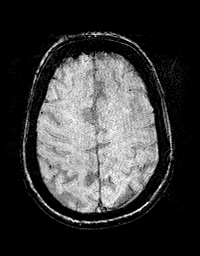
[im 96/96]
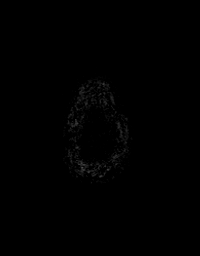

[Series 13: T1 · axial · 1.0mm · 0.94mm/px · z∈[-67,+90]mm · 8 of 158 slices shown (2 of 3)]
[im 1/158]
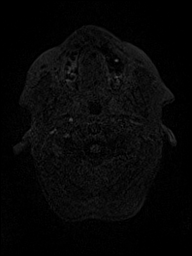
[im 23/158]
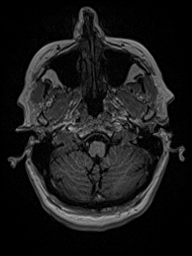
[im 45/158]
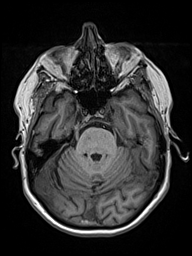
[im 68/158]
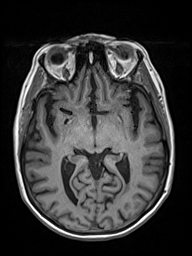
[im 90/158]
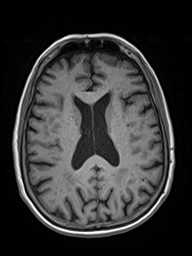
[im 113/158]
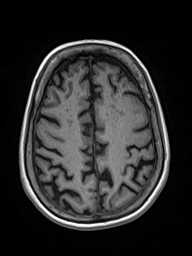
[im 135/158]
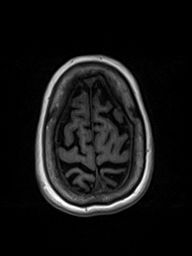
[im 158/158]
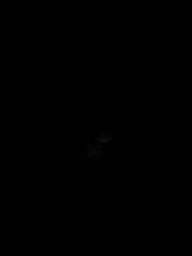

[Series 14: T2 post-contrast · coronal · 4.0mm · 0.36mm/px · 2 of 34 slices shown]
[im 1/34]
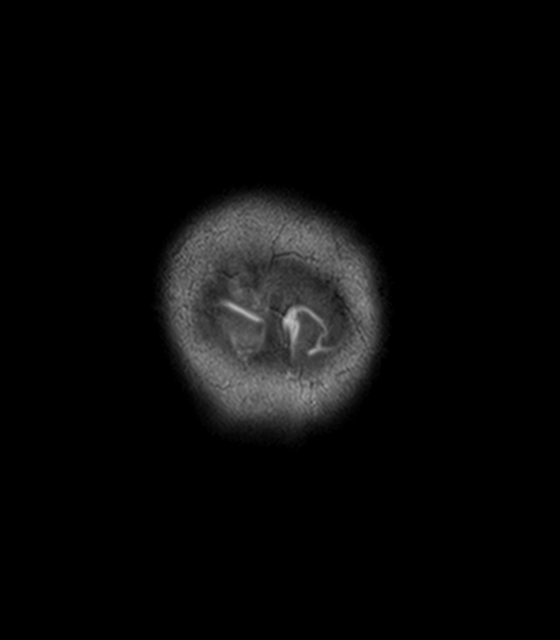
[im 34/34]
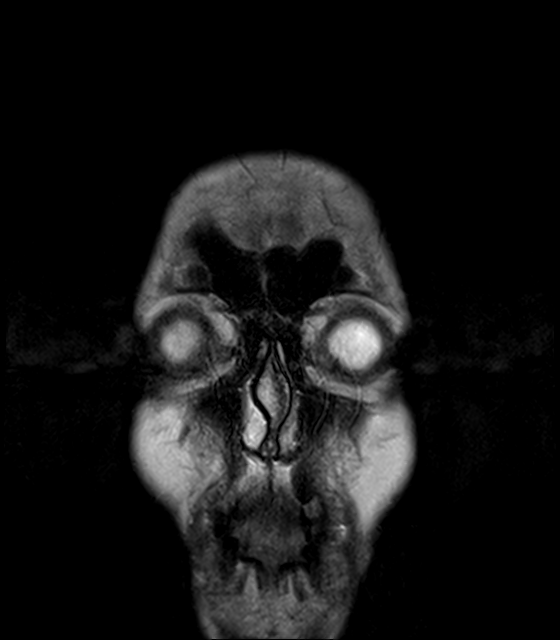

[Series 15: T1 · axial · 1.0mm · 0.94mm/px · z∈[-67,+92]mm · 8 of 160 slices shown (3 of 3)]
[im 1/160]
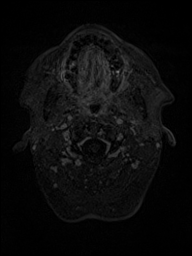
[im 23/160]
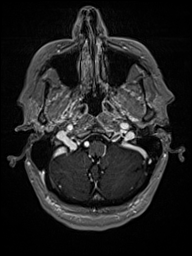
[im 46/160]
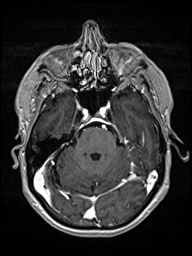
[im 69/160]
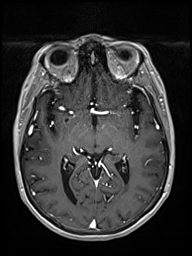
[im 91/160]
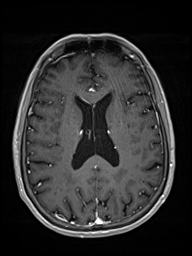
[im 114/160]
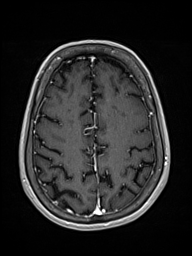
[im 137/160]
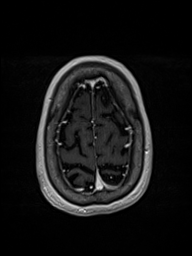
[im 160/160]
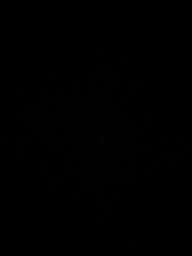

[Series 16: T1 post-contrast · coronal · 4.0mm · 0.72mm/px · 2 of 34 slices shown (1 of 2)]
[im 1/34]
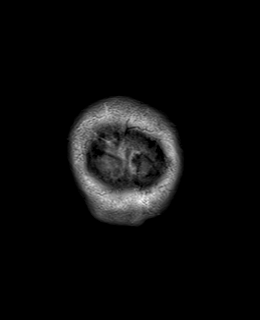
[im 34/34]
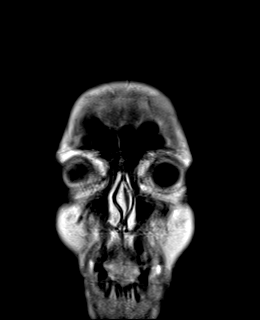

[Series 17: T1 post-contrast · sagittal · 4.0mm · 0.75mm/px · 2 of 31 slices shown (2 of 2)]
[im 1/31]
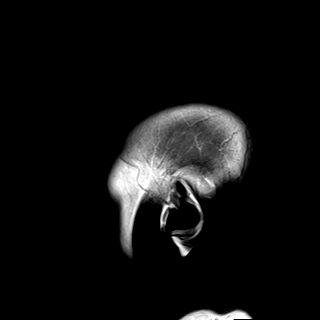
[im 31/31]
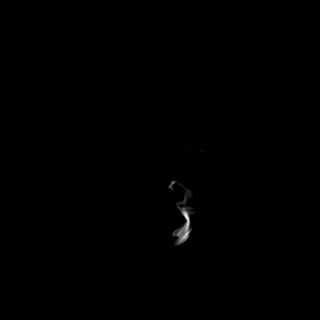

[48 of 48 positions shown; findings below may reference images not displayed]

FINDINGS: Brain: There is no acute intracranial hemorrhage, extra-axial fluid
collection, or acute infarct

Parenchymal volume is normal. The ventricles are normal in size.
There is patchy FLAIR signal abnormality throughout the subcortical
and periventricular white matter likely reflecting sequela of
moderate chronic white matter microangiopathy.

There is no suspicious parenchymal signal abnormality. There is no
mass lesion. There is no abnormal enhancement. There is no mass
effect or midline shift.

Vascular: Normal flow voids.

Skull and upper cervical spine: Normal marrow signal.

Sinuses/Orbits: There is opacification of a posterior left ethmoid
air cell. The other paranasal sinuses are clear. Bilateral lens
implants are in place. The globes and orbits are otherwise
unremarkable.

Other: None.
IMPRESSION: 1. No acute intracranial pathology or other finding to explain the
patient's symptoms.
2. Moderate chronic white matter microangiopathy.

## 2021-08-31 MED ORDER — GADOBENATE DIMEGLUMINE 529 MG/ML IV SOLN
13.0000 mL | Freq: Once | INTRAVENOUS | Status: AC | PRN
  Administered 2021-08-31: 13 mL via INTRAVENOUS

## 2021-09-12 ENCOUNTER — Telehealth: Payer: Self-pay | Admitting: Diagnostic Neuroimaging

## 2021-09-12 NOTE — Telephone Encounter (Signed)
Called patient and informed her the MRI was ordered by her PCP. She will have to call PCP for those results. Patient verbalized understanding, appreciation.

## 2021-09-12 NOTE — Telephone Encounter (Signed)
Pt states it has been about 2 weeks and she has not been contacted about results to MRI, please call when results are available.

## 2021-09-13 NOTE — Telephone Encounter (Signed)
Pt called needing to speak to the RN regarding her MRI results. Please advise.

## 2021-09-16 NOTE — Telephone Encounter (Signed)
Called patient who stated she's not heard about her MRI results. I advised her to call PCP who ordered MRI. She stated she called and no one was able to give her results but advised she see Dr Leta Baptist.  I advised her his note stated 6 month FU and the ordering MD is responsible for giving her results. She stated she will call PCP back. Patient verbalized understanding, appreciation.

## 2021-09-23 NOTE — Pre-Procedure Instructions (Signed)
Surgical Instructions    Your procedure is scheduled on October 01, 2021.  Report to [facility] Main Entrance "A" at 9:00 A.M., then check in with the Admitting office.  Call this number if you have problems the morning of surgery:  [phone]   If you have any questions prior to your surgery date call [phone]: Open Monday-Friday 8am-4pm    Remember:  Do not eat after midnight the night before your surgery  You may drink clear liquids until 8:00 AM the morning of your surgery.   Clear liquids allowed are: Water, Non-Citrus Juices (without pulp), Carbonated Beverages, Clear Tea, Black Coffee Only (NO MILK, CREAM OR POWDERED CREAMER of any kind), and Gatorade.    Take these medicines the morning of surgery with A SIP OF WATER:  cetirizine (ZYRTEC)   metoprolol succinate (TOPROL-XL)   sertraline (ZOLOFT)   Take these medicines the morning of surgery AS NEEDED:  albuterol (VENTOLIN HFA) - Please bring inhaler with you to hospital  fluticasone (FLONASE)  ondansetron (ZOFRAN)   SYMBICORT - please bring with you to hospital  SUMAtriptan (IMITREX)   As of today, STOP taking any Aspirin (unless otherwise instructed by your surgeon) Aleve, Naproxen, Ibuprofen, Motrin, Advil, Goody's, BC's, all herbal medications, fish oil, and all vitamins.          Do NOT Smoke (Tobacco/Vaping) for 24 hours prior to your procedure.  If you use a CPAP at night, you may bring your mask/headgear for your overnight stay.   Contacts, glasses, piercing's, hearing aid's, dentures or partials may not be worn into surgery, please bring cases for these belongings.    For patients admitted to the hospital, discharge time will be determined by your treatment team.   Patients discharged the day of surgery will not be allowed to drive home, and someone needs to stay with them for 24 hours.  SURGICAL WAITING ROOM VISITATION Patients having surgery or a procedure may have two support people in the waiting room.  These visitors may be switched out with other visitors if needed. Children under the age of 73 must have an adult accompany them who is not the patient. If the patient needs to stay at the hospital during part of their recovery, the visitor guidelines for inpatient rooms apply.  Please refer to the [facility] website for the visitor guidelines for Inpatients (after your surgery is over and you are in a regular room).    Special instructions:   [name]- Preparing For Surgery  Before surgery, you can play an important role. Because skin is not sterile, your skin needs to be as free of germs as possible. You can reduce the number of germs on your skin by washing with CHG (chlorahexidine gluconate) Soap before surgery.  CHG is an antiseptic cleaner which kills germs and bonds with the skin to continue killing germs even after washing.    Oral Hygiene is also important to reduce your risk of infection.  Remember - BRUSH YOUR TEETH THE MORNING OF SURGERY WITH YOUR REGULAR TOOTHPASTE  Please do not use if you have an allergy to CHG or antibacterial soaps. If your skin becomes reddened/irritated stop using the CHG.  Do not shave (including legs and underarms) for at least 48 hours prior to first CHG shower. It is OK to shave your face.  Please follow these instructions carefully.   Shower the NIGHT BEFORE SURGERY and the MORNING OF SURGERY  If you chose to wash your hair, wash your hair first  as usual with your normal shampoo.  After you shampoo, rinse your hair and body thoroughly to remove the shampoo.  Use CHG Soap as you would any other liquid soap. You can apply CHG directly to the skin and wash gently with a scrungie or a clean washcloth.   Apply the CHG Soap to your body ONLY FROM THE NECK DOWN.  Do not use on open wounds or open sores. Avoid contact with your eyes, ears, mouth and genitals (private parts). Wash Face and genitals (private parts)  with your normal soap.   Wash  thoroughly, paying special attention to the area where your surgery will be performed.  Thoroughly rinse your body with warm water from the neck down.  DO NOT shower/wash with your normal soap after using and rinsing off the CHG Soap.  Pat yourself dry with a CLEAN TOWEL.  Wear CLEAN PAJAMAS to bed the night before surgery  Place CLEAN SHEETS on your bed the night before your surgery  DO NOT SLEEP WITH PETS.   Day of Surgery: Take a shower with CHG soap. Do not wear jewelry or makeup Do not wear lotions, powders, perfumes/colognes, or deodorant. Do not shave 48 hours prior to surgery.  Men may shave face and neck. Do not bring valuables to the hospital.  [facility] is not responsible for any belongings or valuables. Do not wear nail polish, gel polish, artificial nails, or any other type of covering on natural nails (fingers and toes) If you have artificial nails or gel coating that need to be removed by a nail salon, please have this removed prior to surgery. Artificial nails or gel coating may interfere with anesthesia's ability to adequately monitor your vital signs. Wear Clean/Comfortable clothing the morning of surgery Remember to brush your teeth WITH YOUR REGULAR TOOTHPASTE.   Please read over the following fact sheets that you were given.    If you received a COVID test during your pre-op visit  it is requested that you wear a mask when out in public, stay away from anyone that may not be feeling well and notify your surgeon if you develop symptoms. If you have been in contact with anyone that has tested positive in the last 10 days please notify you surgeon.

## 2021-09-24 ENCOUNTER — Encounter (HOSPITAL_COMMUNITY)
Admission: RE | Admit: 2021-09-24 | Discharge: 2021-09-24 | Disposition: A | Discharge status: Home / Self Care | Payer: Medicare HMO | Source: Ambulatory Visit | Attending: Orthopedic Surgery | Admitting: Orthopedic Surgery

## 2021-09-24 ENCOUNTER — Other Ambulatory Visit: Payer: Self-pay

## 2021-09-24 ENCOUNTER — Encounter (HOSPITAL_COMMUNITY): Payer: Self-pay

## 2021-09-24 ENCOUNTER — Telehealth: Payer: Self-pay | Admitting: Orthopedic Surgery

## 2021-09-24 VITALS — BP 152/87 | HR 70 | Temp 97.8°F | Resp 20 | Ht 62.0 in | Wt 140.9 lb

## 2021-09-24 DIAGNOSIS — J45909 Unspecified asthma, uncomplicated: Secondary | ICD-10-CM | POA: Diagnosis not present

## 2021-09-24 DIAGNOSIS — I10 Essential (primary) hypertension: Secondary | ICD-10-CM | POA: Insufficient documentation

## 2021-09-24 DIAGNOSIS — M1712 Unilateral primary osteoarthritis, left knee: Secondary | ICD-10-CM | POA: Diagnosis not present

## 2021-09-24 DIAGNOSIS — Z01818 Encounter for other preprocedural examination: Secondary | ICD-10-CM | POA: Insufficient documentation

## 2021-09-24 DIAGNOSIS — Z79899 Other long term (current) drug therapy: Secondary | ICD-10-CM | POA: Insufficient documentation

## 2021-09-24 HISTORY — DX: Unspecified asthma, uncomplicated: J45.909

## 2021-09-24 HISTORY — DX: Other complications of anesthesia, initial encounter: T88.59XA

## 2021-09-24 LAB — BASIC METABOLIC PANEL
Anion gap: 8 (ref 5–15)
BUN: 13 mg/dL (ref 8–23)
CO2: 23 mmol/L (ref 22–32)
Calcium: 9.7 mg/dL (ref 8.9–10.3)
Chloride: 111 mmol/L (ref 98–111)
Creatinine, Ser: 0.77 mg/dL (ref 0.44–1.00)
GFR, Estimated: >60 mL/min (ref >60)
Glucose, Bld: 101 mg/dL — ABNORMAL HIGH (ref 70–99)
Potassium: 4 mmol/L (ref 3.5–5.1)
Sodium: 142 mmol/L (ref 135–145)

## 2021-09-24 LAB — CBC
HCT: 39.7 % (ref 36.0–46.0)
Hemoglobin: 12.9 g/dL (ref 12.0–15.0)
MCH: 30.4 pg (ref 26.0–34.0)
MCHC: 32.5 g/dL (ref 30.0–36.0)
MCV: 93.4 fL (ref 80.0–100.0)
Platelets: 260 10*3/uL (ref 150–400)
RBC: 4.25 MIL/uL (ref 3.87–5.11)
RDW: 14.3 % (ref 11.5–15.5)
WBC: 5.2 10*3/uL (ref 4.0–10.5)
nRBC: 0 % (ref 0.0–0.2)

## 2021-09-24 LAB — URINALYSIS, COMPLETE (UACMP) WITH MICROSCOPIC
Bacteria, UA: NONE SEEN
Bilirubin Urine: NEGATIVE
Glucose, UA: NEGATIVE mg/dL
Ketones, ur: NEGATIVE mg/dL
Leukocytes,Ua: NEGATIVE
Nitrite: NEGATIVE
Protein, ur: NEGATIVE mg/dL
Specific Gravity, Urine: 1.015 (ref 1.005–1.030)
pH: 5 (ref 5.0–8.0)

## 2021-09-24 LAB — SURGICAL PCR SCREEN
MRSA, PCR: NEGATIVE
Staphylococcus aureus: NEGATIVE

## 2021-09-24 NOTE — Pre-Procedure Instructions (Signed)
Surgical Instructions    Your procedure is scheduled on October 01, 2021.  Report to [facility] Main Entrance "A" at 9:00 A.M., then check in with the Admitting office.  Call this number if you have problems the morning of surgery:  [phone]   If you have any questions prior to your surgery date call [phone]: Open Monday-Friday 8am-4pm    Remember:  Do not eat after midnight the night before your surgery  You may drink clear liquids until 8:00 AM the morning of your surgery.   Clear liquids allowed are: Water, Non-Citrus Juices (without pulp), Carbonated Beverages, Clear Tea, Black Coffee Only (NO MILK, CREAM OR POWDERED CREAMER of any kind), and Gatorade. Patient Instructions  The night before surgery:  No food after midnight. ONLY clear liquids after midnight  The day of surgery (if you do NOT have diabetes):  Drink ONE (1) Pre-Surgery Clear Ensure by 8:00am the morning of surgery. Drink in one sitting. Do not sip.  This drink was given to you during your hospital  pre-op appointment visit.  Nothing else to drink after completing the  Pre-Surgery Clear Ensure.           If you have questions, please contact your surgeon's office.    Take these medicines the morning of surgery with A SIP OF WATER:  cetirizine (ZYRTEC)   metoprolol succinate (TOPROL-XL)   sertraline (ZOLOFT)   Take these medicines the morning of surgery AS NEEDED:  albuterol (VENTOLIN HFA) - Please bring inhaler with you to hospital  fluticasone (FLONASE)  ondansetron (ZOFRAN)   SYMBICORT - please bring with you to hospital  SUMAtriptan (IMITREX)   As of today, STOP taking any Aspirin (unless otherwise instructed by your surgeon) Aleve, Naproxen, Ibuprofen, Motrin, Advil, Goody's, BC's, all herbal medications, fish oil, and all vitamins.          Do NOT Smoke (Tobacco/Vaping) for 24 hours prior to your procedure.  If you use a CPAP at night, you may bring your mask/headgear for your overnight  stay.   Contacts, glasses, piercing's, hearing aid's, dentures or partials may not be worn into surgery, please bring cases for these belongings.    For patients admitted to the hospital, discharge time will be determined by your treatment team.   Patients discharged the day of surgery will not be allowed to drive home, and someone needs to stay with them for 24 hours.  SURGICAL WAITING ROOM VISITATION Patients having surgery or a procedure may have two support people in the waiting room. These visitors may be switched out with other visitors if needed. Children under the age of 75 must have an adult accompany them who is not the patient. If the patient needs to stay at the hospital during part of their recovery, the visitor guidelines for inpatient rooms apply.  Please refer to the [facility] website for the visitor guidelines for Inpatients (after your surgery is over and you are in a regular room).    Special instructions:   [facility]- Preparing For Surgery  Before surgery, you can play an important role. Because skin is not sterile, your skin needs to be as free of germs as possible. You can reduce the number of germs on your skin by washing with CHG (chlorahexidine gluconate) Soap before surgery.  CHG is an antiseptic cleaner which kills germs and bonds with the skin to continue killing germs even after washing.    Oral Hygiene is also important to reduce your risk of infection.  Remember - BRUSH YOUR TEETH THE MORNING OF SURGERY WITH YOUR REGULAR TOOTHPASTE  Please do not use if you have an allergy to CHG or antibacterial soaps. If your skin becomes reddened/irritated stop using the CHG.  Do not shave (including legs and underarms) for at least 48 hours prior to first CHG shower. It is OK to shave your face.  Please follow these instructions carefully.   Shower the NIGHT BEFORE SURGERY and the MORNING OF SURGERY  If you chose to wash your hair, wash your hair first as usual with  your normal shampoo.  After you shampoo, rinse your hair and body thoroughly to remove the shampoo.  Use CHG Soap as you would any other liquid soap. You can apply CHG directly to the skin and wash gently with a scrungie or a clean washcloth.   Apply the CHG Soap to your body ONLY FROM THE NECK DOWN.  Do not use on open wounds or open sores. Avoid contact with your eyes, ears, mouth and genitals (private parts). Wash Face and genitals (private parts)  with your normal soap.   Wash thoroughly, paying special attention to the area where your surgery will be performed.  Thoroughly rinse your body with warm water from the neck down.  DO NOT shower/wash with your normal soap after using and rinsing off the CHG Soap.  Pat yourself dry with a CLEAN TOWEL.  Wear CLEAN PAJAMAS to bed the night before surgery  Place CLEAN SHEETS on your bed the night before your surgery  DO NOT SLEEP WITH PETS.   Day of Surgery: Take a shower with CHG soap. Do not wear jewelry or makeup Do not wear lotions, powders, perfumes/colognes, or deodorant. Do not shave 48 hours prior to surgery.  Men may shave face and neck. Do not bring valuables to the hospital.  [facility] is not responsible for any belongings or valuables. Do not wear nail polish, gel polish, artificial nails, or any other type of covering on natural nails (fingers and toes) If you have artificial nails or gel coating that need to be removed by a nail salon, please have this removed prior to surgery. Artificial nails or gel coating may interfere with anesthesia's ability to adequately monitor your vital signs. Wear Clean/Comfortable clothing the morning of surgery Remember to brush your teeth WITH YOUR REGULAR TOOTHPASTE.   Please read over the following fact sheets that you were given.    If you received a COVID test during your pre-op visit  it is requested that you wear a mask when out in public, stay away from anyone that may not be  feeling well and notify your surgeon if you develop symptoms. If you have been in contact with anyone that has tested positive in the last 10 days please notify you surgeon.

## 2021-09-24 NOTE — Progress Notes (Signed)
PCP - Aliene Beams MD Cardiologist - denies  PPM/ICD - denies Device Orders -  Rep Notified -   Chest x-ray - none EKG - 09/24/21 Stress Test - denies ECHO - denies Cardiac Cath - denies  Sleep Study - denies CPAP -   Fasting Blood Sugar - na Checks Blood Sugar _____ times a day  Blood Thinner Instructions:na Aspirin Instructions:na  ERAS Protcol - clear liquids until 0800 PRE-SURGERY Ensure or G2- Ensure  COVID TEST- na  Anesthesia review: yes-patient fall  Patient denies shortness of breath, fever, cough and chest pain at PAT appointment   All instructions explained to the patient, with a verbal understanding of the material. Patient agrees to go over the instructions while at home for a better understanding. Patient also instructed to wear a mask when out in public prior to surgery. The opportunity to ask questions was provided.

## 2021-09-24 NOTE — Telephone Encounter (Signed)
Pt called and is wondering if she will be put on blood thinners after surgery. Also wants to know if she can get a script for an ice machine for her house because she states her insurance company told her they would pay 80%

## 2021-09-24 NOTE — Telephone Encounter (Signed)
She will be put on blood thinners after surgery.  Typically that is on 1 baby aspirin twice a day.  Also we typically send patients home with the ice machine.  If she has a specific ice machine she wants to use we can write a prescription for it but we typically send him home with 1 that usually works pretty well

## 2021-09-24 NOTE — Progress Notes (Signed)
Pt fell while ambulating in hallway at PAT. She stated that she tripped on her rubber soled shoe. She denied dizziness or loss of consciousness. Pt denied pain and was able to stand and ambulate without difficulty to chair in PAT lab. VS-152/87,HR 70,R-20,O2 sat 100%,temp 97.8. Pt said she hit her right cheek and left hand on floor. No visible injury anywhere other than some redness on her left knee and hand. Pt alert and oriented,MAEx 4; full range of motion. She was seen and examined by Rica Mast NP for anesthesia. Pt declined offer to be taken to the Emergency room as she denied the need for further treatment. Pt discharged from PAT office into care of husband.

## 2021-09-25 LAB — URINE CULTURE: Culture: NO GROWTH

## 2021-09-25 NOTE — Progress Notes (Signed)
Anesthesia Consult:  Case: [id number] Date/Time: 10/01/21 1046   Procedure: LEFT TOTAL KNEE ARTHROPLASTY (Left: Knee)   Anesthesia type: Spinal   Pre-op diagnosis: left knee osteoarthritis   [facility]: [facility] / [facility]   Surgeons: Cammy Copa, MD       DISCUSSION: Pt is 71 years old with hx HTN, asthma  Called to see pt in pre-admission testing. Pt's rubber soled shoe caught on the floor causing her to fall. Fall was witnessed, did not trip. No LOC. Brunt of fall was on L wrist/hand. Full ROM L wrist and fingers; does have emerging hematoma in L thenar area that is tender, otherwise no tenderness to palp L hand/wrist. Does have superficial abrasion to L knee but gait normal and normal ROM L knee. No bleeding. Also reports hitting R cheek on ground in fall. No abrasion or laceration on face, skin is intact; no bruising. No tenderness to palp or bogginess in zygomatic bone area of R cheek.    VS: BP (!) 152/87 (BP Location: Right Arm)   Pulse 70   Temp 36.6 C (Oral)   Resp 20   Ht 5\' 2"  (1.575 m)   Wt 63.9 kg   SpO2 100%   BMI 25.77 kg/m   PROVIDERS: - PCP is , MD   LABS: Labs reviewed: Acceptable for surgery. (all labs ordered are listed, but only abnormal results are displayed)  Labs Reviewed  BASIC METABOLIC PANEL - Abnormal; Notable for the following components:      Result Value   Glucose, Bld 101 (*)    All other components within normal limits  URINALYSIS, COMPLETE (UACMP) WITH MICROSCOPIC - Abnormal; Notable for the following components:   Hgb urine dipstick SMALL (*)    All other components within normal limits  SURGICAL PCR SCREEN  URINE CULTURE  CBC    EKG 09/24/21: NSR. Nonspecific T wave abnormality. Prolonged QT   CV: N/A  Past Medical History:  Diagnosis Date   Allergy    Zyrtec, Benadryl   Anxiety    Zoloft since several years   Asthma    Balance problem    Cataract    Complication of anesthesia    GERD  (gastroesophageal reflux disease)    Hyperlipidemia    Hypertension    Memory changes    Migraines    topmax and Imitrex    Past Surgical History:  Procedure Laterality Date   ABDOMINAL HYSTERECTOMY  2016   uterine prolapse; ovaries INTACT   BLADDER REPAIR     BREAST BIOPSY     NL   BREAST EXCISIONAL BIOPSY     BREAST SURGERY     breast bx x 2 in 1980s; benign   CARPAL TUNNEL RELEASE Bilateral    CATARACT EXTRACTION, BILATERAL  2018    MEDICATIONS:  albuterol (VENTOLIN HFA) 108 (90 Base) MCG/ACT inhaler   cetirizine (ZYRTEC) 10 MG tablet   diclofenac Sodium (VOLTAREN) 1 % GEL   famotidine (PEPCID) 20 MG tablet   fluticasone (FLONASE) 50 MCG/ACT nasal spray   hydrOXYzine (VISTARIL) 50 MG capsule   metoprolol succinate (TOPROL-XL) 100 MG 24 hr tablet   ondansetron (ZOFRAN) 8 MG tablet   rosuvastatin (CRESTOR) 10 MG tablet   sertraline (ZOLOFT) 100 MG tablet   SUMAtriptan (IMITREX) 100 MG tablet   SYMBICORT 80-4.5 MCG/ACT inhaler   topiramate (TOPAMAX) 50 MG tablet   vitamin B-12 (CYANOCOBALAMIN) 500 MCG tablet   No current facility-administered medications for this encounter.  If no changes, I anticipate pt can proceed with surgery as scheduled.   Rica Mast, PhD, FNP-BC [facility]/Anesthesiology Phone: [phone] 09/25/2021 10:26 AM

## 2021-09-25 NOTE — Telephone Encounter (Signed)
IC s/w patient and advised  

## 2021-09-30 MED ORDER — TRANEXAMIC ACID 1000 MG/10ML IV SOLN
2000.0000 mg | INTRAVENOUS | Status: DC
  Filled 2021-09-30: qty 20

## 2021-10-01 ENCOUNTER — Ambulatory Visit (HOSPITAL_COMMUNITY): Payer: Medicare HMO | Admitting: Emergency Medicine

## 2021-10-01 ENCOUNTER — Encounter (HOSPITAL_COMMUNITY)
Admission: AD | Disposition: A | Discharge status: Home Health | Payer: Self-pay | Source: Ambulatory Visit | Attending: Orthopedic Surgery

## 2021-10-01 ENCOUNTER — Encounter (HOSPITAL_COMMUNITY): Payer: Self-pay | Admitting: Orthopedic Surgery

## 2021-10-01 ENCOUNTER — Inpatient Hospital Stay (HOSPITAL_COMMUNITY)
Admission: AD | Admit: 2021-10-01 | Discharge: 2021-10-07 | DRG: 469 | Disposition: A | Discharge status: Home Health | Payer: Medicare HMO | Source: Ambulatory Visit | Attending: Orthopedic Surgery | Admitting: Orthopedic Surgery

## 2021-10-01 ENCOUNTER — Other Ambulatory Visit: Payer: Self-pay

## 2021-10-01 ENCOUNTER — Ambulatory Visit (HOSPITAL_BASED_OUTPATIENT_CLINIC_OR_DEPARTMENT_OTHER): Payer: Medicare HMO | Admitting: Certified Registered Nurse Anesthetist

## 2021-10-01 DIAGNOSIS — K219 Gastro-esophageal reflux disease without esophagitis: Secondary | ICD-10-CM | POA: Diagnosis present

## 2021-10-01 DIAGNOSIS — Z83438 Family history of other disorder of lipoprotein metabolism and other lipidemia: Secondary | ICD-10-CM

## 2021-10-01 DIAGNOSIS — J449 Chronic obstructive pulmonary disease, unspecified: Secondary | ICD-10-CM | POA: Diagnosis not present

## 2021-10-01 DIAGNOSIS — F0282 Dementia in other diseases classified elsewhere, unspecified severity, with psychotic disturbance: Secondary | ICD-10-CM | POA: Diagnosis present

## 2021-10-01 DIAGNOSIS — T402X5A Adverse effect of other opioids, initial encounter: Secondary | ICD-10-CM | POA: Diagnosis not present

## 2021-10-01 DIAGNOSIS — I10 Essential (primary) hypertension: Secondary | ICD-10-CM

## 2021-10-01 DIAGNOSIS — M1711 Unilateral primary osteoarthritis, right knee: Secondary | ICD-10-CM

## 2021-10-01 DIAGNOSIS — Z7982 Long term (current) use of aspirin: Secondary | ICD-10-CM

## 2021-10-01 DIAGNOSIS — M1712 Unilateral primary osteoarthritis, left knee: Secondary | ICD-10-CM | POA: Diagnosis not present

## 2021-10-01 DIAGNOSIS — Z811 Family history of alcohol abuse and dependence: Secondary | ICD-10-CM

## 2021-10-01 DIAGNOSIS — Z87891 Personal history of nicotine dependence: Secondary | ICD-10-CM

## 2021-10-01 DIAGNOSIS — Z01818 Encounter for other preprocedural examination: Principal | ICD-10-CM

## 2021-10-01 DIAGNOSIS — Z823 Family history of stroke: Secondary | ICD-10-CM

## 2021-10-01 DIAGNOSIS — G928 Other toxic encephalopathy: Secondary | ICD-10-CM | POA: Diagnosis not present

## 2021-10-01 DIAGNOSIS — Z96652 Presence of left artificial knee joint: Secondary | ICD-10-CM

## 2021-10-01 DIAGNOSIS — F32A Depression, unspecified: Secondary | ICD-10-CM | POA: Diagnosis present

## 2021-10-01 DIAGNOSIS — Z8249 Family history of ischemic heart disease and other diseases of the circulatory system: Secondary | ICD-10-CM

## 2021-10-01 DIAGNOSIS — T426X5A Adverse effect of other antiepileptic and sedative-hypnotic drugs, initial encounter: Secondary | ICD-10-CM | POA: Diagnosis not present

## 2021-10-01 DIAGNOSIS — E7800 Pure hypercholesterolemia, unspecified: Secondary | ICD-10-CM | POA: Diagnosis present

## 2021-10-01 DIAGNOSIS — M8589 Other specified disorders of bone density and structure, multiple sites: Secondary | ICD-10-CM | POA: Diagnosis present

## 2021-10-01 DIAGNOSIS — Z808 Family history of malignant neoplasm of other organs or systems: Secondary | ICD-10-CM

## 2021-10-01 DIAGNOSIS — F418 Other specified anxiety disorders: Secondary | ICD-10-CM

## 2021-10-01 DIAGNOSIS — Z803 Family history of malignant neoplasm of breast: Secondary | ICD-10-CM

## 2021-10-01 DIAGNOSIS — G43709 Chronic migraine without aura, not intractable, without status migrainosus: Secondary | ICD-10-CM | POA: Diagnosis present

## 2021-10-01 DIAGNOSIS — F419 Anxiety disorder, unspecified: Secondary | ICD-10-CM | POA: Diagnosis present

## 2021-10-01 DIAGNOSIS — Z79899 Other long term (current) drug therapy: Secondary | ICD-10-CM

## 2021-10-01 DIAGNOSIS — F0283 Dementia in other diseases classified elsewhere, unspecified severity, with mood disturbance: Secondary | ICD-10-CM | POA: Diagnosis present

## 2021-10-01 DIAGNOSIS — Z90710 Acquired absence of both cervix and uterus: Secondary | ICD-10-CM

## 2021-10-01 DIAGNOSIS — F05 Delirium due to known physiological condition: Secondary | ICD-10-CM | POA: Diagnosis not present

## 2021-10-01 DIAGNOSIS — T43225A Adverse effect of selective serotonin reuptake inhibitors, initial encounter: Secondary | ICD-10-CM | POA: Diagnosis not present

## 2021-10-01 DIAGNOSIS — G9340 Encephalopathy, unspecified: Secondary | ICD-10-CM

## 2021-10-01 HISTORY — PX: TOTAL KNEE ARTHROPLASTY: SHX125

## 2021-10-01 SURGERY — ARTHROPLASTY, KNEE, TOTAL
Anesthesia: Spinal | Site: Knee | Laterality: Left

## 2021-10-01 MED ORDER — FENTANYL CITRATE (PF) 100 MCG/2ML IJ SOLN: 50.0000 ug | Freq: Once | INTRAMUSCULAR | Status: AC

## 2021-10-01 MED ORDER — CHLORHEXIDINE GLUCONATE 0.12 % MT SOLN
OROMUCOSAL | Status: AC
  Administered 2021-10-01: 15 mL via OROMUCOSAL
  Filled 2021-10-01: qty 15

## 2021-10-01 MED ORDER — LORATADINE 10 MG PO TABS: 10.0000 mg | ORAL_TABLET | Freq: Every day | ORAL | Status: DC | PRN

## 2021-10-01 MED ORDER — ACETAMINOPHEN 500 MG PO TABS
ORAL_TABLET | ORAL | Status: AC
  Administered 2021-10-01: 1000 mg via ORAL
  Filled 2021-10-01: qty 2

## 2021-10-01 MED ORDER — ACETAMINOPHEN 500 MG PO TABS
1000.0000 mg | ORAL_TABLET | Freq: Four times a day (QID) | ORAL | Status: AC
  Administered 2021-10-01 – 2021-10-02 (×3): 1000 mg via ORAL
  Filled 2021-10-01 (×3): qty 2

## 2021-10-01 MED ORDER — ONDANSETRON HCL 4 MG PO TABS: 4.0000 mg | ORAL_TABLET | Freq: Four times a day (QID) | ORAL | Status: DC | PRN

## 2021-10-01 MED ORDER — MORPHINE SULFATE 4 MG/ML IJ SOLN
INTRAMUSCULAR | Status: DC | PRN
  Administered 2021-10-01: 13 mL

## 2021-10-01 MED ORDER — HYDROMORPHONE HCL 2 MG PO TABS
1.0000 mg | ORAL_TABLET | ORAL | Status: DC | PRN
  Administered 2021-10-01: 1 mg via ORAL
  Filled 2021-10-01: qty 1

## 2021-10-01 MED ORDER — DOCUSATE SODIUM 100 MG PO CAPS
100.0000 mg | ORAL_CAPSULE | Freq: Two times a day (BID) | ORAL | Status: DC
Start: 2021-10-01 — End: 2021-10-07
  Administered 2021-10-01 – 2021-10-07 (×12): 100 mg via ORAL
  Filled 2021-10-01 (×12): qty 1

## 2021-10-01 MED ORDER — CEFAZOLIN SODIUM-DEXTROSE 1-4 GM/50ML-% IV SOLN
1.0000 g | Freq: Three times a day (TID) | INTRAVENOUS | Status: AC
  Administered 2021-10-01 – 2021-10-02 (×2): 1 g via INTRAVENOUS
  Filled 2021-10-01 (×2): qty 50

## 2021-10-01 MED ORDER — HYDROMORPHONE HCL 1 MG/ML IJ SOLN: 0.2500 mg | INTRAMUSCULAR | Status: DC | PRN

## 2021-10-01 MED ORDER — CELECOXIB 100 MG PO CAPS
100.0000 mg | ORAL_CAPSULE | Freq: Two times a day (BID) | ORAL | Status: DC
Start: 2021-10-01 — End: 2021-10-07
  Administered 2021-10-01 – 2021-10-07 (×12): 100 mg via ORAL
  Filled 2021-10-01 (×14): qty 1

## 2021-10-01 MED ORDER — PROPOFOL 10 MG/ML IV BOLUS
INTRAVENOUS | Status: DC | PRN
  Administered 2021-10-01: 10 mg via INTRAVENOUS
  Administered 2021-10-01: 20 mg via INTRAVENOUS

## 2021-10-01 MED ORDER — VANCOMYCIN HCL 1000 MG IV SOLR
INTRAVENOUS | Status: DC | PRN
  Administered 2021-10-01: 1000 mg via TOPICAL

## 2021-10-01 MED ORDER — ONDANSETRON HCL 4 MG/2ML IJ SOLN
INTRAMUSCULAR | Status: DC | PRN
  Administered 2021-10-01: 4 mg via INTRAVENOUS

## 2021-10-01 MED ORDER — ONDANSETRON HCL 4 MG/2ML IJ SOLN
INTRAMUSCULAR | Status: AC
  Filled 2021-10-01: qty 2

## 2021-10-01 MED ORDER — MEPERIDINE HCL 25 MG/ML IJ SOLN: 6.2500 mg | INTRAMUSCULAR | Status: DC | PRN

## 2021-10-01 MED ORDER — TOPIRAMATE 25 MG PO TABS
50.0000 mg | ORAL_TABLET | Freq: Two times a day (BID) | ORAL | Status: DC
  Administered 2021-10-01 – 2021-10-05 (×9): 50 mg via ORAL
  Filled 2021-10-01 (×10): qty 2

## 2021-10-01 MED ORDER — MENTHOL 3 MG MT LOZG: 1.0000 | LOZENGE | OROMUCOSAL | Status: DC | PRN

## 2021-10-01 MED ORDER — ALBUTEROL SULFATE (2.5 MG/3ML) 0.083% IN NEBU: 2.5000 mg | INHALATION_SOLUTION | Freq: Four times a day (QID) | RESPIRATORY_TRACT | Status: DC | PRN

## 2021-10-01 MED ORDER — ORAL CARE MOUTH RINSE: 15.0000 mL | Freq: Once | OROMUCOSAL | Status: AC

## 2021-10-01 MED ORDER — SODIUM CHLORIDE (PF) 0.9 % IJ SOLN
INTRAMUSCULAR | Status: DC | PRN
  Administered 2021-10-01: 60 mL

## 2021-10-01 MED ORDER — TRANEXAMIC ACID 1000 MG/10ML IV SOLN
INTRAVENOUS | Status: DC | PRN
  Administered 2021-10-01: 2000 mg via TOPICAL

## 2021-10-01 MED ORDER — CHLORHEXIDINE GLUCONATE 0.12 % MT SOLN
15.0000 mL | Freq: Once | OROMUCOSAL | Status: AC
Start: 2021-10-01 — End: 2021-10-01

## 2021-10-01 MED ORDER — VANCOMYCIN HCL 1000 MG IV SOLR
INTRAVENOUS | Status: AC
Start: 2021-10-01 — End: ?
  Filled 2021-10-01: qty 20

## 2021-10-01 MED ORDER — MIDAZOLAM HCL 2 MG/2ML IJ SOLN: 0.5000 mg | Freq: Once | INTRAMUSCULAR | Status: DC | PRN

## 2021-10-01 MED ORDER — MIDAZOLAM HCL 2 MG/2ML IJ SOLN
INTRAMUSCULAR | Status: AC
  Administered 2021-10-01: 1 mg via INTRAVENOUS
  Filled 2021-10-01: qty 2

## 2021-10-01 MED ORDER — MORPHINE SULFATE (PF) 4 MG/ML IV SOLN
INTRAVENOUS | Status: AC
  Filled 2021-10-01: qty 2

## 2021-10-01 MED ORDER — METOPROLOL SUCCINATE ER 100 MG PO TB24
100.0000 mg | ORAL_TABLET | Freq: Every day | ORAL | Status: DC
  Administered 2021-10-03 – 2021-10-07 (×5): 100 mg via ORAL
  Filled 2021-10-01 (×6): qty 1

## 2021-10-01 MED ORDER — SODIUM CHLORIDE 0.9 % IR SOLN
Status: DC | PRN
  Administered 2021-10-01: 3000 mL

## 2021-10-01 MED ORDER — FAMOTIDINE 20 MG PO TABS
20.0000 mg | ORAL_TABLET | Freq: Two times a day (BID) | ORAL | Status: DC
  Administered 2021-10-02 – 2021-10-07 (×11): 20 mg via ORAL
  Filled 2021-10-01 (×11): qty 1

## 2021-10-01 MED ORDER — POVIDONE-IODINE 10 % EX SWAB: 2.0000 | Freq: Once | CUTANEOUS | Status: DC

## 2021-10-01 MED ORDER — HYDROMORPHONE HCL 1 MG/ML IJ SOLN
0.5000 mg | INTRAMUSCULAR | Status: DC | PRN
  Administered 2021-10-01 – 2021-10-02 (×2): 1 mg via INTRAVENOUS
  Filled 2021-10-01 (×2): qty 1

## 2021-10-01 MED ORDER — PROPOFOL 500 MG/50ML IV EMUL
INTRAVENOUS | Status: DC | PRN
  Administered 2021-10-01: 40 ug/kg/min via INTRAVENOUS

## 2021-10-01 MED ORDER — SERTRALINE HCL 100 MG PO TABS
200.0000 mg | ORAL_TABLET | Freq: Every day | ORAL | Status: DC
  Administered 2021-10-02 – 2021-10-04 (×3): 200 mg via ORAL
  Filled 2021-10-01 (×3): qty 2

## 2021-10-01 MED ORDER — POVIDONE-IODINE 10 % EX SWAB
2.0000 | Freq: Once | CUTANEOUS | Status: AC
  Administered 2021-10-01: 2 via TOPICAL

## 2021-10-01 MED ORDER — ACETAMINOPHEN 500 MG PO TABS: 1000.0000 mg | ORAL_TABLET | Freq: Once | ORAL | Status: AC

## 2021-10-01 MED ORDER — ACETAMINOPHEN 325 MG PO TABS
325.0000 mg | ORAL_TABLET | Freq: Four times a day (QID) | ORAL | Status: DC | PRN
  Administered 2021-10-05 – 2021-10-07 (×4): 650 mg via ORAL
  Filled 2021-10-01 (×5): qty 2

## 2021-10-01 MED ORDER — ROPIVACAINE HCL 7.5 MG/ML IJ SOLN
INTRAMUSCULAR | Status: DC | PRN
  Administered 2021-10-01: 20 mL via PERINEURAL

## 2021-10-01 MED ORDER — IRRISEPT - 450ML BOTTLE WITH 0.05% CHG IN STERILE WATER, USP 99.95% OPTIME
TOPICAL | Status: DC | PRN
  Administered 2021-10-01: 450 mL

## 2021-10-01 MED ORDER — PROMETHAZINE HCL 25 MG/ML IJ SOLN: 6.2500 mg | INTRAMUSCULAR | Status: DC | PRN

## 2021-10-01 MED ORDER — BUPIVACAINE IN DEXTROSE 0.75-8.25 % IT SOLN
INTRATHECAL | Status: DC | PRN
  Administered 2021-10-01: 12 mg via INTRATHECAL

## 2021-10-01 MED ORDER — METOCLOPRAMIDE HCL 5 MG PO TABS: 5.0000 mg | ORAL_TABLET | Freq: Three times a day (TID) | ORAL | Status: DC | PRN

## 2021-10-01 MED ORDER — METHOCARBAMOL 500 MG PO TABS
500.0000 mg | ORAL_TABLET | Freq: Four times a day (QID) | ORAL | Status: DC | PRN
  Administered 2021-10-01 – 2021-10-02 (×3): 500 mg via ORAL
  Filled 2021-10-01 (×3): qty 1

## 2021-10-01 MED ORDER — CEFAZOLIN SODIUM-DEXTROSE 2-4 GM/100ML-% IV SOLN
2.0000 g | INTRAVENOUS | Status: AC
  Administered 2021-10-01: 2 g via INTRAVENOUS
  Filled 2021-10-01: qty 100

## 2021-10-01 MED ORDER — BUPIVACAINE LIPOSOME 1.3 % IJ SUSP
INTRAMUSCULAR | Status: AC
  Filled 2021-10-01: qty 20

## 2021-10-01 MED ORDER — LACTATED RINGERS IV SOLN
INTRAVENOUS | Status: DC
Start: 2021-10-01 — End: 2021-10-01

## 2021-10-01 MED ORDER — SODIUM CHLORIDE 0.9 % IV SOLN: INTRAVENOUS | Status: DC

## 2021-10-01 MED ORDER — PHENYLEPHRINE HCL-NACL 20-0.9 MG/250ML-% IV SOLN
INTRAVENOUS | Status: DC | PRN
  Administered 2021-10-01: 10 ug/min via INTRAVENOUS

## 2021-10-01 MED ORDER — 0.9 % SODIUM CHLORIDE (POUR BTL) OPTIME
TOPICAL | Status: DC | PRN
  Administered 2021-10-01: 1000 mL

## 2021-10-01 MED ORDER — METOCLOPRAMIDE HCL 5 MG/ML IJ SOLN: 5.0000 mg | Freq: Three times a day (TID) | INTRAMUSCULAR | Status: DC | PRN

## 2021-10-01 MED ORDER — PHENOL 1.4 % MT LIQD: 1.0000 | OROMUCOSAL | Status: DC | PRN

## 2021-10-01 MED ORDER — ONDANSETRON HCL 4 MG/2ML IJ SOLN: 4.0000 mg | Freq: Four times a day (QID) | INTRAMUSCULAR | Status: DC | PRN

## 2021-10-01 MED ORDER — TRANEXAMIC ACID-NACL 1000-0.7 MG/100ML-% IV SOLN
1000.0000 mg | INTRAVENOUS | Status: AC
  Administered 2021-10-01: 1000 mg via INTRAVENOUS
  Filled 2021-10-01: qty 100

## 2021-10-01 MED ORDER — METHOCARBAMOL 1000 MG/10ML IJ SOLN: 500.0000 mg | Freq: Four times a day (QID) | INTRAVENOUS | Status: DC | PRN

## 2021-10-01 MED ORDER — POVIDONE-IODINE 7.5 % EX SOLN: Freq: Once | CUTANEOUS | Status: DC

## 2021-10-01 MED ORDER — FENTANYL CITRATE (PF) 100 MCG/2ML IJ SOLN
INTRAMUSCULAR | Status: AC
  Administered 2021-10-01: 50 ug via INTRAVENOUS
  Filled 2021-10-01: qty 2

## 2021-10-01 MED ORDER — ASPIRIN 81 MG PO CHEW
81.0000 mg | CHEWABLE_TABLET | Freq: Two times a day (BID) | ORAL | Status: DC
Start: 2021-10-01 — End: 2021-10-07
  Administered 2021-10-01 – 2021-10-07 (×12): 81 mg via ORAL
  Filled 2021-10-01 (×12): qty 1

## 2021-10-01 MED ORDER — BUPIVACAINE HCL (PF) 0.25 % IJ SOLN
INTRAMUSCULAR | Status: AC
Start: 2021-10-01 — End: ?
  Filled 2021-10-01: qty 30

## 2021-10-01 MED ORDER — MIDAZOLAM HCL 2 MG/2ML IJ SOLN: 1.0000 mg | Freq: Once | INTRAMUSCULAR | Status: AC

## 2021-10-01 SURGICAL SUPPLY — 86 items
BAG COUNTER SPONGE SURGICOUNT (BAG) ×2 IMPLANT
BAG DECANTER FOR FLEXI CONT (MISCELLANEOUS) ×2 IMPLANT
BANDAGE ESMARK 6X9 LF (GAUZE/BANDAGES/DRESSINGS) ×1 IMPLANT
BLADE SAG 18X100X1.27 (BLADE) ×2 IMPLANT
BLADE SAGITTAL (BLADE) ×1
BLADE SAW THK.89X75X18XSGTL (BLADE) ×1 IMPLANT
BNDG COHESIVE 6X5 TAN STRL LF (GAUZE/BANDAGES/DRESSINGS) ×2 IMPLANT
BNDG ELASTIC 6X15 VLCR STRL LF (GAUZE/BANDAGES/DRESSINGS) ×2 IMPLANT
BNDG ESMARK 6X9 LF (GAUZE/BANDAGES/DRESSINGS) ×2
BOWL SMART MIX CTS (DISPOSABLE) ×1 IMPLANT
CEMENT BONE SIMPLEX SPEEDSET (Cement) ×2 IMPLANT
CNTNR URN SCR LID CUP LEK RST (MISCELLANEOUS) ×1 IMPLANT
COMP FEM CEMT PERS SZ 7 LT (Joint) ×2 IMPLANT
COMP PATELLA 32 STD 8.5 THK (Orthopedic Implant) IMPLANT
COMP PSN MC ASF L 13 6-7/CD (Miscellaneous) ×2 IMPLANT
COMPONENT FEM CMT PERS SZ7 LT (Joint) IMPLANT
COMPONENT PSN ASF L 13 6-7/CD (Miscellaneous) IMPLANT
CONT SPEC 4OZ STRL OR WHT (MISCELLANEOUS) ×1
COOLER ICEMAN CLASSIC (MISCELLANEOUS) ×1 IMPLANT
COVER SURGICAL LIGHT HANDLE (MISCELLANEOUS) ×2 IMPLANT
CUFF TOURN SGL QUICK 34 (TOURNIQUET CUFF) ×1
CUFF TOURN SGL QUICK 42 (TOURNIQUET CUFF) IMPLANT
CUFF TRNQT CYL 34X4.125X (TOURNIQUET CUFF) ×1 IMPLANT
DRAPE INCISE IOBAN 66X45 STRL (DRAPES) IMPLANT
DRAPE ORTHO SPLIT 77X108 STRL (DRAPES) ×3
DRAPE SURG ORHT 6 SPLT 77X108 (DRAPES) ×3 IMPLANT
DRAPE U-SHAPE 47X51 STRL (DRAPES) ×2 IMPLANT
DRSG AQUACEL AG ADV 3.5X14 (GAUZE/BANDAGES/DRESSINGS) ×1 IMPLANT
DURAPREP 26ML APPLICATOR (WOUND CARE) ×4 IMPLANT
ELECT CAUTERY BLADE 6.4 (BLADE) ×2 IMPLANT
ELECT REM PT RETURN 9FT ADLT (ELECTROSURGICAL) ×2
ELECTRODE REM PT RTRN 9FT ADLT (ELECTROSURGICAL) ×1 IMPLANT
GAUZE SPONGE 4X4 12PLY STRL (GAUZE/BANDAGES/DRESSINGS) ×1 IMPLANT
GLOVE BIOGEL PI IND STRL 7.0 (GLOVE) ×1 IMPLANT
GLOVE BIOGEL PI IND STRL 8 (GLOVE) ×1 IMPLANT
GLOVE BIOGEL PI INDICATOR 7.0 (GLOVE) ×1
GLOVE BIOGEL PI INDICATOR 8 (GLOVE) ×1
GLOVE ECLIPSE 7.0 STRL STRAW (GLOVE) ×2 IMPLANT
GLOVE ECLIPSE 8.0 STRL XLNG CF (GLOVE) ×2 IMPLANT
GLOVE SURG ENC MOIS LTX SZ6.5 (GLOVE) ×6 IMPLANT
GOWN STRL REUS W/ TWL LRG LVL3 (GOWN DISPOSABLE) ×3 IMPLANT
GOWN STRL REUS W/TWL LRG LVL3 (GOWN DISPOSABLE) ×4
HANDPIECE INTERPULSE COAX TIP (DISPOSABLE) ×1
HDLS TROCR DRIL PIN KNEE 75 (PIN) ×1
HOOD PEEL AWAY FLYTE STAYCOOL (MISCELLANEOUS) ×7 IMPLANT
IMMOBILIZER KNEE 20 (SOFTGOODS)
IMMOBILIZER KNEE 20 THIGH 36 (SOFTGOODS) IMPLANT
IMMOBILIZER KNEE 22 UNIV (SOFTGOODS) ×1 IMPLANT
IMMOBILIZER KNEE 24 THIGH 36 (MISCELLANEOUS) IMPLANT
IMMOBILIZER KNEE 24 UNIV (MISCELLANEOUS)
KIT BASIN OR (CUSTOM PROCEDURE TRAY) ×2 IMPLANT
KIT TURNOVER KIT B (KITS) ×2 IMPLANT
MANIFOLD NEPTUNE II (INSTRUMENTS) ×2 IMPLANT
NDL SPNL 18GX3.5 QUINCKE PK (NEEDLE) ×1 IMPLANT
NEEDLE 22X1 1/2 (OR ONLY) (NEEDLE) ×4 IMPLANT
NEEDLE SPNL 18GX3.5 QUINCKE PK (NEEDLE) ×2 IMPLANT
NS IRRIG 1000ML POUR BTL (IV SOLUTION) ×4 IMPLANT
PACK TOTAL JOINT (CUSTOM PROCEDURE TRAY) ×2 IMPLANT
PAD ARMBOARD 7.5X6 YLW CONV (MISCELLANEOUS) ×4 IMPLANT
PAD CAST 4YDX4 CTTN HI CHSV (CAST SUPPLIES) ×1 IMPLANT
PAD COLD SHLDR WRAP-ON (PAD) ×1 IMPLANT
PADDING CAST COTTON 4X4 STRL (CAST SUPPLIES)
PADDING CAST COTTON 6X4 STRL (CAST SUPPLIES) ×1 IMPLANT
PATELLA ZIMMER 32MM (Orthopedic Implant) ×2 IMPLANT
PIN DRILL HDLS TROCAR 75 4PK (PIN) IMPLANT
SCREW FEMALE HEX FIX 25X2.5 (ORTHOPEDIC DISPOSABLE SUPPLIES) ×1 IMPLANT
SET HNDPC FAN SPRY TIP SCT (DISPOSABLE) ×1 IMPLANT
SPIKE FLUID TRANSFER (MISCELLANEOUS) ×2 IMPLANT
STEM TIBIA 5 DEG SZ D L KNEE (Knees) IMPLANT
STRIP CLOSURE SKIN 1/2X4 (GAUZE/BANDAGES/DRESSINGS) ×4 IMPLANT
SUCTION FRAZIER HANDLE 10FR (MISCELLANEOUS) ×1
SUCTION TUBE FRAZIER 10FR DISP (MISCELLANEOUS) ×1 IMPLANT
SUT MNCRL AB 3-0 PS2 18 (SUTURE) ×2 IMPLANT
SUT VIC AB 0 CT1 27 (SUTURE) ×3
SUT VIC AB 0 CT1 27XBRD ANBCTR (SUTURE) ×3 IMPLANT
SUT VIC AB 1 CT1 36 (SUTURE) ×10 IMPLANT
SUT VIC AB 2-0 CT1 27 (SUTURE) ×4
SUT VIC AB 2-0 CT1 TAPERPNT 27 (SUTURE) ×4 IMPLANT
SYR 30ML LL (SYRINGE) ×6 IMPLANT
SYR TB 1ML LUER SLIP (SYRINGE) ×2 IMPLANT
TIBIA STEM 5 DEG SZ D L KNEE (Knees) ×2 IMPLANT
TOWEL GREEN STERILE (TOWEL DISPOSABLE) ×4 IMPLANT
TOWEL GREEN STERILE FF (TOWEL DISPOSABLE) ×4 IMPLANT
TRAY CATH 16FR W/PLASTIC CATH (SET/KITS/TRAYS/PACK) IMPLANT
WATER STERILE IRR 1000ML POUR (IV SOLUTION) IMPLANT
YANKAUER SUCT BULB TIP NO VENT (SUCTIONS) ×2 IMPLANT

## 2021-10-01 NOTE — Progress Notes (Signed)
Orthopedic Tech Progress Note Patient Details:  Caroline [name] 30-May-1950 [id number]  CPM Left Knee CPM Left Knee: On Left Knee Flexion (Degrees): 10 Left Knee Extension (Degrees): 40  Post Interventions Patient Tolerated: Well Instructions Provided: Care of device Ortho Devices Type of Ortho Device: Bone foam zero knee Ortho Device/Splint Interventions: Ordered   Post Interventions Patient Tolerated: Well Instructions Provided: Care of device  Donald Pore 10/01/2021, 3:28 PM

## 2021-10-01 NOTE — Plan of Care (Signed)
  Problem: Education: Goal: Knowledge of General Education information will improve Description: Including pain rating scale, medication(s)/side effects and non-pharmacologic comfort measures Outcome: Not Progressing   Problem: Health Behavior/Discharge Planning: Goal: Ability to manage health-related needs will improve Outcome: Not Progressing   Problem: Clinical Measurements: Goal: Ability to maintain clinical measurements within normal limits will improve Outcome: Not Progressing Goal: Will remain free from infection Outcome: Not Progressing Goal: Diagnostic test results will improve Outcome: Not Progressing Goal: Respiratory complications will improve Outcome: Not Progressing Goal: Cardiovascular complication will be avoided Outcome: Not Progressing   Problem: Nutrition: Goal: Adequate nutrition will be maintained Outcome: Not Progressing   Problem: Coping: Goal: Level of anxiety will decrease Outcome: Not Progressing   Problem: Elimination: Goal: Will not experience complications related to bowel motility Outcome: Not Progressing Goal: Will not experience complications related to urinary retention Outcome: Not Progressing   

## 2021-10-01 NOTE — Anesthesia Postprocedure Evaluation (Signed)
Anesthesia Post Note  Patient: Caroline [name]  Procedure(s) Performed: LEFT TOTAL KNEE ARTHROPLASTY (Left: Knee)     Patient location during evaluation: PACU Anesthesia Type: Spinal Level of consciousness: awake and alert, patient cooperative and oriented Pain management: pain level controlled Vital Signs Assessment: post-procedure vital signs reviewed and stable Respiratory status: spontaneous breathing, nonlabored ventilation and respiratory function stable Cardiovascular status: blood pressure returned to baseline and stable Postop Assessment: no apparent nausea or vomiting and spinal receding Anesthetic complications: no   No notable events documented.  Last Vitals:  Vitals:   10/01/21 1445 10/01/21 1500  BP:  126/74  Pulse: (!) 56 (!) 57  Resp: 15 15  Temp:    SpO2: 99% 96%    Last Pain:  Vitals:   10/01/21 1500  TempSrc:   PainSc: 0-No pain                 Forrester Blando,E. Taylar Hartsough

## 2021-10-01 NOTE — Anesthesia Procedure Notes (Signed)
Anesthesia Regional Block: Adductor canal block   Pre-Anesthetic Checklist: , timeout performed,  Correct Patient, Correct Site, Correct Laterality,  Correct Procedure, Correct Position, site marked,  Risks and benefits discussed,  Surgical consent,  Pre-op evaluation,  At surgeon's request and post-op pain management  Laterality: Left and Lower  Prep: chloraprep       Needles:  Injection technique: Single-shot  Needle Type: Echogenic Needle     Needle Length: 9cm  Needle Gauge: 21     Additional Needles:   Procedures:,,,, ultrasound used (permanent image in chart),,    Narrative:  Start time: 10/01/2021 9:56 AM End time: 10/01/2021 10:02 AM Injection made incrementally with aspirations every 5 mL.  Performed by: Personally  Anesthesiologist: Jairo Ben, MD  Additional Notes: Pt identified in Holding room.  Monitors applied. Working IV access confirmed. Sterile prep L thigh.  #21ga ECHOgenic Arrow block needle into adductor canal with US guidance.  20cc 0.75% Ropivacaine injected incrementally after negative test dose.  Patient asymptomatic, VSS, no heme aspirated, tolerated well.   Sandford Craze, MD

## 2021-10-01 NOTE — Anesthesia Procedure Notes (Addendum)
Spinal  Patient location during procedure: OR End time: 10/01/2021 11:34 AM Reason for block: surgical anesthesia Staffing Performed: anesthesiologist  Anesthesiologist: Jairo Ben, MD Performed by: Jairo Ben, MD Authorized by: Jairo Ben, MD   Preanesthetic Checklist Completed: patient identified, IV checked, site marked, risks and benefits discussed, surgical consent, monitors and equipment checked, pre-op evaluation and timeout performed Spinal Block Patient position: sitting Prep: DuraPrep and site prepped and draped Patient monitoring: blood pressure, continuous pulse ox, cardiac monitor and heart rate Approach: midline Location: L3-4 Injection technique: single-shot Needle Needle type: Pencan and Introducer  Needle gauge: 24 G Needle length: 9 cm Assessment Events: CSF return Additional Notes Pt identified in Operating room.  Monitors applied. Working IV access confirmed. Sterile prep, drape lumbar spine.  1% lido local L 3,4.  #24ga Pencan into clear CSF L 3,4.  12mg  0.75% Bupivacaine with dextrose injected with asp CSF beginning and end of injection.  Patient asymptomatic, VSS, no heme aspirated, tolerated well.  Sandford Craze, MD

## 2021-10-01 NOTE — Brief Op Note (Signed)
   10/01/2021  2:10 PM  PATIENT:  Contessa Ursin  71 y.o. female  PRE-OPERATIVE DIAGNOSIS:  left knee osteoarthritis  POST-OPERATIVE DIAGNOSIS:  left knee osteoarthritis  PROCEDURE:  Procedure(s): LEFT TOTAL KNEE ARTHROPLASTY  SURGEON:  Surgeon(s): Cammy Copa, MD  ASSISTANT: magnant pa  ANESTHESIA:   spinal  EBL: 50 ml    Total I/O In: 100 [IV Piggyback:100] Out: 505 [Urine:475; Blood:30]  BLOOD ADMINISTERED: none  DRAINS: none   LOCAL MEDICATIONS USED: Marcaine morphine clonidine Exparel vancomycin powder  SPECIMEN:  No Specimen  COUNTS:  YES  TOURNIQUET:   Total Tourniquet Time Documented: Thigh (Left) - 68 minutes Total: Thigh (Left) - 68 minutes   DICTATION: .[id number]  PLAN OF CARE: Admit for overnight observation  PATIENT DISPOSITION:  PACU - hemodynamically stable

## 2021-10-02 ENCOUNTER — Encounter (HOSPITAL_COMMUNITY): Payer: Self-pay | Admitting: Orthopedic Surgery

## 2021-10-02 MED ORDER — HYDROMORPHONE HCL 2 MG PO TABS
1.0000 mg | ORAL_TABLET | ORAL | Status: DC | PRN
  Administered 2021-10-02: 1 mg via ORAL
  Filled 2021-10-02: qty 1

## 2021-10-02 MED ORDER — HYDROMORPHONE HCL 1 MG/ML IJ SOLN
0.5000 mg | INTRAMUSCULAR | Status: DC | PRN
  Filled 2021-10-02: qty 0.5

## 2021-10-02 MED ORDER — ACETAMINOPHEN 325 MG PO TABS: 325.0000 mg | ORAL_TABLET | Freq: Four times a day (QID) | ORAL | 0 refills | Status: AC | PRN

## 2021-10-02 MED ORDER — HYDROMORPHONE HCL 2 MG PO TABS: 1.0000 mg | ORAL_TABLET | ORAL | 0 refills | Status: DC | PRN

## 2021-10-02 MED ORDER — HYDROMORPHONE HCL 2 MG PO TABS
1.0000 mg | ORAL_TABLET | ORAL | Status: DC | PRN
  Administered 2021-10-02 (×2): 1 mg via ORAL
  Filled 2021-10-02 (×2): qty 1

## 2021-10-02 MED ORDER — CELECOXIB 100 MG PO CAPS: 100.0000 mg | ORAL_CAPSULE | Freq: Two times a day (BID) | ORAL | 0 refills | Status: DC

## 2021-10-02 MED ORDER — HYDROMORPHONE HCL 1 MG/ML IJ SOLN: 0.5000 mg | INTRAMUSCULAR | Status: DC | PRN

## 2021-10-02 MED ORDER — ASPIRIN 81 MG PO CHEW: 81.0000 mg | CHEWABLE_TABLET | Freq: Two times a day (BID) | ORAL | 0 refills | Status: DC

## 2021-10-02 NOTE — Plan of Care (Signed)

## 2021-10-02 NOTE — Op Note (Signed)
Caroline [name], [name] MEDICAL RECORD NO: [id number] ACCOUNT NO: [id number] DATE OF BIRTH: [birth year] FACILITY: [facility] LOCATION: [facility] PHYSICIAN: Graylin Shiver. August Saucer, MD  Operative Report   DATE OF PROCEDURE: 10/01/2021  PREOPERATIVE DIAGNOSIS:  Left knee arthritis.  POSTOPERATIVE DIAGNOSIS:  Left knee arthritis.  PROCEDURE:  Left total knee replacement utilizing Persona components including size 7 cruciate retaining cemented left femur narrow with natural tibia 5-degree left size D, 32 mm all poly patella and a 13 mm medial congruent highly cross-linked  polyethylene bearing.  SURGEON:  Cammy Copa, MD  ASSISTANT:  Karenann Cai, PA.  INDICATIONS:  This is a 71 year old patient with left knee arthritis, who presents for operative management after explanation of risks and benefits.  DESCRIPTION OF PROCEDURE:  The patient was brought to the operating room where spinal anesthetic was induced.  Preoperative antibiotics administered.  Timeout was called.  Left leg was prescrubbed with alcohol and Betadine, allowed to air dry.  Prepped  with DuraPrep solution and draped in a sterile manner.  Ioban used to cover the operative field.  The leg was elevated and exsanguinated with Esmarch wrap.  Tourniquet was inflated.  Timeout was called.  Anterior approach to the knee was made.  Skin and  subcutaneous tissue sharply divided.  Irrisept solution utilized at this time.  Median parapatellar arthrotomy was made and marked with a #1 Vicryl suture.  Irrisept solution also utilized after the arthrotomy. Patella was everted.  Fat pad partially  excised.  Lateral patellofemoral ligament was released.  Soft tissue removed from the anterior distal femur.  Patella was then everted.  Anterior horn lateral meniscus resected.  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  The patient had severe arthritis in the medial  compartment and in the patellofemoral compartment.  Intramedullary alignment was then used to make a cut 10 mm off  the least affected lateral tibial plateau.  This was perpendicular to the mechanical axis in approximately 7 degrees of slope posteriorly.   The intramedullary alignment was then used to make a cut on the distal femur, 5 degrees valgus.  This was a 10 mm cut.  An 11 mm spacer fit well within the extension space with good stability and good alignment.  Medial soft tissue dissection was  performed as part of the exposure proportional to the amount of preoperative varus deformity that the patient had.  Next, the anterior posterior and chamfer cuts were then made in 3 degrees of external rotation, which gave a perpendicular flexion gap.   The tibia was then placed in appropriate rotation aligned with the medial third of the tibial tubercle.  Trial femur was placed.  Patella was then cut down approximately 9-10 mm.  Then, a trial patellar button was placed.  With the trial patellar button  in position, the patient had nice alignment with an 11 mm spacer.  13 spacer gave a less hyperextension, but very good stability to varus and valgus stress at 0, 30 and 90 degrees, The 13 mm spacer was chosen.  No lift-off with flexion with that spacer.   The trial components were removed.  It should be noted that we did finish preparation on the tibia prior to removing the baseplate.  Thorough irrigation was then performed with 3 liters of pulsatile irrigation.  Next, the capsule was anesthetized using  a combination of Marcaine, Exparel and saline.  Then, TXA sponge and Irrisept solution was used to set in the knee for 3 minutes.  This was removed.  Vancomycin powder and  bone plugs were placed in the tibial and femoral canal. The tibia and femur were  then dried and the prosthesis were cemented into position.  Same stability parameters were maintained after excess cement was removed and cement hardening occurred.  The 13 mm spacer gave optimal stability with good full extension.  Patellar tracking  improved with small lateral  release.  Tourniquet released at this time.  Bleeding points encountered controlled using electrocautery.  Thorough irrigation with pouring irrigation performed and then the arthrotomy was closed over a bolster using #1 Vicryl  suture.  Prior to final closure, the knee was again irrigated with Irrisept and vancomycin powder was then placed.  The arthrotomy closed using #1 Vicryl suture and then the knee was injected with Marcaine, morphine, clonidine.  Thorough irrigation  again performed and vancomycin powder placed on top of the arthrotomy, which was then closed using 0 Vicryl suture, 2-0 Vicryl suture, and 3-0 Monocryl with Steri-Strips and Aquacel dressing applied.  The patient tolerated the procedure well without  immediate complication.  Ace wrap, IceMan, knee immobilizer placed.  Luke's assistance was required at all times for opening, closing, retraction, tissue exposure of the femur and tibia for cutting. He was also required for sawing and placing the pins. His assistance was required at all times during the case.   PAA D: 10/01/2021 2:17:07 pm T: 10/01/2021 9:51:00 pm  JOB: [id number]/ [id number]

## 2021-10-02 NOTE — Progress Notes (Signed)
Physical Therapy Treatment Patient Details Name: Caroline [name] MRN: [id number] DOB: 07/12/1950 Today's Date: 10/02/2021   History of Present Illness Pt is a 71yo F s/p L TKA on 6/27. PMH: HTN, osteopenia, asthma    PT Comments    Pt noted to be groggy and in increased amount of pain this session. Requiring modA for transfers and ambulated 18ft to the bathroom with minA. Pt appearing pale and diaphoretic with slow verbal responses so therapist returned pt to recliner. BP measured at 98/70 (80), RN notified. Will follow up with pt this afternoon to progress ambulation for safe discharge home.    Recommendations for follow up therapy are one component of a multi-disciplinary discharge planning process, led by the attending physician.  Recommendations may be updated based on patient status, additional functional criteria and insurance authorization.  Follow Up Recommendations  Follow physician's recommendations for discharge plan and follow up therapies     Assistance Recommended at Discharge PRN  Patient can return home with the following A little help with walking and/or transfers;A little help with bathing/dressing/bathroom;Assistance with cooking/housework;Assist for transportation;Help with stairs or ramp for entrance   Equipment Recommendations  None recommended by PT    Recommendations for Other Services       Precautions / Restrictions Precautions Precautions: Fall;Knee Precaution Booklet Issued: Yes (comment) Precaution Comments: no pillow under knee Required Braces or Orthoses: Knee Immobilizer - Left Knee Immobilizer - Left: Discontinue once straight leg raise with < 10 degree lag Restrictions Weight Bearing Restrictions: Yes LLE Weight Bearing: Weight bearing as tolerated     Mobility  Bed Mobility Overal bed mobility: Needs Assistance Bed Mobility: Supine to Sit     Supine to sit: Min assist     General bed mobility comments: MinA to assist with scooting hips,  increased time needed and HOB elevate    Transfers Overall transfer level: Needs assistance Equipment used: Rolling walker (2 wheels) Transfers: Sit to/from Stand Sit to Stand: Mod assist           General transfer comment: VCs for hand placement, modA to power up and increased time required to achieve full stand    Ambulation/Gait Ambulation/Gait assistance: Min assist Gait Distance (Feet): 10 Feet Assistive device: Rolling walker (2 wheels) Gait Pattern/deviations: Step-to pattern, Decreased stance time - left, Decreased step length - right, Antalgic, Trunk flexed Gait velocity: decreased Gait velocity interpretation: <1.31 ft/sec, indicative of household ambulator   General Gait Details: Slow antalgic gait with decreased foot clearance on RLE   Stairs             Wheelchair Mobility    Modified Rankin (Stroke Patients Only)       Balance Overall balance assessment: Needs assistance Sitting-balance support: No upper extremity supported, Feet supported Sitting balance-Leahy Scale: Good     Standing balance support: Bilateral upper extremity supported, During functional activity Standing balance-Leahy Scale: Poor Standing balance comment: reliant on RW for support                            Cognition Arousal/Alertness: Lethargic Behavior During Therapy: Flat affect Overall Cognitive Status: Within Functional Limits for tasks assessed                                 General Comments: Pt was groggy and slow to respond to commands        Exercises  Total Joint Exercises Heel Slides: AAROM, Left, 10 reps, Seated Straight Leg Raises: Left, 10 reps, AAROM, Supine Long Arc Quad: Left, 10 reps, Seated, AROM Goniometric ROM: 10-55 degrees    General Comments        Pertinent Vitals/Pain Pain Assessment Pain Assessment: Faces Faces Pain Scale: Hurts whole lot Pain Location: L knee Pain Descriptors / Indicators: Constant,  Grimacing, Operative site guarding Pain Intervention(s): Limited activity within patient's tolerance, Monitored during session, Ice applied    Home Living                          Prior Function            PT Goals (current goals can now be found in the care plan section) Acute Rehab PT Goals Patient Stated Goal: to return home PT Goal Formulation: With patient Time For Goal Achievement: 10/15/21 Potential to Achieve Goals: Good Progress towards PT goals: Progressing toward goals    Frequency    7X/week      PT Plan Current plan remains appropriate    Co-evaluation              AM-PAC PT "6 Clicks" Mobility   Outcome Measure  Help needed turning from your back to your side while in a flat bed without using bedrails?: None Help needed moving from lying on your back to sitting on the side of a flat bed without using bedrails?: A Little Help needed moving to and from a bed to a chair (including a wheelchair)?: A Lot Help needed standing up from a chair using your arms (e.g., wheelchair or bedside chair)?: A Lot Help needed to walk in hospital room?: A Little Help needed climbing 3-5 steps with a railing? : A Lot 6 Click Score: 16    End of Session Equipment Utilized During Treatment: Gait belt;Left knee immobilizer Activity Tolerance: Patient limited by lethargy;Patient limited by pain;Treatment limited secondary to medical complications (Comment) (low BP) Patient left: in chair;with call bell/phone within reach;with family/visitor present Nurse Communication: Mobility status PT Visit Diagnosis: Other abnormalities of gait and mobility (R26.89);Difficulty in walking, not elsewhere classified (R26.2);Pain Pain - Right/Left: Left Pain - part of body: Knee     Time: 2951-8841 PT Time Calculation (min) (ACUTE ONLY): 35 min  Charges:  $Therapeutic Activity: 23-37 mins                     Davina Poke, SPT Acute Rehabilitation Services  Office:  [phone]    Davina Poke 10/02/2021, 10:02 AM

## 2021-10-02 NOTE — Progress Notes (Signed)
  Subjective: Caroline [name] is a 71 y.o. female s/p left TKA.  They are POD 1.  Pt's pain is controlled.  Pt denies numbness/tingling/weakness.  Pt has ambulated with some difficulty.   Denies any chest pain, shortness of breath, abdominal pain, calf pain.  She was able to walk 60 feet with physical therapy yesterday and perform straight leg raise 10 times with PT yesterday.  Objective: Vital signs in last 24 hours: Temp:  [97.2 F (36.2 C)-98.9 F (37.2 C)] 98.9 F (37.2 C) (06/28 0735) Pulse Rate:  [52-84] 79 (06/28 0735) Resp:  [9-18] 18 (06/28 0735) BP: (92-130)/(67-94) 92/72 (06/28 0735) SpO2:  [93 %-99 %] 93 % (06/28 0735) Weight:  [63.9 kg] 63.9 kg (06/27 0921)  Intake/Output from previous day: 06/27 0701 - 06/28 0700 In: 420 [P.O.:120; I.V.:100; IV Piggyback:200] Out: 505 [Urine:475; Blood:30] Intake/Output this shift: No intake/output data recorded.  Exam:  No gross blood or drainage overlying the dressing 2+ DP pulse Sensation intact distally in the left foot Able to dorsiflex and plantarflex the left foot Able to perform straight leg raise No calf tenderness.  Negative Homans' sign.   Labs: No results for input(s): "HGB" in the last 72 hours. No results for input(s): "WBC", "RBC", "HCT", "PLT" in the last 72 hours. No results for input(s): "NA", "K", "CL", "CO2", "BUN", "CREATININE", "GLUCOSE", "CALCIUM" in the last 72 hours. No results for input(s): "LABPT", "INR" in the last 72 hours.  Assessment/Plan: Pt is POD 1 s/p left TKA.    -Plan to discharge to home today or tomorrow pending patient's pain and PT eval  -WBAT with a walker  -She is a little sedated this morning and her pain is well controlled so plan to decrease her opioid medication dose.   Caroline [name] 10/02/2021, 8:23 AM

## 2021-10-02 NOTE — Progress Notes (Signed)
Physical Therapy Treatment Patient Details Name: Caroline [name] MRN: [id number] DOB: [birth year] Today's Date: 10/02/2021   History of Present Illness Pt is a 72yo F s/p L TKA on 6/27. PMH: HTN, osteopenia, asthma    PT Comments    Pt appearing more alert and with increased tolerance to activity this session versus the morning session. Pt able to perform transfers with min guard and occasional minA and ambulated 113ft with one seated rest break using RW and min guard. BP stable this session. Pts family present and all questions and concerns answered at this time. Pt educated on proper mobility progression upon discharge.    Recommendations for follow up therapy are one component of a multi-disciplinary discharge planning process, led by the attending physician.  Recommendations may be updated based on patient status, additional functional criteria and insurance authorization.  Follow Up Recommendations  Follow physician's recommendations for discharge plan and follow up therapies     Assistance Recommended at Discharge PRN  Patient can return home with the following A little help with walking and/or transfers;A little help with bathing/dressing/bathroom;Assistance with cooking/housework;Assist for transportation;Help with stairs or ramp for entrance   Equipment Recommendations  None recommended by PT    Recommendations for Other Services       Precautions / Restrictions Precautions Precautions: Fall;Knee Precaution Booklet Issued: Yes (comment) Precaution Comments: no pillow under knee Required Braces or Orthoses: Knee Immobilizer - Left Knee Immobilizer - Left: Discontinue once straight leg raise with < 10 degree lag Restrictions Weight Bearing Restrictions: Yes LLE Weight Bearing: Weight bearing as tolerated     Mobility  Bed Mobility Overal bed mobility: Needs Assistance Bed Mobility: Supine to Sit, Sit to Supine     Supine to sit: Min guard Sit to supine: Min guard    General bed mobility comments: min guard for safety, increased time and HOB elevated    Transfers Overall transfer level: Needs assistance Equipment used: Rolling walker (2 wheels) Transfers: Sit to/from Stand Sit to Stand: Min guard, Min assist           General transfer comment: VCs for hand placement, mostly min guard but occasional minA for power up    Ambulation/Gait Ambulation/Gait assistance: Min guard, Min assist Gait Distance (Feet): 100 Feet (80 + 20) Assistive device: Rolling walker (2 wheels) Gait Pattern/deviations: Step-through pattern, Decreased stance time - left, Decreased step length - right, Antalgic Gait velocity: decreased Gait velocity interpretation: <1.8 ft/sec, indicate of risk for recurrent falls   General Gait Details: slow antalgic gait, demonstrated good sequencing and heel strike. One minor LOB with minA gait belt assist to correct   Stairs             Wheelchair Mobility    Modified Rankin (Stroke Patients Only)       Balance Overall balance assessment: Needs assistance Sitting-balance support: No upper extremity supported, Feet supported Sitting balance-Leahy Scale: Good     Standing balance support: Bilateral upper extremity supported, During functional activity Standing balance-Leahy Scale: Poor Standing balance comment: reliant on RW for support                            Cognition Arousal/Alertness: Lethargic Behavior During Therapy: Flat affect Overall Cognitive Status: Within Functional Limits for tasks assessed  General Comments: Pt more alert this session but still slow to respond and with flat affect        Exercises Total Joint Exercises Heel Slides: AAROM, Left, 10 reps, Seated Straight Leg Raises: AAROM, Left, 5 reps, Supine Long Arc Quad: Left, 10 reps, Seated, AROM Goniometric ROM: 10-55 degrees    General Comments        Pertinent  Vitals/Pain Pain Assessment Pain Assessment: Faces Faces Pain Scale: Hurts even more Pain Location: L knee Pain Descriptors / Indicators: Constant, Grimacing, Operative site guarding Pain Intervention(s): Limited activity within patient's tolerance, Monitored during session, Ice applied, RN gave pain meds during session    Home Living                          Prior Function            PT Goals (current goals can now be found in the care plan section) Acute Rehab PT Goals Patient Stated Goal: to return home PT Goal Formulation: With patient Time For Goal Achievement: 10/15/21 Potential to Achieve Goals: Good Progress towards PT goals: Progressing toward goals    Frequency    7X/week      PT Plan Current plan remains appropriate    Co-evaluation              AM-PAC PT "6 Clicks" Mobility   Outcome Measure  Help needed turning from your back to your side while in a flat bed without using bedrails?: None Help needed moving from lying on your back to sitting on the side of a flat bed without using bedrails?: A Little Help needed moving to and from a bed to a chair (including a wheelchair)?: A Little Help needed standing up from a chair using your arms (e.g., wheelchair or bedside chair)?: A Little Help needed to walk in hospital room?: A Little Help needed climbing 3-5 steps with a railing? : A Lot 6 Click Score: 18    End of Session Equipment Utilized During Treatment: Gait belt;Left knee immobilizer Activity Tolerance: Patient limited by fatigue;Patient limited by pain Patient left: in bed;with call bell/phone within reach;with family/visitor present Nurse Communication: Mobility status PT Visit Diagnosis: Other abnormalities of gait and mobility (R26.89);Difficulty in walking, not elsewhere classified (R26.2);Pain Pain - Right/Left: Left Pain - part of body: Knee     Time: 3428-7681 PT Time Calculation (min) (ACUTE ONLY): 38 min  Charges:   $Gait Training: 8-22 mins $Therapeutic Activity: 8-22 mins                     Davina Poke, SPT Acute Rehabilitation Services  Office: [phone]    Davina Poke 10/02/2021, 3:43 PM

## 2021-10-02 NOTE — Progress Notes (Signed)
Orthopedic Tech Progress Note Patient Details:  Caroline [name] 1950-12-26 [id number]  CPM Left Knee CPM Left Knee: On Left Knee Flexion (Degrees): 10 Left Knee Extension (Degrees): 50  Post Interventions Patient Tolerated: Well Instructions Provided: Care of device  Caroline [name] OTR/L 10/02/2021, 7:26 PM

## 2021-10-03 MED ORDER — TRAMADOL HCL 50 MG PO TABS
100.0000 mg | ORAL_TABLET | Freq: Four times a day (QID) | ORAL | Status: DC
  Administered 2021-10-03 – 2021-10-04 (×2): 100 mg via ORAL
  Filled 2021-10-03 (×2): qty 2

## 2021-10-03 MED ORDER — GABAPENTIN 100 MG PO CAPS
100.0000 mg | ORAL_CAPSULE | Freq: Two times a day (BID) | ORAL | Status: DC
  Administered 2021-10-03 – 2021-10-06 (×7): 100 mg via ORAL
  Filled 2021-10-03 (×8): qty 1

## 2021-10-03 MED ORDER — HYDROMORPHONE HCL 1 MG/ML IJ SOLN: 0.5000 mg | INTRAMUSCULAR | Status: DC | PRN

## 2021-10-03 NOTE — Progress Notes (Signed)
  Subjective: Patient resting.  Made reasonable progress with therapy yesterday   Objective: Vital signs in last 24 hours: Temp:  [98 F (36.7 C)-98.9 F (37.2 C)] 98.2 F (36.8 C) (06/28 2025) Pulse Rate:  [76-89] 89 (06/29 0445) Resp:  [14-18] 14 (06/29 0445) BP: (92-154)/(63-82) 140/80 (06/29 0445) SpO2:  [93 %-98 %] 95 % (06/29 0445)  Intake/Output from previous day: 06/28 0701 - 06/29 0700 In: 600 [P.O.:600] Out: 0  Intake/Output this shift: No intake/output data recorded.  Exam:  Dorsiflexion/Plantar flexion intact  Labs: No results for input(s): "HGB" in the last 72 hours. No results for input(s): "WBC", "RBC", "HCT", "PLT" in the last 72 hours. No results for input(s): "NA", "K", "CL", "CO2", "BUN", "CREATININE", "GLUCOSE", "CALCIUM" in the last 72 hours. No results for input(s): "LABPT", "INR" in the last 72 hours.  Assessment/Plan: Plan at this time is physical therapy this morning.  Discharge to home this afternoon.  Plan to recheck later this morning.  Did talk with her husband this morning   G Dorene Grebe 10/03/2021, 7:30 AM

## 2021-10-03 NOTE — Progress Notes (Addendum)
Physical Therapy Treatment Patient Details Name: Caroline [name] MRN: [id number] DOB: 27-May-1950 Today's Date: 10/03/2021   History of Present Illness Pt is a 71yo F s/p L TKA on 6/27. PMH: HTN, osteopenia, asthma    PT Comments    Pt with limited ability to follow commands and demonstrated poor safety awareness with mobility. Gait distance limited to 45 feet with RW due to unsteadiness and pt's behavior. MD made aware of pt's status and lack of readiness for discharge.   Recommendations for follow up therapy are one component of a multi-disciplinary discharge planning process, led by the attending physician.  Recommendations may be updated based on patient status, additional functional criteria and insurance authorization.  Follow Up Recommendations  Follow physician's recommendations for discharge plan and follow up therapies     Assistance Recommended at Discharge PRN  Patient can return home with the following A little help with walking and/or transfers;A little help with bathing/dressing/bathroom;Assistance with cooking/housework;Assist for transportation;Help with stairs or ramp for entrance   Equipment Recommendations  None recommended by PT    Recommendations for Other Services       Precautions / Restrictions Precautions Precautions: Fall;Knee Precaution Booklet Issued: Yes (comment) Precaution Comments: no pillow under knee Required Braces or Orthoses: Knee Immobilizer - Left Knee Immobilizer - Left: Discontinue once straight leg raise with < 10 degree lag Restrictions Weight Bearing Restrictions: Yes LLE Weight Bearing: Weight bearing as tolerated     Mobility  Bed Mobility Overal bed mobility: Needs Assistance Bed Mobility: Supine to Sit, Sit to Supine     Supine to sit: Supervision Sit to supine: Supervision   General bed mobility comments: Pt able to perform bed mobility with little to no difficulty.    Transfers Overall transfer level: Needs  assistance Equipment used: Rolling walker (2 wheels) Transfers: Sit to/from Stand Sit to Stand: Min guard, Min assist           General transfer comment: Pt performed four sit <> stands (one from bed and three from chair). Pt required cues for hand placements and moving L LE out 2/2 knee immobilizer and pain. Pt with impulsive behavior and attempting to stand when therapist not ready with RW.    Ambulation/Gait Ambulation/Gait assistance: Min guard, Min assist Gait Distance (Feet): 45 Feet Assistive device: Rolling walker (2 wheels) Gait Pattern/deviations: Decreased stance time - left, Decreased step length - right, Antalgic, Step-to pattern, Decreased step length - left Gait velocity: decreased Gait velocity interpretation: <1.31 ft/sec, indicative of household ambulator   General Gait Details: very slow pace with antalgic gait. Pt with unsteadiness especially when taking large steps past RW despite cues not to. Pt provided with verbal and demonstration cues in hallway while resting in chair but pt unable to carryover instructions. Pt also required assistance at times to move RW. Chair followed pt.   Stairs             Wheelchair Mobility    Modified Rankin (Stroke Patients Only)       Balance Overall balance assessment: Needs assistance Sitting-balance support: No upper extremity supported, Feet supported Sitting balance-Leahy Scale: Good     Standing balance support: Bilateral upper extremity supported, During functional activity Standing balance-Leahy Scale: Poor Standing balance comment: reliant on RW for support                            Cognition Arousal/Alertness: Awake/alert Behavior During Therapy: Flat affect Overall Cognitive Status:  Impaired/Different from baseline Area of Impairment: Attention, Memory, Awareness, Safety/judgement, Orientation                 Orientation Level: Disoriented to, Place, Time (able to orient to month  but not year) Current Attention Level: Sustained Memory: Decreased short-term memory, Decreased recall of precautions   Safety/Judgement: Decreased awareness of safety, Decreased awareness of deficits Awareness: Emergent, Intellectual   General Comments: Pt with poor ability to follow commands with sequencing during gait. Pt provided limited effort during exercises.        Exercises Total Joint Exercises Heel Slides:  (CPM) Hip ABduction/ADduction: Both, 10 reps, Supine (pt required extensive time and cues to perform with minimal effort provided from pt on L LE) Straight Leg Raises:  (pt unable on left when attempted) Goniometric ROM: 10-55    General Comments General comments (skin integrity, edema, etc.): VSS prior to session      Pertinent Vitals/Pain Pain Assessment Pain Assessment: Faces Faces Pain Scale: Hurts whole lot Pain Location: L knee Pain Descriptors / Indicators: Constant, Grimacing, Operative site guarding Pain Intervention(s): Limited activity within patient's tolerance, Monitored during session, Ice applied    Home Living                          Prior Function            PT Goals (current goals can now be found in the care plan section) Acute Rehab PT Goals Patient Stated Goal: to return home PT Goal Formulation: With patient Time For Goal Achievement: 10/15/21 Potential to Achieve Goals: Good Progress towards PT goals: Not progressing toward goals - comment (Limited progress 2/2 mental status; will continue to assess)    Frequency    7X/week      PT Plan Current plan remains appropriate    Co-evaluation              AM-PAC PT "6 Clicks" Mobility   Outcome Measure  Help needed turning from your back to your side while in a flat bed without using bedrails?: A Little Help needed moving from lying on your back to sitting on the side of a flat bed without using bedrails?: A Little Help needed moving to and from a bed to a  chair (including a wheelchair)?: A Little Help needed standing up from a chair using your arms (e.g., wheelchair or bedside chair)?: A Little Help needed to walk in hospital room?: A Little Help needed climbing 3-5 steps with a railing? : Total 6 Click Score: 16    End of Session Equipment Utilized During Treatment: Gait belt;Left knee immobilizer Activity Tolerance: Patient limited by fatigue;Patient limited by pain;Other (comment) (confusion) Patient left: in bed;with call bell/phone within reach;with family/visitor present;with bed alarm set Nurse Communication: Mobility status PT Visit Diagnosis: Other abnormalities of gait and mobility (R26.89);Difficulty in walking, not elsewhere classified (R26.2);Pain Pain - Right/Left: Left Pain - part of body: Knee     Time: 1430-1500 PT Time Calculation (min) (ACUTE ONLY): 30 min  Charges:  $Gait Training: 8-22 mins $Therapeutic Exercise: 8-22 mins                     Tana Coast, PT    [employer] 10/03/2021, 3:21 PM

## 2021-10-03 NOTE — Care Management Obs Status (Signed)
MEDICARE OBSERVATION STATUS NOTIFICATION   Patient Details  Name: Caroline [name] MRN: [id number] Date of Birth: [birth year]   Medicare Observation Status Notification Given:  Yes    Epifanio Lesches, RN 10/03/2021, 2:44 PM

## 2021-10-03 NOTE — Plan of Care (Signed)
  Problem: Education: Goal: Knowledge of General Education information will improve Description: Including pain rating scale, medication(s)/side effects and non-pharmacologic comfort measures Outcome: Not Progressing   Problem: Health Behavior/Discharge Planning: Goal: Ability to manage health-related needs will improve Outcome: Not Progressing   Problem: Clinical Measurements: Goal: Ability to maintain clinical measurements within normal limits will improve Outcome: Not Progressing Goal: Will remain free from infection Outcome: Not Progressing Goal: Diagnostic test results will improve Outcome: Not Progressing Goal: Respiratory complications will improve Outcome: Not Progressing Goal: Cardiovascular complication will be avoided Outcome: Not Progressing   Problem: Nutrition: Goal: Adequate nutrition will be maintained Outcome: Not Progressing   Problem: Coping: Goal: Level of anxiety will decrease Outcome: Not Progressing   Problem: Elimination: Goal: Will not experience complications related to bowel motility Outcome: Not Progressing Goal: Will not experience complications related to urinary retention Outcome: Not Progressing   

## 2021-10-03 NOTE — Plan of Care (Signed)
  Problem: Clinical Measurements: Goal: Respiratory complications will improve Outcome: Progressing   Problem: Activity: Goal: Risk for activity intolerance will decrease Outcome: Progressing   Problem: Nutrition: Goal: Adequate nutrition will be maintained Outcome: Progressing   Problem: Elimination: Goal: Will not experience complications related to urinary retention Outcome: Progressing   Problem: Pain Managment: Goal: General experience of comfort will improve Outcome: Progressing   Problem: Safety: Goal: Ability to remain free from injury will improve Outcome: Progressing   

## 2021-10-03 NOTE — Progress Notes (Addendum)
Physical Therapy Treatment Patient Details Name: Caroline [name] MRN: [id number] DOB: 02-18-1951 Today's Date: 10/03/2021   History of Present Illness Pt is a 71yo F s/p L TKA on 6/27. PMH: HTN, osteopenia, asthma    PT Comments    Pt limited today by pain, confusion, lethargy, and weakness. Pt required more assistance for bed mobility and unable to ambulate as far. Pt also unable to follow through with commands during attempt at exercises after gait training. Will progress as tolerated in afternoon.   Recommendations for follow up therapy are one component of a multi-disciplinary discharge planning process, led by the attending physician.  Recommendations may be updated based on patient status, additional functional criteria and insurance authorization.  Follow Up Recommendations  Follow physician's recommendations for discharge plan and follow up therapies     Assistance Recommended at Discharge PRN  Patient can return home with the following A little help with walking and/or transfers;A little help with bathing/dressing/bathroom;Assistance with cooking/housework;Assist for transportation;Help with stairs or ramp for entrance   Equipment Recommendations  None recommended by PT    Recommendations for Other Services       Precautions / Restrictions Precautions Precautions: Fall;Knee Precaution Booklet Issued: Yes (comment) Precaution Comments: no pillow under knee Required Braces or Orthoses: Knee Immobilizer - Left Knee Immobilizer - Left: Discontinue once straight leg raise with < 10 degree lag Restrictions Weight Bearing Restrictions: Yes LLE Weight Bearing: Weight bearing as tolerated     Mobility  Bed Mobility Overal bed mobility: Needs Assistance Bed Mobility: Supine to Sit, Sit to Supine     Supine to sit: Mod assist Sit to supine: Mod assist   General bed mobility comments: Pt required increased time and HOB elevated. Pt unable to utilize R LE to move L LE on/off  bed and thus physical assistance required. HHA and sling pad utilized during supine to sit.    Transfers Overall transfer level: Needs assistance Equipment used: Rolling walker (2 wheels) Transfers: Sit to/from Stand Sit to Stand: Min guard, Min assist           General transfer comment: Pt performed three sit <> stands (two from bed and one from elevated toilet after using bathroom). Pt required cues for hand placements and moving L LE out 2/2 knee immobilizer and pain.    Ambulation/Gait Ambulation/Gait assistance: Min guard, Min assist Gait Distance (Feet): 80 Feet (20 feet to and from bathroom and 60 feet in hall respectively) Assistive device: Rolling walker (2 wheels) Gait Pattern/deviations: Decreased stance time - left, Decreased step length - right, Antalgic, Step-to pattern, Decreased step length - left Gait velocity: decreased Gait velocity interpretation: <1.31 ft/sec, indicative of household ambulator   General Gait Details: very slow pace and antalgic gait; no LOB occurred. Pt required assistance at times to move RW and keep appropriate positioning as well as cues to stay alert. Chair followed pt but she did not require it.   Stairs             Wheelchair Mobility    Modified Rankin (Stroke Patients Only)       Balance Overall balance assessment: Needs assistance Sitting-balance support: No upper extremity supported, Feet supported Sitting balance-Leahy Scale: Good     Standing balance support: Bilateral upper extremity supported, During functional activity Standing balance-Leahy Scale: Poor Standing balance comment: reliant on RW for support  Cognition Arousal/Alertness: Lethargic Behavior During Therapy: Flat affect Overall Cognitive Status: Impaired/Different from baseline Area of Impairment: Attention, Memory, Awareness, Safety/judgement, Orientation                 Orientation Level: Disoriented  to, Place, Time   Memory: Decreased short-term memory, Decreased recall of precautions   Safety/Judgement: Decreased awareness of safety, Decreased awareness of deficits     General Comments: Pt required increased cues for mobility and safety awareness. Pt's husband states pain meds have been causing pt to be in this confused state.        Exercises Total Joint Exercises Heel Slides:  (pt not following through with commands and unable to perform on left) Straight Leg Raises:  (pt unable on left when attempted)    General Comments General comments (skin integrity, edema, etc.): BP 134/90, HR 99, and 96% SpO2      Pertinent Vitals/Pain Pain Assessment Faces Pain Scale: Hurts whole lot Pain Location: L knee Pain Descriptors / Indicators: Constant, Grimacing, Operative site guarding Pain Intervention(s): Limited activity within patient's tolerance, Monitored during session, Ice applied    Home Living                          Prior Function            PT Goals (current goals can now be found in the care plan section) Acute Rehab PT Goals Patient Stated Goal: to return home PT Goal Formulation: With patient Time For Goal Achievement: 10/15/21 Potential to Achieve Goals: Good Progress towards PT goals: Progressing toward goals (slowed progress compared to last session)    Frequency    7X/week      PT Plan Current plan remains appropriate    Co-evaluation              AM-PAC PT "6 Clicks" Mobility   Outcome Measure  Help needed turning from your back to your side while in a flat bed without using bedrails?: A Lot Help needed moving from lying on your back to sitting on the side of a flat bed without using bedrails?: A Lot Help needed moving to and from a bed to a chair (including a wheelchair)?: A Little Help needed standing up from a chair using your arms (e.g., wheelchair or bedside chair)?: A Little Help needed to walk in hospital room?: A  Little Help needed climbing 3-5 steps with a railing? : A Lot 6 Click Score: 15    End of Session Equipment Utilized During Treatment: Gait belt;Left knee immobilizer Activity Tolerance: Patient limited by fatigue;Patient limited by pain;Other (comment) (lethargy and confusion) Patient left: in bed;with call bell/phone within reach;with family/visitor present;with bed alarm set Nurse Communication: Mobility status;Other (comment) (mental status) PT Visit Diagnosis: Other abnormalities of gait and mobility (R26.89);Difficulty in walking, not elsewhere classified (R26.2);Pain Pain - Right/Left: Left Pain - part of body: Knee     Time: 4481-8563 PT Time Calculation (min) (ACUTE ONLY): 38 min  Charges:  $Gait Training: 23-37 mins $Therapeutic Activity: 8-22 mins                     Tana Coast, PT    [employer] 10/03/2021, 10:45 AM

## 2021-10-04 ENCOUNTER — Observation Stay (HOSPITAL_COMMUNITY): Payer: Medicare HMO

## 2021-10-04 DIAGNOSIS — Z83438 Family history of other disorder of lipoprotein metabolism and other lipidemia: Secondary | ICD-10-CM | POA: Diagnosis not present

## 2021-10-04 DIAGNOSIS — R443 Hallucinations, unspecified: Secondary | ICD-10-CM | POA: Diagnosis not present

## 2021-10-04 DIAGNOSIS — Z811 Family history of alcohol abuse and dependence: Secondary | ICD-10-CM | POA: Diagnosis not present

## 2021-10-04 DIAGNOSIS — Z87891 Personal history of nicotine dependence: Secondary | ICD-10-CM | POA: Diagnosis not present

## 2021-10-04 DIAGNOSIS — F0282 Dementia in other diseases classified elsewhere, unspecified severity, with psychotic disturbance: Secondary | ICD-10-CM | POA: Diagnosis present

## 2021-10-04 DIAGNOSIS — T43225A Adverse effect of selective serotonin reuptake inhibitors, initial encounter: Secondary | ICD-10-CM | POA: Diagnosis not present

## 2021-10-04 DIAGNOSIS — M1712 Unilateral primary osteoarthritis, left knee: Secondary | ICD-10-CM | POA: Diagnosis present

## 2021-10-04 DIAGNOSIS — J449 Chronic obstructive pulmonary disease, unspecified: Secondary | ICD-10-CM | POA: Diagnosis present

## 2021-10-04 DIAGNOSIS — G9340 Encephalopathy, unspecified: Secondary | ICD-10-CM | POA: Diagnosis not present

## 2021-10-04 DIAGNOSIS — Z79899 Other long term (current) drug therapy: Secondary | ICD-10-CM | POA: Diagnosis not present

## 2021-10-04 DIAGNOSIS — Z8249 Family history of ischemic heart disease and other diseases of the circulatory system: Secondary | ICD-10-CM | POA: Diagnosis not present

## 2021-10-04 DIAGNOSIS — M8589 Other specified disorders of bone density and structure, multiple sites: Secondary | ICD-10-CM | POA: Diagnosis present

## 2021-10-04 DIAGNOSIS — I10 Essential (primary) hypertension: Secondary | ICD-10-CM | POA: Diagnosis present

## 2021-10-04 DIAGNOSIS — Z808 Family history of malignant neoplasm of other organs or systems: Secondary | ICD-10-CM | POA: Diagnosis not present

## 2021-10-04 DIAGNOSIS — Z7982 Long term (current) use of aspirin: Secondary | ICD-10-CM | POA: Diagnosis not present

## 2021-10-04 DIAGNOSIS — Z803 Family history of malignant neoplasm of breast: Secondary | ICD-10-CM | POA: Diagnosis not present

## 2021-10-04 DIAGNOSIS — F05 Delirium due to known physiological condition: Secondary | ICD-10-CM | POA: Diagnosis not present

## 2021-10-04 DIAGNOSIS — F0283 Dementia in other diseases classified elsewhere, unspecified severity, with mood disturbance: Secondary | ICD-10-CM | POA: Diagnosis present

## 2021-10-04 DIAGNOSIS — G928 Other toxic encephalopathy: Secondary | ICD-10-CM | POA: Diagnosis not present

## 2021-10-04 DIAGNOSIS — G43709 Chronic migraine without aura, not intractable, without status migrainosus: Secondary | ICD-10-CM | POA: Diagnosis not present

## 2021-10-04 DIAGNOSIS — F419 Anxiety disorder, unspecified: Secondary | ICD-10-CM | POA: Diagnosis present

## 2021-10-04 DIAGNOSIS — K219 Gastro-esophageal reflux disease without esophagitis: Secondary | ICD-10-CM | POA: Diagnosis present

## 2021-10-04 DIAGNOSIS — Z823 Family history of stroke: Secondary | ICD-10-CM | POA: Diagnosis not present

## 2021-10-04 DIAGNOSIS — Z90710 Acquired absence of both cervix and uterus: Secondary | ICD-10-CM | POA: Diagnosis not present

## 2021-10-04 DIAGNOSIS — T402X5A Adverse effect of other opioids, initial encounter: Secondary | ICD-10-CM | POA: Diagnosis not present

## 2021-10-04 DIAGNOSIS — E7800 Pure hypercholesterolemia, unspecified: Secondary | ICD-10-CM | POA: Diagnosis present

## 2021-10-04 DIAGNOSIS — F32A Depression, unspecified: Secondary | ICD-10-CM | POA: Diagnosis present

## 2021-10-04 LAB — CBC WITH DIFFERENTIAL/PLATELET
Abs Immature Granulocytes: 0.04 10*3/uL (ref 0.00–0.07)
Basophils Absolute: 0 10*3/uL (ref 0.0–0.1)
Basophils Relative: 0 %
Eosinophils Absolute: 0 10*3/uL (ref 0.0–0.5)
Eosinophils Relative: 1 %
HCT: 25 % — ABNORMAL LOW (ref 36.0–46.0)
Hemoglobin: 8.3 g/dL — ABNORMAL LOW (ref 12.0–15.0)
Immature Granulocytes: 1 %
Lymphocytes Relative: 13 %
Lymphs Abs: 1 10*3/uL (ref 0.7–4.0)
MCH: 30.2 pg (ref 26.0–34.0)
MCHC: 33.2 g/dL (ref 30.0–36.0)
MCV: 90.9 fL (ref 80.0–100.0)
Monocytes Absolute: 0.7 10*3/uL (ref 0.1–1.0)
Monocytes Relative: 9 %
Neutro Abs: 5.9 10*3/uL (ref 1.7–7.7)
Neutrophils Relative %: 76 %
Platelets: 188 10*3/uL (ref 150–400)
RBC: 2.75 MIL/uL — ABNORMAL LOW (ref 3.87–5.11)
RDW: 14.4 % (ref 11.5–15.5)
WBC: 7.7 10*3/uL (ref 4.0–10.5)
nRBC: 0 % (ref 0.0–0.2)

## 2021-10-04 LAB — URINALYSIS, ROUTINE W REFLEX MICROSCOPIC
Bilirubin Urine: NEGATIVE
Glucose, UA: NEGATIVE mg/dL
Hgb urine dipstick: NEGATIVE
Ketones, ur: NEGATIVE mg/dL
Leukocytes,Ua: NEGATIVE
Nitrite: NEGATIVE
Protein, ur: NEGATIVE mg/dL
Specific Gravity, Urine: 1.014 (ref 1.005–1.030)
pH: 6 (ref 5.0–8.0)

## 2021-10-04 LAB — AMMONIA: Ammonia: 41 umol/L — ABNORMAL HIGH (ref 9–35)

## 2021-10-04 LAB — COMPREHENSIVE METABOLIC PANEL
ALT: 14 U/L (ref 0–44)
AST: 18 U/L (ref 15–41)
Albumin: 3.3 g/dL — ABNORMAL LOW (ref 3.5–5.0)
Alkaline Phosphatase: 58 U/L (ref 38–126)
Anion gap: 9 (ref 5–15)
BUN: 11 mg/dL (ref 8–23)
CO2: 22 mmol/L (ref 22–32)
Calcium: 8.8 mg/dL — ABNORMAL LOW (ref 8.9–10.3)
Chloride: 106 mmol/L (ref 98–111)
Creatinine, Ser: 0.59 mg/dL (ref 0.44–1.00)
GFR, Estimated: >60 mL/min (ref >60)
Glucose, Bld: 124 mg/dL — ABNORMAL HIGH (ref 70–99)
Potassium: 3.2 mmol/L — ABNORMAL LOW (ref 3.5–5.1)
Sodium: 137 mmol/L (ref 135–145)
Total Bilirubin: 0.7 mg/dL (ref 0.3–1.2)
Total Protein: 6.2 g/dL — ABNORMAL LOW (ref 6.5–8.1)

## 2021-10-04 LAB — TSH: TSH: 1.305 u[IU]/mL (ref 0.350–4.500)

## 2021-10-04 LAB — MAGNESIUM: Magnesium: 2 mg/dL (ref 1.7–2.4)

## 2021-10-04 LAB — VITAMIN B12: Vitamin B-12: 653 pg/mL (ref 180–914)

## 2021-10-04 MED ORDER — SERTRALINE HCL 50 MG PO TABS
150.0000 mg | ORAL_TABLET | Freq: Every day | ORAL | Status: DC
  Administered 2021-10-05 – 2021-10-07 (×3): 150 mg via ORAL
  Filled 2021-10-04 (×3): qty 1

## 2021-10-04 MED ORDER — LACTATED RINGERS IV SOLN: INTRAVENOUS | Status: DC

## 2021-10-04 MED ORDER — POTASSIUM CHLORIDE CRYS ER 20 MEQ PO TBCR
40.0000 meq | EXTENDED_RELEASE_TABLET | Freq: Once | ORAL | Status: AC
  Administered 2021-10-04: 40 meq via ORAL
  Filled 2021-10-04: qty 2

## 2021-10-04 NOTE — Assessment & Plan Note (Signed)
Continue crestor daily  

## 2021-10-04 NOTE — Assessment & Plan Note (Signed)
Continue toprol-xl 100mg  daily

## 2021-10-04 NOTE — Assessment & Plan Note (Addendum)
71 year old how is POD #3 from total left knee arthoplasty who had acute onset hallucinations and altered mental status that we were asked to see -stat head CT-no abnormal neurological finding except some confusion -not currently hallucinating, but admits to non threatening visual and auditory hallucinations.  -infectious work up including CXR, urine and urine culture. Afebrile and wound nice and dry with no signs of infection  -metabolic work up with ammonia/TSH/B12  -routine labs for electrolytes/renal function/WBC  -pain medication has been stopped -delirium precautions for hospital delirium  -other differentials include post-op/hospital delirium  -gentle IVF -IS to bedside  -continue to monitor

## 2021-10-04 NOTE — Assessment & Plan Note (Signed)
POD #3 recs per ortho

## 2021-10-04 NOTE — Progress Notes (Signed)
Physical Therapy Treatment Patient Details Name: Caroline [name] MRN: [id number] DOB: 1950/07/21 Today's Date: 10/04/2021   History of Present Illness Pt is a 71yo F s/p L TKA on 6/27. PMH: HTN, osteopenia, asthma    PT Comments    Pt lethargic and provided poor effort with therapeutic exercises. Deferred OOB mobility. CPM and ice applied. Pt's son present who was educated on pt's status.  Recommendations for follow up therapy are one component of a multi-disciplinary discharge planning process, led by the attending physician.  Recommendations may be updated based on patient status, additional functional criteria and insurance authorization.  Follow Up Recommendations  Follow physician's recommendations for discharge plan and follow up therapies     Assistance Recommended at Discharge PRN  Patient can return home with the following A little help with walking and/or transfers;A little help with bathing/dressing/bathroom;Assistance with cooking/housework;Assist for transportation;Help with stairs or ramp for entrance   Equipment Recommendations  Hospital bed (with bed alarm unless cognition improves)    Recommendations for Other Services       Precautions / Restrictions Precautions Precautions: Fall;Knee Precaution Booklet Issued: Yes (comment) Precaution Comments: no pillow under knee Required Braces or Orthoses: Knee Immobilizer - Left Knee Immobilizer - Left: Discontinue once straight leg raise with < 10 degree lag Restrictions Weight Bearing Restrictions: Yes LLE Weight Bearing: Weight bearing as tolerated     Mobility  Bed Mobility     General bed mobility comments: did not perform 2/2 mental state    Transfers            General transfer comment: did not perform 2/2 mental state    Ambulation/Gait    General Gait Details: did not perform 2/2 mental state   Stairs             Wheelchair Mobility    Modified Rankin (Stroke Patients Only)        Balance Overall balance assessment: Needs assistance Sitting-balance support: No upper extremity supported, Feet supported Sitting balance-Leahy Scale: Good     Standing balance support: Bilateral upper extremity supported, During functional activity Standing balance-Leahy Scale: Poor Standing balance comment: reliant on RW for support                            Cognition Arousal/Alertness: Lethargic Behavior During Therapy: Flat affect Overall Cognitive Status: Impaired/Different from baseline Area of Impairment: Attention, Memory, Awareness, Safety/judgement, Orientation                 Orientation Level: Disoriented to, Time (pt oriented to place and self but required cues to orient to time) Current Attention Level: Sustained Memory: Decreased short-term memory, Decreased recall of precautions   Safety/Judgement: Decreased awareness of safety, Decreased awareness of deficits Awareness: Emergent, Intellectual   General Comments: Slow processing. Pt in and out of sleep. Poor ability to follow commands        Exercises Total Joint Exercises Ankle Circles/Pumps: Both, Supine, 5 reps (max cues) Heel Slides: 5 reps, Left (limited effort from pt; max cues) Straight Leg Raises:  (pt unable on left when attempted)    General Comments        Pertinent Vitals/Pain Pain Assessment Pain Assessment: 0-10 Pain Score:  (pt unable to verbalize pain score) Pain Location: L knee Pain Descriptors / Indicators: Constant, Grimacing, Operative site guarding Pain Intervention(s): Monitored during session, Limited activity within patient's tolerance, Ice applied    Home Living  Prior Function            PT Goals (current goals can now be found in the care plan section) Acute Rehab PT Goals Patient Stated Goal: to return home PT Goal Formulation: With patient Time For Goal Achievement: 10/15/21 Potential to Achieve Goals:  Good Progress towards PT goals: Not progressing toward goals - comment (limited by pt's AMS)    Frequency    7X/week      PT Plan Current plan remains appropriate    Co-evaluation              AM-PAC PT "6 Clicks" Mobility   Outcome Measure  Help needed turning from your back to your side while in a flat bed without using bedrails?: A Little Help needed moving from lying on your back to sitting on the side of a flat bed without using bedrails?: A Little Help needed moving to and from a bed to a chair (including a wheelchair)?: A Little Help needed standing up from a chair using your arms (e.g., wheelchair or bedside chair)?: A Little Help needed to walk in hospital room?: A Little Help needed climbing 3-5 steps with a railing? : Total 6 Click Score: 16    End of Session Equipment Utilized During Treatment: Gait belt;Left knee immobilizer Activity Tolerance: Patient limited by fatigue;Patient limited by pain;Other (comment) (confusion) Patient left: in bed;with call bell/phone within reach;with family/visitor present;with bed alarm set Nurse Communication: Mobility status PT Visit Diagnosis: Other abnormalities of gait and mobility (R26.89);Difficulty in walking, not elsewhere classified (R26.2);Pain Pain - Right/Left: Left Pain - part of body: Knee     Time: 7262-0355 PT Time Calculation (min) (ACUTE ONLY): 20 min  Charges:  $Therapeutic Exercise: 8-22 mins                     Tana Coast, PT    [employer] 10/04/2021, 3:11 PM

## 2021-10-04 NOTE — Assessment & Plan Note (Signed)
Continue zoloft daily  

## 2021-10-04 NOTE — Progress Notes (Signed)
Physical Therapy Treatment Patient Details Name: Caroline [name] MRN: [id number] DOB: 08-18-1950 Today's Date: 10/04/2021   History of Present Illness Pt is a 71yo F s/p L TKA on 6/27. PMH: HTN, osteopenia, asthma    PT Comments    Pt instructed in and performed gait training. Pt with improved stability with RW but extremely slow gait speed present. Pt remains confused and disoriented; she is unable to follow commands through for simple bed exercises. Pt's husband wants to take pt home but safety a major concern. Will see pt again this afternoon.  Recommendations for follow up therapy are one component of a multi-disciplinary discharge planning process, led by the attending physician.  Recommendations may be updated based on patient status, additional functional criteria and insurance authorization.  Follow Up Recommendations  Follow physician's recommendations for discharge plan and follow up therapies     Assistance Recommended at Discharge PRN  Patient can return home with the following A little help with walking and/or transfers;A little help with bathing/dressing/bathroom;Assistance with cooking/housework;Assist for transportation;Help with stairs or ramp for entrance   Equipment Recommendations  Hospital bed (with bed alarm unless cognition improves)    Recommendations for Other Services       Precautions / Restrictions Precautions Precautions: Fall;Knee Precaution Booklet Issued: Yes (comment) Precaution Comments: no pillow under knee Required Braces or Orthoses: Knee Immobilizer - Left Knee Immobilizer - Left: Discontinue once straight leg raise with < 10 degree lag Restrictions Weight Bearing Restrictions: Yes LLE Weight Bearing: Weight bearing as tolerated     Mobility  Bed Mobility Overal bed mobility: Needs Assistance Bed Mobility: Supine to Sit, Sit to Supine     Supine to sit: Min assist Sit to supine: Min assist   General bed mobility comments: Min assist for  LE management; pt requires increased time to perform.    Transfers Overall transfer level: Needs assistance Equipment used: Rolling walker (2 wheels) Transfers: Sit to/from Stand Sit to Stand: Min guard           General transfer comment: Two sit to stands performed (one from bed and one from chair).    Ambulation/Gait Ambulation/Gait assistance: Min guard, Min assist Gait Distance (Feet): 80 Feet Assistive device: Rolling walker (2 wheels) Gait Pattern/deviations: Decreased stance time - left, Decreased step length - right, Antalgic, Step-to pattern, Decreased step length - left Gait velocity: decreased Gait velocity interpretation: <1.31 ft/sec, indicative of household ambulator   General Gait Details: very slow pace but pt able to follow commands for appropriate step length. Chair followed pt and rest break required after 80 feet complete to bring her back to room. Pt high risk for falls.   Stairs             Wheelchair Mobility    Modified Rankin (Stroke Patients Only)       Balance Overall balance assessment: Needs assistance Sitting-balance support: No upper extremity supported, Feet supported Sitting balance-Leahy Scale: Good     Standing balance support: Bilateral upper extremity supported, During functional activity Standing balance-Leahy Scale: Poor Standing balance comment: reliant on RW for support                            Cognition Arousal/Alertness: Lethargic Behavior During Therapy: Flat affect Overall Cognitive Status: Impaired/Different from baseline Area of Impairment: Attention, Memory, Awareness, Safety/judgement, Orientation                 Orientation Level: Disoriented to,  Place, Time (unable to orient to current month, time of day, or year) Current Attention Level: Sustained Memory: Decreased short-term memory, Decreased recall of precautions   Safety/Judgement: Decreased awareness of safety, Decreased  awareness of deficits Awareness: Emergent, Intellectual   General Comments: Pt able to follow simple commands but slow processing.        Exercises Total Joint Exercises Heel Slides:  (limited effort and unable to perform more than a few reps) Straight Leg Raises:  (pt unable on left when attempted)    General Comments        Pertinent Vitals/Pain Pain Assessment Pain Assessment: 0-10 Pain Score: 7  Pain Location: L knee Pain Descriptors / Indicators: Constant, Grimacing, Operative site guarding Pain Intervention(s): Limited activity within patient's tolerance, Monitored during session, Ice applied    Home Living                          Prior Function            PT Goals (current goals can now be found in the care plan section) Acute Rehab PT Goals Patient Stated Goal: to return home PT Goal Formulation: With patient Time For Goal Achievement: 10/15/21 Potential to Achieve Goals: Good Progress towards PT goals: Progressing toward goals (slow progress 2/2 cognition)    Frequency    7X/week      PT Plan Current plan remains appropriate    Co-evaluation              AM-PAC PT "6 Clicks" Mobility   Outcome Measure  Help needed turning from your back to your side while in a flat bed without using bedrails?: A Little Help needed moving from lying on your back to sitting on the side of a flat bed without using bedrails?: A Little Help needed moving to and from a bed to a chair (including a wheelchair)?: A Little Help needed standing up from a chair using your arms (e.g., wheelchair or bedside chair)?: A Little Help needed to walk in hospital room?: A Little Help needed climbing 3-5 steps with a railing? : Total 6 Click Score: 16    End of Session Equipment Utilized During Treatment: Gait belt;Left knee immobilizer Activity Tolerance: Patient limited by fatigue;Patient limited by pain;Other (comment) (confusion) Patient left: in bed;with call  bell/phone within reach;with family/visitor present;with bed alarm set Nurse Communication: Mobility status PT Visit Diagnosis: Other abnormalities of gait and mobility (R26.89);Difficulty in walking, not elsewhere classified (R26.2);Pain Pain - Right/Left: Left Pain - part of body: Knee     Time: 3818-2993 PT Time Calculation (min) (ACUTE ONLY): 33 min  Charges:  $Gait Training: 23-37 mins                     Tana Coast, PT    [employer] 10/04/2021, 10:04 AM

## 2021-10-04 NOTE — Consult Note (Addendum)
Initial Consultation Note   Patient: Caroline [name] ZOX:[id number] DOB: [birth year] PCP: Aliene Beams, MD DOA: 10/01/2021 DOS: the patient was seen and examined on 10/04/2021 Primary service: Cammy Copa, MD  Referring physician: Dr. August Saucer  Reason for consult: AMS   Assessment/Plan: Assessment and Plan: * Acute encephalopathy/delirium  71 year old how is POD #3 from total left knee arthoplasty who had acute onset hallucinations and altered mental status that we were asked to see -stat head CT-no abnormal neurological finding except some confusion -not currently hallucinating, but admits to non threatening visual and auditory hallucinations.  -infectious work up including CXR, urine and urine culture. Afebrile and wound nice and dry with no signs of infection  -metabolic work up with ammonia/TSH/B12  -routine labs for electrolytes/renal function/WBC  -pain medication has been stopped -delirium precautions for hospital delirium  -other differentials include post-op/hospital delirium  -gentle IVF -IS to bedside  -continue to monitor   S/P total knee arthroplasty, left POD #3 recs per ortho   Essential hypertension, benign Continue toprol-xl 100mg  daily   Chronic migraine without aura without status migrainosus, not intractable Has chronic migraines, no change in these Recent brain MRI on 5/30 with no acute finding  Continue outpatient f/u with neurology and home medication   Anxiety and depression Continue zoloft daily   Pure hypercholesterolemia Continue crestor daily      [facility] will continue to follow the patient.  HPI: Caroline [name] is a 71 y.o. female with past medical history of HLD, HTN, GERD, asthma, depression/anxiety and chronic migraines who presented to hospital for left knee replacement on 10/01/2021.  POD #1 mentation seemed wnl and considered discharge; however, she started to have hallucinations and altered mental status changes. She has become less  responsive, vitals stable. Pain medication was stopped. Her husband is at bedside and reports she has been confused since she came out of surgery.  Yesterday she started having visual and auditory hallucinations. She thought she was holding a phone and dialing in the air. Seeing things that aren't there.  She has also heard parents talk to her. Nothing threatening, just talking to her. Her husband also states she is sleeping more today.  She has had surgery before and her husband states she had some confusion, but not to this degree.   Review of Systems: As mentioned in the history of present illness. All other systems reviewed and are negative. Past Medical History:  Diagnosis Date   Allergy    Zyrtec, Benadryl   Anxiety    Zoloft since several years   Asthma    Balance problem    Cataract    Complication of anesthesia    GERD (gastroesophageal reflux disease)    Hyperlipidemia    Hypertension    Memory changes    Migraines    topmax and Imitrex   Past Surgical History:  Procedure Laterality Date   ABDOMINAL HYSTERECTOMY  2016   uterine prolapse; ovaries INTACT   BLADDER REPAIR     BREAST BIOPSY     NL   BREAST EXCISIONAL BIOPSY     BREAST SURGERY     breast bx x 2 in 1980s; benign   CARPAL TUNNEL RELEASE Bilateral    CATARACT EXTRACTION, BILATERAL  2018   TOTAL KNEE ARTHROPLASTY Left 10/01/2021   Procedure: LEFT TOTAL KNEE ARTHROPLASTY;  Surgeon: 10/03/2021, MD;  Location: [facility] OR;  Service: Orthopedics;  Laterality: Left;   Social History:  reports that she quit smoking about 71  years ago. Her smoking use included cigarettes. She has never used smokeless tobacco. She reports current alcohol use. She reports that she does not use drugs.  Allergies  Allergen Reactions   Anesthesia S-I-40 [Propofol] Other (See Comments)    Causing a migraine and irritability   Oxycontin [Oxycodone Hcl]     Causes migraines   Tetracyclines & Related Nausea And Vomiting   Vicodin  [Hydrocodone-Acetaminophen] Nausea And Vomiting    Dizzy, causes migraines    Family History  Problem Relation Age of Onset   Hypertension Mother    Stroke Mother    Dementia Mother    Cancer Father 73       oral cancer   Heart disease Father        PAD/femoral bypass   Hypertension Father    Hyperlipidemia Father    Alcohol abuse Father    Hyperlipidemia Sister    Hypertension Sister    Breast cancer Maternal Aunt     Prior to Admission medications   Medication Sig Start Date End Date Taking? Authorizing Provider  albuterol (VENTOLIN HFA) 108 (90 Base) MCG/ACT inhaler Inhale 2 puffs into the lungs every 6 (six) hours as needed for wheezing or shortness of breath. 11/17/19  Yes Lezlie Lye, Meda Coffee, MD  cetirizine (ZYRTEC) 10 MG tablet Take 10 mg by mouth daily.   Yes [provider]  diclofenac Sodium (VOLTAREN) 1 % GEL Apply 2 g topically daily as needed (achillles pain).   Yes [provider]  fluticasone (FLONASE) 50 MCG/ACT nasal spray Place 1 spray into both nostrils 2 (two) times daily. Patient taking differently: Place 1 spray into both nostrils daily as needed for allergies. 03/24/18  Yes Lezlie Lye, Meda Coffee, MD  hydrOXYzine (VISTARIL) 50 MG capsule Take 50 mg by mouth at bedtime. 08/31/21  Yes [provider]  metoprolol succinate (TOPROL-XL) 100 MG 24 hr tablet TAKE 1 TABLET EVERY DAY. TAKE WITH OR IMMEDIATELY FOLLOWING A MEAL. 10/27/19  Yes Lezlie Lye, Irma M, MD  ondansetron (ZOFRAN) 8 MG tablet Take 1 tablet (8 mg total) by mouth every 8 (eight) hours as needed for nausea or vomiting. 03/29/20  Yes Jaffe, Adam R, DO  rosuvastatin (CRESTOR) 10 MG tablet Take 10 mg by mouth at bedtime. 06/24/21  Yes [provider]  sertraline (ZOLOFT) 100 MG tablet TAKE 1 AND 1/2 TABLETS EVERY DAY Patient taking differently: Take 200 mg by mouth daily. TAKE 1 AND 1/2 TABLETS EVERY DAY 10/27/19  Yes Lezlie Lye, Meda Coffee, MD  SUMAtriptan [facility]) 100  MG tablet May repeat in 2 hours if headache persists or recurs. 08/12/21  Yes Penumalli, Glenford Bayley, MD  SYMBICORT 80-4.5 MCG/ACT inhaler Inhale 2 puffs into the lungs 2 (two) times daily as needed (shortness of breath). 03/26/20  Yes [provider]  topiramate (TOPAMAX) 50 MG tablet Take 1 tablet (50 mg total) by mouth 2 (two) times daily. Patient taking differently: Take 50 mg by mouth at bedtime. 08/12/21  Yes Penumalli, Glenford Bayley, MD  vitamin B-12 (CYANOCOBALAMIN) 500 MCG tablet Take 1,000 mcg by mouth daily.   Yes [provider]  acetaminophen (TYLENOL) 325 MG tablet Take 1-2 tablets (325-650 mg total) by mouth every 6 (six) hours as needed for mild pain (pain score 1-3 or temp > 100.5). 10/02/21   Magnant, Joycie Peek, PA-C  aspirin 81 MG chewable tablet Chew 1 tablet (81 mg total) by mouth 2 (two) times daily. 10/02/21   Magnant, Joycie Peek, PA-C  celecoxib (CELEBREX) 100 MG capsule Take 1 capsule (100 mg total) by mouth 2 (two) times daily. 10/02/21   Magnant, Charles L, PA-C  famotidine (PEPCID) 20 MG tablet Take 1 tablet (20 mg total) by mouth 2 (two) times daily. Patient not taking: Reported on 09/20/2021 11/17/19   Lezlie Lye, Meda Coffee, MD  HYDROmorphone (DILAUDID) 2 MG tablet Take 0.5 tablets (1 mg total) by mouth every 4 (four) hours as needed for moderate pain (pains score 4-6). 10/02/21   Magnant, Joycie Peek, PA-C    Physical Exam: Vitals:   10/03/21 1439 10/03/21 1949 10/04/21 0403 10/04/21 0806  BP: 138/77 135/85 126/76 122/73  Pulse: 91 94 91 87  Resp: 17 18 18 17   Temp: 99 F (37.2 C) 98.7 F (37.1 C) 98.2 F (36.8 C) 98.3 F (36.8 C)  TempSrc: Oral Oral  Oral  SpO2: 97% 95% 95% 95%  Weight:      Height:       Physical Exam Vitals reviewed.  Constitutional:      Appearance: Normal appearance.     Comments: Sleepy,but will answer questions   HENT:     Head: Normocephalic and atraumatic.     Nose: Nose normal.     Mouth/Throat:     Mouth: Mucous membranes  are dry.  Eyes:     Extraocular Movements: Extraocular movements intact.     Conjunctiva/sclera: Conjunctivae normal.     Pupils: Pupils are equal, round, and reactive to light.  Cardiovascular:     Rate and Rhythm: Normal rate and regular rhythm.     Heart sounds: Normal heart sounds.  Pulmonary:     Effort: Pulmonary effort is normal.     Breath sounds: Normal breath sounds. No wheezing, rhonchi or rales.  Abdominal:     General: Abdomen is flat. Bowel sounds are normal.     Palpations: Abdomen is soft.  Musculoskeletal:        General: Normal range of motion.     Cervical back: Normal range of motion and neck supple.     Comments: Left knee with bandage. No erythema or drainage from incision   Skin:    General: Skin is warm.     Capillary Refill: Capillary refill takes less than 2 seconds.  Neurological:     General: No focal deficit present.     Mental Status: She is disoriented.     Cranial Nerves: No cranial nerve deficit.     Sensory: No sensory deficit.     Deep Tendon Reflexes: Reflexes normal.     Comments: She knows place/name. Doesn't recall her birthday year or her husband  Knows location   Psychiatric:     Comments: Denies any current AH/VH      Data Reviewed:  Labs ordered including cbc, cmp, b12, tsh, ammonia, ua and urine culture CXR and CT head ordered   Family Communication: husband at bedside:  Primary team communication: Ledell Noss PA  Thank you very much for involving Franky Macho in the care of your patient.  Author: [place], MD 10/04/2021 12:04 PM  For on call review [email or web address].

## 2021-10-04 NOTE — Assessment & Plan Note (Signed)
Has chronic migraines, no change in these Recent brain MRI on 5/30 with no acute finding  Continue outpatient f/u with neurology and home medication

## 2021-10-05 ENCOUNTER — Inpatient Hospital Stay (HOSPITAL_COMMUNITY): Payer: Medicare HMO

## 2021-10-05 DIAGNOSIS — F419 Anxiety disorder, unspecified: Secondary | ICD-10-CM | POA: Diagnosis not present

## 2021-10-05 DIAGNOSIS — G9340 Encephalopathy, unspecified: Secondary | ICD-10-CM | POA: Diagnosis not present

## 2021-10-05 DIAGNOSIS — R443 Hallucinations, unspecified: Secondary | ICD-10-CM

## 2021-10-05 DIAGNOSIS — Z96652 Presence of left artificial knee joint: Secondary | ICD-10-CM

## 2021-10-05 DIAGNOSIS — I10 Essential (primary) hypertension: Secondary | ICD-10-CM | POA: Diagnosis not present

## 2021-10-05 DIAGNOSIS — G43709 Chronic migraine without aura, not intractable, without status migrainosus: Secondary | ICD-10-CM | POA: Diagnosis not present

## 2021-10-05 DIAGNOSIS — E7800 Pure hypercholesterolemia, unspecified: Secondary | ICD-10-CM

## 2021-10-05 DIAGNOSIS — F32A Depression, unspecified: Secondary | ICD-10-CM

## 2021-10-05 DIAGNOSIS — F05 Delirium due to known physiological condition: Secondary | ICD-10-CM

## 2021-10-05 LAB — BASIC METABOLIC PANEL
Anion gap: 7 (ref 5–15)
BUN: 12 mg/dL (ref 8–23)
CO2: 22 mmol/L (ref 22–32)
Calcium: 8.8 mg/dL — ABNORMAL LOW (ref 8.9–10.3)
Chloride: 108 mmol/L (ref 98–111)
Creatinine, Ser: 0.59 mg/dL (ref 0.44–1.00)
GFR, Estimated: >60 mL/min (ref >60)
Glucose, Bld: 112 mg/dL — ABNORMAL HIGH (ref 70–99)
Potassium: 3.5 mmol/L (ref 3.5–5.1)
Sodium: 137 mmol/L (ref 135–145)

## 2021-10-05 LAB — URINE CULTURE: Culture: <10000 — AB

## 2021-10-05 NOTE — Progress Notes (Signed)
Physical Therapy Treatment Patient Details Name: Caroline [name] MRN: [id number] DOB: 16-Jul-1950 Today's Date: 10/05/2021   History of Present Illness Pt is a 71yo F s/p L TKA on 6/27. PMH: HTN, osteopenia, asthma    PT Comments    Pt alert and oriented this session however demonstrated difficulty with command following and increased time to initiate movement. Pt son present this session, states pt had minor instances of STM deficits but nothing similar to what pt is currently experiencing. Pt able to ambulate 26ft with RW and min guard as well as one seated rest break. Pt limited this session by cognitive status. Would continue to benefit from acute PT to progress gait and overall functional independence as cognition improves.     Recommendations for follow up therapy are one component of a multi-disciplinary discharge planning process, led by the attending physician.  Recommendations may be updated based on patient status, additional functional criteria and insurance authorization.  Follow Up Recommendations  Follow physician's recommendations for discharge plan and follow up therapies     Assistance Recommended at Discharge Frequent or constant Supervision/Assistance  Patient can return home with the following A little help with walking and/or transfers;A little help with bathing/dressing/bathroom;Assistance with cooking/housework;Assist for transportation;Help with stairs or ramp for entrance   Equipment Recommendations  None recommended by PT    Recommendations for Other Services       Precautions / Restrictions Precautions Precautions: Fall;Knee Precaution Booklet Issued: Yes (comment) Precaution Comments: no pillow under knee Required Braces or Orthoses: Knee Immobilizer - Left Knee Immobilizer - Left: Discontinue once straight leg raise with < 10 degree lag Restrictions Weight Bearing Restrictions: Yes LLE Weight Bearing: Weight bearing as tolerated     Mobility  Bed  Mobility Overal bed mobility: Needs Assistance Bed Mobility: Supine to Sit, Sit to Supine     Supine to sit: Min guard Sit to supine: Min assist   General bed mobility comments: min guard for safety and increased time to sit to EOB. MinA for LLE to return to supine    Transfers Overall transfer level: Needs assistance Equipment used: Rolling walker (2 wheels) Transfers: Sit to/from Stand Sit to Stand: Min guard, Min assist           General transfer comment: min guard for safety with occasional minA for power up and maintaining balance    Ambulation/Gait Ambulation/Gait assistance: Min guard, Min assist Gait Distance (Feet): 80 Feet Assistive device: Rolling walker (2 wheels) Gait Pattern/deviations: Step-to pattern, Decreased step length - right, Decreased stance time - left, Antalgic, Trunk flexed Gait velocity: decreased Gait velocity interpretation: <1.31 ft/sec, indicative of household ambulator   General Gait Details: min guard for safety, minA to advance RW to cue for longer step length. VCs for upright posture. Requiring increased time to initiate gait. Utilized chair follow for seated rest break   Social research officer, government Rankin (Stroke Patients Only)       Balance Overall balance assessment: Needs assistance Sitting-balance support: No upper extremity supported, Feet supported Sitting balance-Leahy Scale: Good     Standing balance support: Bilateral upper extremity supported, During functional activity Standing balance-Leahy Scale: Poor Standing balance comment: reliant on RW for support                            Cognition Arousal/Alertness: Lethargic Behavior During Therapy: Flat affect Overall  Cognitive Status: Impaired/Different from baseline Area of Impairment: Attention, Memory, Following commands, Safety/judgement, Awareness, Problem solving                   Current Attention Level:  Sustained Memory: Decreased short-term memory, Decreased recall of precautions Following Commands: Follows one step commands inconsistently Safety/Judgement: Decreased awareness of safety, Decreased awareness of deficits Awareness: Emergent Problem Solving: Slow processing, Decreased initiation, Difficulty sequencing, Requires tactile cues General Comments: oriented to time, place, and situation. increased time for initiation, difficulty following verbal commands, following tacticle cues inconsistently.        Exercises Total Joint Exercises Ankle Circles/Pumps: AAROM, Both, 5 reps, Supine Straight Leg Raises: AAROM, Left, 5 reps, Supine    General Comments        Pertinent Vitals/Pain Pain Assessment Pain Assessment: Faces Faces Pain Scale: Hurts little more Pain Location: L knee Pain Descriptors / Indicators: Grimacing, Operative site guarding Pain Intervention(s): Limited activity within patient's tolerance, Monitored during session, Ice applied    Home Living                          Prior Function            PT Goals (current goals can now be found in the care plan section) Acute Rehab PT Goals Patient Stated Goal: to return home PT Goal Formulation: With patient Time For Goal Achievement: 10/15/21 Potential to Achieve Goals: Good Progress towards PT goals: Progressing toward goals    Frequency    7X/week      PT Plan Current plan remains appropriate    Co-evaluation              AM-PAC PT "6 Clicks" Mobility   Outcome Measure  Help needed turning from your back to your side while in a flat bed without using bedrails?: A Little Help needed moving from lying on your back to sitting on the side of a flat bed without using bedrails?: A Little Help needed moving to and from a bed to a chair (including a wheelchair)?: A Little Help needed standing up from a chair using your arms (e.g., wheelchair or bedside chair)?: A Little Help needed to  walk in hospital room?: A Little Help needed climbing 3-5 steps with a railing? : A Lot 6 Click Score: 17    End of Session Equipment Utilized During Treatment: Gait belt;Left knee immobilizer Activity Tolerance: Patient limited by lethargy;Other (comment) (confusion) Patient left: in bed;with call bell/phone within reach;with family/visitor present;with bed alarm set Nurse Communication: Mobility status PT Visit Diagnosis: Other abnormalities of gait and mobility (R26.89);Difficulty in walking, not elsewhere classified (R26.2);Pain Pain - Right/Left: Left Pain - part of body: Knee     Time: 3557-3220 PT Time Calculation (min) (ACUTE ONLY): 41 min  Charges:  $Gait Training: 23-37 mins $Therapeutic Exercise: 8-22 mins                     Davina Poke, SPT Acute Rehabilitation Services  Office: [phone]    Davina Poke 10/05/2021, 10:47 AM

## 2021-10-05 NOTE — Progress Notes (Signed)
  Subjective: Caroline [name] is a 71 y.o. female s/p left TKA.  They are POD 3.  Pt's pain is controlled.  Pt denies numbness/tingling/weakness.  Pt has ambulated with significant difficulty.  She has been unstable and confused when working with physical therapy.  This morning, she is slow to respond and continually closes her eyes and drifts off during conversation.  She does not really answer questions with any responses longer than 1 or 2 words but she is able to follow commands to some degree when she is more awake.  She has a history of headaches and states she does have a headache this morning but it is not severe.  No chest pain or trouble breathing.  Objective: Vital signs in last 24 hours: Temp:  [98 F (36.7 C)-98.9 F (37.2 C)] 98 F (36.7 C) (07/01 0747) Pulse Rate:  [76-90] 80 (07/01 0747) Resp:  [16-18] 17 (07/01 0747) BP: (110-128)/(68-75) 128/75 (07/01 0747) SpO2:  [96 %-97 %] 97 % (07/01 0747)  Intake/Output from previous day: No intake/output data recorded. Intake/Output this shift: No intake/output data recorded.  Exam:  No gross blood or drainage overlying the dressing 2+ DP pulse Able to dorsiflex and plantarflex the left foot No calf tenderness.  Negative Homans' sign.  Labs: Recent Labs    10/04/21 1120  HGB 8.3*   Recent Labs    10/04/21 1120  WBC 7.7  RBC 2.75*  HCT 25.0*  PLT 188   Recent Labs    10/04/21 1120 10/05/21 0147  NA 137 137  K 3.2* 3.5  CL 106 108  CO2 22 22  BUN 11 12  CREATININE 0.59 0.59  GLUCOSE 124* 112*  CALCIUM 8.8* 8.8*   No results for input(s): "LABPT", "INR" in the last 72 hours.  Assessment/Plan: Pt is POD 3 s/p left TKA.    -Weightbearing as tolerated with walker.  -Continue PT evaluation  -Discontinue all opioid medication with patient's continued confusion and sedation.  -Hospitalist team consulted and appreciate their input for her altered mental status.   Maylon Sailors L Dustin Burrill 10/05/2021, 8:27 AM

## 2021-10-05 NOTE — Progress Notes (Signed)
Physical Therapy Treatment Patient Details Name: Caroline [name] MRN: [id number] DOB: 1950-05-06 Today's Date: 10/05/2021   History of Present Illness Pt is a 71yo F s/p L TKA on 6/27. PMH: HTN, osteopenia, asthma    PT Comments    Pt demonstrated increased tolerance to activity this session but remains limited by confusion and overall cognitive status. Pt able to ambulated 167ft min guard with RW but required significant cueing to find her way back to the room. Educated pts husband on donning knee immobilizer and he was able to demonstrate understanding. Pt would continue to benefit from acute PT to address deficits in strength, balance, and ROM.    Recommendations for follow up therapy are one component of a multi-disciplinary discharge planning process, led by the attending physician.  Recommendations may be updated based on patient status, additional functional criteria and insurance authorization.  Follow Up Recommendations  Follow physician's recommendations for discharge plan and follow up therapies     Assistance Recommended at Discharge Frequent or constant Supervision/Assistance  Patient can return home with the following A little help with walking and/or transfers;A little help with bathing/dressing/bathroom;Assistance with cooking/housework;Assist for transportation;Help with stairs or ramp for entrance   Equipment Recommendations  None recommended by PT    Recommendations for Other Services       Precautions / Restrictions Precautions Precautions: Fall;Knee Precaution Booklet Issued: Yes (comment) Precaution Comments: no pillow under knee Required Braces or Orthoses: Knee Immobilizer - Left Knee Immobilizer - Left: Discontinue once straight leg raise with < 10 degree lag Restrictions Weight Bearing Restrictions: Yes LLE Weight Bearing: Weight bearing as tolerated     Mobility  Bed Mobility Overal bed mobility: Needs Assistance Bed Mobility: Supine to Sit      Supine to sit: Min guard Sit to supine: Min assist   General bed mobility comments: min guard for safety and increased time to sit to EOB. MinA for LLE to return to supine    Transfers Overall transfer level: Needs assistance Equipment used: Rolling walker (2 wheels) Transfers: Sit to/from Stand Sit to Stand: Min guard           General transfer comment: min guard for safety, decreased safety awareness with hand placement    Ambulation/Gait Ambulation/Gait assistance: Min guard, Min assist Gait Distance (Feet): 150 Feet Assistive device: Rolling walker (2 wheels) Gait Pattern/deviations: Step-to pattern, Decreased step length - right, Decreased stance time - left, Antalgic, Trunk flexed Gait velocity: decreased Gait velocity interpretation: <1.8 ft/sec, indicate of risk for recurrent falls   General Gait Details: min guard for safety, occasional minA for minor LOBs with turns. Requiring significant cueing to find her way back to the room with multiple reminders for room number. Pt kept repeating back the wrong room number   Stairs             Wheelchair Mobility    Modified Rankin (Stroke Patients Only)       Balance Overall balance assessment: Needs assistance Sitting-balance support: No upper extremity supported, Feet supported Sitting balance-Leahy Scale: Good     Standing balance support: Bilateral upper extremity supported, During functional activity Standing balance-Leahy Scale: Poor Standing balance comment: reliant on RW for support, minor LOBs with turns                            Cognition Arousal/Alertness: Awake/alert Behavior During Therapy: Flat affect Overall Cognitive Status: Impaired/Different from baseline Area of Impairment: Attention, Memory, Following  commands, Safety/judgement, Awareness, Problem solving                   Current Attention Level: Sustained Memory: Decreased short-term memory, Decreased recall of  precautions Following Commands: Follows one step commands inconsistently Safety/Judgement: Decreased awareness of safety, Decreased awareness of deficits Awareness: Emergent Problem Solving: Slow processing, Decreased initiation, Difficulty sequencing, Requires tactile cues General Comments: difficulty remembering room number multiple times with significant cueing. Difficulty with way finding and repeated cues required for direction        Exercises Total Joint Exercises Ankle Circles/Pumps: AAROM, Both, 5 reps, Supine Straight Leg Raises: AAROM, Left, 5 reps, Supine Goniometric ROM: 6-53    General Comments        Pertinent Vitals/Pain Pain Assessment Pain Assessment: 0-10 Pain Score: 6  Faces Pain Scale: Hurts little more Pain Location: L knee Pain Descriptors / Indicators: Grimacing, Operative site guarding Pain Intervention(s): Limited activity within patient's tolerance, Monitored during session, Ice applied    Home Living                          Prior Function            PT Goals (current goals can now be found in the care plan section) Acute Rehab PT Goals Patient Stated Goal: to return home PT Goal Formulation: With patient Time For Goal Achievement: 10/15/21 Potential to Achieve Goals: Good Progress towards PT goals: Progressing toward goals    Frequency    7X/week      PT Plan Current plan remains appropriate    Co-evaluation              AM-PAC PT "6 Clicks" Mobility   Outcome Measure  Help needed turning from your back to your side while in a flat bed without using bedrails?: A Little Help needed moving from lying on your back to sitting on the side of a flat bed without using bedrails?: A Little Help needed moving to and from a bed to a chair (including a wheelchair)?: A Little Help needed standing up from a chair using your arms (e.g., wheelchair or bedside chair)?: A Little Help needed to walk in hospital room?: A  Little Help needed climbing 3-5 steps with a railing? : A Lot 6 Click Score: 17    End of Session Equipment Utilized During Treatment: Gait belt;Left knee immobilizer Activity Tolerance: Patient tolerated treatment well;Other (comment) (confusion) Patient left: in chair;with call bell/phone within reach;with family/visitor present Nurse Communication: Mobility status PT Visit Diagnosis: Other abnormalities of gait and mobility (R26.89);Difficulty in walking, not elsewhere classified (R26.2);Pain Pain - Right/Left: Left Pain - part of body: Knee     Time: 6712-4580 PT Time Calculation (min) (ACUTE ONLY): 28 min  Charges:  $Gait Training: 8-22 mins $Therapeutic Activity: 8-22 mins                    Davina Poke, SPT Acute Rehabilitation Services  Office: [phone]    Davina Poke 10/05/2021, 4:55 PM

## 2021-10-05 NOTE — Progress Notes (Signed)
PROGRESS NOTE    Caroline [name]  [id number] DOB: 09/19/1950 DOA: 10/01/2021 PCP: Caren Macadam, MD    Brief Narrative:  Caroline [name] is a 71 y.o. female with past medical history of hyperlipidemia, hypertension, GERD, asthma, depression and anxiety, chronic migraine, mild cognitive dysfunction presented to hospital for left knee replacement on 10/01/2021.  Postoperatively patient had some hallucinations and confusion and disorientation so medical team was consulted for further evaluation.     Assessment and Plan: Principal Problem:   Acute encephalopathy/delirium  Active Problems:   S/P total knee arthroplasty, left   Pure hypercholesterolemia   Anxiety and depression   Chronic migraine without aura without status migrainosus, not intractable   Essential hypertension, benign   * Acute metabolic encephalopathy/delirium  Could be postoperative delirium/use of opiates/polypharmacy with underlying baseline cognitive dysfunction..  Currently Dilaudid has been discontinued and is on tramadol for pain relief.  Continue Tylenol.  Patient likely has baseline cognitive dysfunction and history of prolonged anesthesia in family members.  Chest x-ray without acute findings.  Urinalysis was negative.  Ammonia slightly elevated but LFTs within normal limits.  TSH within normal limit.  Vitamin B12 within normal limits.  Continue delirium precautions.  Continue normal saline at 75 mL/h.  Encourage incentive spirometry.  No obvious source of infection.  Continue supportive care.  MRI of the brain has been ordered by the primary team.  We will discontinue Robaxin and loratadine for now.  S/P total knee arthroplasty, left Postoperative status.  As per primary team.  Essential hypertension, benign Continue Toprol-XL  Chronic migraine without aura without status migrainosus, not intractable History of chronic migraine and follows up with neurology as outpatient.  MRI done 09/03/2021 without any acute  findings.  Anxiety and depression Continue zoloft daily   Pure hypercholesterolemia Continue crestor    DVT prophylaxis: SCDs Start: 10/01/21 1603 Place TED hose Start: 10/01/21 1603   Code Status:     Code Status: Full Code  Disposition: As per PT and primary team.  Status is: Inpatient  Remains inpatient appropriate because: Confusion, possible delirium,   Family Communication: Communicated with the patient's son at bedside.  Procedures:  Left total knee arthroplasty by orthopedics   Antimicrobials:  None  Anti-infectives (From admission, onward)    Start     Dose/Rate Route Frequency Ordered Stop   10/01/21 1930  ceFAZolin (ANCEF) IVPB 1 g/50 mL premix        1 g 100 mL/hr over 30 Minutes Intravenous Every 8 hours 10/01/21 1602 10/02/21 0411   10/01/21 1246  vancomycin (VANCOCIN) powder  Status:  Discontinued          As needed 10/01/21 1246 10/01/21 1406   10/01/21 0915  ceFAZolin (ANCEF) IVPB 2g/100 mL premix        2 g 200 mL/hr over 30 Minutes Intravenous On call to O.R. 10/01/21 0907 10/01/21 1136        Subjective: Today, patient was seen and examined at bedside.  Patient's son at bedside.  Son states that she is little confused and disoriented at times.  Denies overt pain, nausea, vomiting.  Has mild baseline cognitive dysfunction and history of prolonged anesthesia in the family members.  Objective: Vitals:   10/04/21 0806 10/04/21 1415 10/04/21 2010 10/05/21 0747  BP: 122/73 110/68 111/74 128/75  Pulse: 87 76 90 80  Resp: 17 16 18 17   Temp: 98.3 F (36.8 C) 98.7 F (37.1 C) 98.9 F (37.2 C) 98 F (36.7 C)  TempSrc: Oral  Oral Oral Oral  SpO2: 95% 96% 96% 97%  Weight:      Height:        Intake/Output Summary (Last 24 hours) at 10/05/2021 1040 Last data filed at 10/05/2021 0900 Gross per 24 hour  Intake 120 ml  Output --  Net 120 ml   Filed Weights   10/01/21 0921  Weight: 63.9 kg    Physical Examination: Body mass index is 25.77  kg/m.  General:  Average built, not in obvious distress alert awake and communicative HENT:   No scleral pallor or icterus noted. Oral mucosa is moist.  Chest:  Clear breath sounds.  Diminished breath sounds bilaterally. No crackles or wheezes.  CVS: S1 &S2 heard. No murmur.  Regular rate and rhythm. Abdomen: Soft, nontender, nondistended.  Bowel sounds are heard.   Extremities: No cyanosis, clubbing or edema.  Peripheral pulses are palpable.  Status post total knee arthroplasty. Psych: Alert, awake and communicative but confused at times, oriented to place but disoriented to person and time. CNS:  No cranial nerve deficits.  Skin: Warm and dry.  No rashes noted.  Data Reviewed:   CBC: Recent Labs  Lab 10/04/21 1120  WBC 7.7  NEUTROABS 5.9  HGB 8.3*  HCT 25.0*  MCV 90.9  PLT 188    Basic Metabolic Panel: Recent Labs  Lab 10/04/21 1120 10/04/21 1202 10/05/21 0147  NA 137  --  137  K 3.2*  --  3.5  CL 106  --  108  CO2 22  --  22  GLUCOSE 124*  --  112*  BUN 11  --  12  CREATININE 0.59  --  0.59  CALCIUM 8.8*  --  8.8*  MG  --  2.0  --     Liver Function Tests: Recent Labs  Lab 10/04/21 1120  AST 18  ALT 14  ALKPHOS 58  BILITOT 0.7  PROT 6.2*  ALBUMIN 3.3*     Radiology Studies: CT HEAD WO CONTRAST ( )  Result Date: 10/04/2021 CLINICAL DATA:  Altered mental status EXAM: CT HEAD WITHOUT CONTRAST TECHNIQUE: Contiguous axial images were obtained from the base of the skull through the vertex without intravenous contrast. RADIATION DOSE REDUCTION: This exam was performed according to the departmental dose-optimization program which includes automated exposure control, adjustment of the mA and/or kV according to patient size and/or use of iterative reconstruction technique. COMPARISON:  MR 08/31/2021 and previous FINDINGS: Brain: Diffuse parenchymal atrophy. Patchy areas of hypoattenuation in deep and periventricular white matter bilaterally. Negative for acute  intracranial hemorrhage, mass lesion, acute infarction, midline shift, or mass-effect. Acute infarct may be inapparent on noncontrast CT. Ventricles and sulci symmetric. Vascular: No hyperdense vessel or unexpected calcification. Skull: Normal. Negative for fracture or focal lesion. Sinuses/Orbits: No acute finding. Other: None IMPRESSION: 1. Negative for bleed or other acute intracranial process. 2. Atrophy and nonspecific white matter changes. Electronically Signed   By: Corlis Leak M.D.   On: 10/04/2021 12:15   DG CHEST PORT 1 VIEW  Result Date: 10/04/2021 CLINICAL DATA:  Altered mental status, acute encephalopathy. EXAM: PORTABLE CHEST 1 VIEW COMPARISON:  Chest radiograph 12/28/2016 FINDINGS: Decreased lung volumes. Heart size is prominent and likely related to the low lung volumes. Subtle densities at the lateral left lung base and there is a focal linear or bandlike density at the right lung base. Lung base findings are suggestive for atelectasis. Negative for a pneumothorax. IMPRESSION: 1. Low lung volumes. 2. Bibasilar lung densities are most compatible  with atelectasis. Electronically Signed   By: Richarda Overlie M.D.   On: 10/04/2021 11:24      LOS: 1 day    Joycelyn Das, MD [facility] Available via Epic secure chat 7am-7pm After these hours, please refer to coverage provider listed on [email or web address] 10/05/2021, 10:40 AM

## 2021-10-05 NOTE — Progress Notes (Signed)
  Subjective: Patient doing reasonably well pain wise but remains confused and is unable to tell us where she is in terms of hospital location.  Medical work-up thus far negative for infection and CT scan head shows no acute intracranial abnormalities  Patient did have recent MRI of her brain due to migraines. She has been off narcotic pain medicine starting yesterday morning with no significant improvement in her mental status per family as well as per my evaluation this morning   Objective: Vital signs in last 24 hours: Temp:  [98 F (36.7 C)-98.9 F (37.2 C)] 98 F (36.7 C) (07/01 0747) Pulse Rate:  [76-90] 80 (07/01 0747) Resp:  [16-18] 17 (07/01 0747) BP: (110-128)/(68-75) 128/75 (07/01 0747) SpO2:  [96 %-97 %] 97 % (07/01 0747)  Intake/Output from previous day: No intake/output data recorded. Intake/Output this shift: No intake/output data recorded.  Exam:  Dorsiflexion/Plantar flexion intact  Labs: Recent Labs    10/04/21 1120  HGB 8.3*   Recent Labs    10/04/21 1120  WBC 7.7  RBC 2.75*  HCT 25.0*  PLT 188   Recent Labs    10/04/21 1120 10/05/21 0147  NA 137 137  K 3.2* 3.5  CL 106 108  CO2 22 22  BUN 11 12  CREATININE 0.59 0.59  GLUCOSE 124* 112*  CALCIUM 8.8* 8.8*   No results for input(s): "LABPT", "INR" in the last 72 hours.  Assessment/Plan: Plan at this time is neurology consult with MRI brain.  Differential diagnosis for postop confusion would be in her case #1 Dilaudid related confusion,, currently only on tramadol - may be difficult to manage post op pain on tylenol and nsaids and mr alone #2 unmasking of underlying memory loss issues which was not really present prior to this hospital admission #3 cause to be determined. Hemoglobin 8.3 but that does not really appear to be a factor yet. Appreciate internal medicine consultation and initial work-up. Based on lab work does not look like infection or metabolic problem.  Notably her father does  have a history of having issues coming out from anesthesia..  Patient did have spinal anesthetic so that makes this slightly less likely.    G Scott Sonna Lipsky 10/05/2021, 9:12 AM

## 2021-10-05 NOTE — Progress Notes (Signed)
Orthopedic Tech Progress Note Patient Details:  Caroline [name] 1950/06/21 [id number]  CPM Left Knee CPM Left Knee: On Left Knee Flexion (Degrees): 50 Left Knee Extension (Degrees): 10  Post Interventions Patient Tolerated: Well Instructions Provided: Care of device  [name] Post 10/05/2021, 8:35 PM

## 2021-10-05 NOTE — Consult Note (Addendum)
Neurology Consultation  Reason for Consult: AMS  Referring Physician: Dr. August Saucer   CC: Confusion and hallucinations   History is obtained from:husband and medical record   HPI: Caroline [name] is a 71 y.o. female with past medical history of HTN, HLD, GERD, asthma, depression and anxiety, chronic migraine, mild cognitive dysfunction who was admitted for a total knee arthroplasty on 6/27. She has been confused since 6/29. Last received dilaudid on 6/29, tramadol on 6/30, robaxin 6/28, Neurology consulted.   Husband is at bedside and provides history. She has been having short term memory issues as well as recent onset of visual hallucinations of ghosts at her home and has been seen by Dr. Marjory Lies of [facility] for these complaints.   Husband states that her problems with hallucinations and confusion started after she recently began taking Topamax for migraine prevention. The cognitive symptoms and hallucinations became particularly prominent after her operation her at [facility], but were present prior to admission.   MRI brain on 08/31/2021 with no acute process, Moderate chronic white matter microangiopathy. Recent workup- TSH 1.305, ammonia 41, Vit B 12 653, UA negative, CXR with no acute process  While here, following her knee surgery, she has been confused and has had more intense hallucinations than usual. However, husband endorses improvement in patient confusion today "this is the best she has been since her operation". Patient is aware of her confusion, and tells me that this morning she was confused while watching [place]. There was a school bus in some scenes of the TV show - she got upset because she hallucinated that she "missed the bus" - she believed the bus was for her. She states she feels better at this moment and realizes that she had a hallucination.   On exam she is awake , alert and oriented x 4. She is able to tell me her full name, correct age, month, year,  place, city and reason for being in the hospital. No facial droop, aphasia or dysarthria noted. She is able to follow commands, move all extremities equally, except left leg due to pain from surgery, strength seems symmetric. No neck stiffness or meningismus.   ROS: Full ROS was performed and is negative except as noted in the HPI.    Past Medical History:  Diagnosis Date   Allergy    Zyrtec, Benadryl   Anxiety    Zoloft since several years   Asthma    Balance problem    Cataract    Complication of anesthesia    GERD (gastroesophageal reflux disease)    Hyperlipidemia    Hypertension    Memory changes    Migraines    topmax and Imitrex     Family History  Problem Relation Age of Onset   Hypertension Mother    Stroke Mother    Dementia Mother    Cancer Father 75       oral cancer   Heart disease Father        PAD/femoral bypass   Hypertension Father    Hyperlipidemia Father    Alcohol abuse Father    Hyperlipidemia Sister    Hypertension Sister    Breast cancer Maternal Aunt      Social History:   reports that she quit smoking about 53 years ago. Her smoking use included cigarettes. She has never used smokeless tobacco. She reports current alcohol use. She reports that she does not use drugs.  Medications  Current Facility-Administered Medications:    0.9 %  sodium chloride infusion, , Intravenous, Continuous, Magnant, Charles L, PA-C   acetaminophen (TYLENOL) tablet 325-650 mg, 325-650 mg, Oral, Q6H PRN, Magnant, Charles L, PA-C, 650 mg at 10/05/21 1108   albuterol (PROVENTIL) (2.5 MG/3ML) 0.083% nebulizer solution 2.5 mg, 2.5 mg, Inhalation, Q6H PRN, Magnant, Charles L, PA-C   aspirin chewable tablet 81 mg, 81 mg, Oral, BID, Magnant, Charles L, PA-C, 81 mg at 10/05/21 1109   celecoxib (CELEBREX) capsule 100 mg, 100 mg, Oral, BID, Magnant, Charles L, PA-C, 100 mg at 10/05/21 1109   docusate sodium (COLACE) capsule 100 mg, 100 mg, Oral, BID, Magnant, Charles L,  PA-C, 100 mg at 10/05/21 1110   famotidine (PEPCID) tablet 20 mg, 20 mg, Oral, BID, Magnant, Charles L, PA-C, 20 mg at 10/05/21 1109   gabapentin (NEURONTIN) capsule 100 mg, 100 mg, Oral, BID, Magnant, Charles L, PA-C, 100 mg at 10/05/21 1110   menthol-cetylpyridinium (CEPACOL) lozenge 3 mg, 1 lozenge, Oral, PRN **OR** phenol (CHLORASEPTIC) mouth spray 1 spray, 1 spray, Mouth/Throat, PRN, Magnant, Charles L, PA-C   metoCLOPramide (REGLAN) tablet 5-10 mg, 5-10 mg, Oral, Q8H PRN **OR** metoCLOPramide (REGLAN) injection 5-10 mg, 5-10 mg, Intravenous, Q8H PRN, Magnant, Charles L, PA-C   metoprolol succinate (TOPROL-XL) 24 hr tablet 100 mg, 100 mg, Oral, Daily, Magnant, Charles L, PA-C, 100 mg at 10/05/21 1108   ondansetron (ZOFRAN) tablet 4 mg, 4 mg, Oral, Q6H PRN **OR** ondansetron (ZOFRAN) injection 4 mg, 4 mg, Intravenous, Q6H PRN, Magnant, Charles L, PA-C   sertraline (ZOLOFT) tablet 150 mg, 150 mg, Oral, Daily, Magnant, Charles L, PA-C, 150 mg at 10/05/21 1109   topiramate (TOPAMAX) tablet 50 mg, 50 mg, Oral, BID, Magnant, Charles L, PA-C, 50 mg at 10/05/21 1110   Exam: Current vital signs: BP 128/75 (BP Location: Left Arm)   Pulse 80   Temp 98 F (36.7 C) (Oral)   Resp 17   Ht 5\' 2"  (1.575 m)   Wt 63.9 kg   SpO2 97%   BMI 25.77 kg/m  Vital signs in last 24 hours: Temp:  [98 F (36.7 C)-98.9 F (37.2 C)] 98 F (36.7 C) (07/01 0747) Pulse Rate:  [76-90] 80 (07/01 0747) Resp:  [16-18] 17 (07/01 0747) BP: (110-128)/(68-75) 128/75 (07/01 0747) SpO2:  [96 %-97 %] 97 % (07/01 0747)  GENERAL: Awake, alert in NAD HEENT: - Normocephalic and atraumatic, dry mm LUNGS - Clear to auscultation bilaterally with no wheezes CV - S1S2 RRR, no m/r/g, equal pulses bilaterally. ABDOMEN - Soft, nontender, nondistended with normoactive BS Ext: left knee with bandage, warm, well perfused, intact peripheral pulses, no edema  NEURO:  Mental Status: awake , alert and oriented x 4. She is able to tell  me her full name, correct age, month, year, place, city and reason for being in the hospital. No facial droop, aphasia or dysarthria noted. She is able to follow commands, move all extremities equally, except left leg due to pain from surgery, strength seems symmetric. On follow up attending exam, mild memory impairment as well as mild difficulty with orientation questions was noted, but otherwise with fluent speech and normal affect without agitation or evidence for ongoing hallucination.  Cranial Nerves: PERRL, EOMI, visual fields full to finger count, no facial asymmetry, facial sensation intact, hearing intact, tongue/uvula/soft palate midline, normal sternocleidomastoid and trapezius muscle strength. No evidence of tongue atrophy or fibrillations Motor: 5/5 in bilateral uppers and right lower, left lower 4/5 due to recent surgery Tone: is normal and bulk is normal Sensation-  Intact to light touch bilaterally Coordination: FTN intact bilaterally, no ataxia in BLE. Gait- deferred    Imaging I have reviewed the images obtained:  CT-head- no acute process. Atrophy and nonspecific white matter changes  Assessment: 71 y.o. female with past medical history of HTN, HLD, GERD, asthma, depression and anxiety, chronic migraine, mild cognitive dysfunction who was admitted for a total knee arthroplasty on 6/27. She has been confused since 6/29. Last received dilaudid on 6/29, tramadol on 6/30, robaxin 6/28. - On exam she is awake, alert and oriented x 4. She is able to tell me her full name, correct age, month, year, place, city and reason for being in the hospital. No facial droop, aphasia or dysarthria noted. She is able to follow commands, move all extremities equally, except left leg due to pain from surgery, strength seems symmetric. No neck stiffness or meningismus  - With continued improvement in mental status tend to feel that encephalopathy is more related to medications and hospitalization, with  post-op delirium superimposed on baseline cognitive issues and less likely seizures (no hx of seizures). Infectious work up has been negative. The patient's confusion, inattention and waxing / waning mental status is most suggestive of delirium, perhaps in the setting of polypharmacy (med list includes: dilaudid, tramadol and robaxin), hospitalization, or disrupted sleep cycle (sleeping during the day). - Alternative explanations for symptoms could include subclinical seizures or stroke which are  less likely given this presentation. No signs of meningitis / encephalitis  in vital signs, exam, or imaging.  - CT Head showed no acute abnormalities. - Acute metabolic encephalopathy/hospital delirium likely multifactorial due to use of opiates, recent surgery and hospitalization and underlying cognitive issues - Possible triggering factors: - Sertraline: Selective serotonin reuptake inhibitors (SSRIs) havebeen known to cause visual hallucinations  - Topamax: Her symptoms began after being started on this medication, per husband. Hallucinations and other symptoms of acute psychosis with this medication have been described in case reports - Dilaudid: After being given Dilaudid inpatient during this admission, her symptoms also worsened per husband. Hydromorphone may also cause hallucinations - the complexity of the hallucination may be related to the dose.   Recommendations: - Delirium precautions:    - Try to minimize deliriogenic medications as much as possible (J Am Geriatr Soc. 2012 Apr;60(4):616-31): benzodiazepines, anticholinergics, diphenhydramine, antihistamines, narcotics, Ambien/Lunesta/Sonata etc. - Environmental support for delirium: Lights on during the day, patient up and out of bed as much as is feasible, OT/PT, quiet dimly lit room at night, reorient patient often, provide hearing aides and glasses if patient uses them routinely, minimize sleep disruptions as much as possible overnight.  As  much as possible, reorient patient, and have them engage patient in activities, e.g. playing cards. TV should be off or on neutral background music unless patient engaged and watching. Try to keep interactions with the patient calm and quiet.    - Continue to look for and treat possible modifiable risk factors for delirium.  These include deliriogenic medications, infection (CBC/diff, UA, pan culture, CXR, and evaluation of the surgical site), metabolic derrangement (CMP, TSH/fT4), organ failure (uremia, hepatic profile, ABG), dehydration, malnutrition (check B1, B12; please replete thiamine), surgery/sedation, immobility/physical restraints, sensory impairments (vison/hearing), sleep deprivation, pain, and drug withdrawal or intoxication. - Please keep in mind that although delirium is a reversible cause of altered mental status, patients with delirium are prone to unpredictable behavior that make them a potential risk to their own safety as well as safety of others. One can  consider low dose seroquel (12.5 to 25 mg nightly PRN) as needed while monitoring QTc to reduce aggressive behavior, uptitrating slowly as needed. - MRI Brain when able - Consider EEG to r/o subclinical seizures if mental status does not continue to improve - Discontinue Dilaudid, Seroquel and Topamax   Gevena Mart DNP, ACNPC-AG  I have seen and examined the patient. I have formulated the assessment and recommendations. 71 y.o. female with past medical history of HTN, HLD, GERD, asthma, depression and anxiety, chronic migraine, mild cognitive dysfunction who was admitted for a total knee arthroplasty on 6/27. She has been confused since 6/29. Last received dilaudid on 6/29, tramadol on 6/30, robaxin 6/28. Exam with evidence for mild cognitive impairment. Recommendations as above.  Electronically signed: Dr. Caryl Pina

## 2021-10-06 ENCOUNTER — Inpatient Hospital Stay (HOSPITAL_COMMUNITY): Payer: Medicare HMO

## 2021-10-06 DIAGNOSIS — F419 Anxiety disorder, unspecified: Secondary | ICD-10-CM | POA: Diagnosis not present

## 2021-10-06 DIAGNOSIS — G9340 Encephalopathy, unspecified: Secondary | ICD-10-CM | POA: Diagnosis not present

## 2021-10-06 DIAGNOSIS — G43709 Chronic migraine without aura, not intractable, without status migrainosus: Secondary | ICD-10-CM | POA: Diagnosis not present

## 2021-10-06 DIAGNOSIS — I10 Essential (primary) hypertension: Secondary | ICD-10-CM | POA: Diagnosis not present

## 2021-10-06 LAB — CBC
HCT: 24.8 % — ABNORMAL LOW (ref 36.0–46.0)
Hemoglobin: 8.1 g/dL — ABNORMAL LOW (ref 12.0–15.0)
MCH: 30.2 pg (ref 26.0–34.0)
MCHC: 32.7 g/dL (ref 30.0–36.0)
MCV: 92.5 fL (ref 80.0–100.0)
Platelets: 222 10*3/uL (ref 150–400)
RBC: 2.68 MIL/uL — ABNORMAL LOW (ref 3.87–5.11)
RDW: 14.5 % (ref 11.5–15.5)
WBC: 6.5 10*3/uL (ref 4.0–10.5)
nRBC: 0 % (ref 0.0–0.2)

## 2021-10-06 LAB — BASIC METABOLIC PANEL
Anion gap: 9 (ref 5–15)
BUN: 10 mg/dL (ref 8–23)
CO2: 22 mmol/L (ref 22–32)
Calcium: 9.2 mg/dL (ref 8.9–10.3)
Chloride: 109 mmol/L (ref 98–111)
Creatinine, Ser: 0.67 mg/dL (ref 0.44–1.00)
GFR, Estimated: >60 mL/min (ref >60)
Glucose, Bld: 127 mg/dL — ABNORMAL HIGH (ref 70–99)
Potassium: 4.3 mmol/L (ref 3.5–5.1)
Sodium: 140 mmol/L (ref 135–145)

## 2021-10-06 LAB — MAGNESIUM: Magnesium: 2.1 mg/dL (ref 1.7–2.4)

## 2021-10-06 MED ORDER — GADOBUTROL 1 MMOL/ML IV SOLN
6.0000 mL | Freq: Once | INTRAVENOUS | Status: AC | PRN
  Administered 2021-10-06: 6 mL via INTRAVENOUS

## 2021-10-06 NOTE — Progress Notes (Signed)
PROGRESS NOTE    Caroline [name]  HAL:[id number] DOB: 1950/06/17 DOA: 10/01/2021 PCP: Aliene Beams, MD    Brief Narrative:  Caroline [name] is a 70 y.o. female with past medical history of hyperlipidemia, hypertension, GERD, asthma, depression and anxiety, chronic migraine, mild cognitive dysfunction presented to hospital for left knee replacement on 10/01/2021.  Postoperatively, patient had some hallucinations and confusion and disorientation so medical team was consulted for further evaluation.     Assessment and Plan: Principal Problem:   Acute encephalopathy/delirium  Active Problems:   S/P total knee arthroplasty, left   Pure hypercholesterolemia   Anxiety and depression   Chronic migraine without aura without status migrainosus, not intractable   Essential hypertension, benign   * Acute metabolic encephalopathy/delirium  Improving.  Could be postoperative delirium/use of opiates/polypharmacy with underlying baseline cognitive dysfunction..  Sedative-hypnotics and narcotics have been discontinued at this time including Dilaudid, Seroquel, tramadol Topamax.  Neurology also on board.  Continue Tylenol and Celebrex for pain management.  Patient likely has baseline cognitive dysfunction and history of prolonged anesthesia in family members.  Chest x-ray without acute findings.  Urinalysis was negative.  Ammonia slightly elevated but LFTs within normal limits.  TSH within normal limit.  Vitamin B12 within normal limits.  Continue delirium precautions.  Received IV fluid hydration during hospitalization.  Encourage incentive spirometry.  No obvious source of infection.  MRI of the brain without any acute findings  S/P total knee arthroplasty, left Postoperative status.  As per primary team.  Essential hypertension, benign Continue Toprol-XL  Chronic migraine without aura without status migrainosus, not intractable History of chronic migraine and follows up with neurology as outpatient.   Topamax has been discontinued at this time.  MRI done 09/03/2021 without any acute findings.  Repeat MRI 10/06/2021 without acute findings.  Anxiety and depression Continue zoloft daily   Pure hypercholesterolemia Continue crestor    DVT prophylaxis: SCDs Start: 10/01/21 1603 Place TED hose Start: 10/01/21 1603   Code Status:     Code Status: Full Code  Disposition: As per PT and primary team.  Likely to skilled nursing facility.    Family Communication: Communicated with the patient's son at bedside.  Procedures:  Left total knee arthroplasty by orthopedics   Antimicrobials:  None  Anti-infectives (From admission, onward)    Start     Dose/Rate Route Frequency Ordered Stop   10/01/21 1930  ceFAZolin (ANCEF) IVPB 1 g/50 mL premix        1 g 100 mL/hr over 30 Minutes Intravenous Every 8 hours 10/01/21 1602 10/02/21 0411   10/01/21 1246  vancomycin (VANCOCIN) powder  Status:  Discontinued          As needed 10/01/21 1246 10/01/21 1406   10/01/21 0915  ceFAZolin (ANCEF) IVPB 2g/100 mL premix        2 g 200 mL/hr over 30 Minutes Intravenous On call to O.R. 10/01/21 0907 10/01/21 1136      Subjective: Today, patient was seen and examined at bedside.  Patient sounds at bedside.  Patient appears to be more alert awake Communicative.  Seems to be better compared to yesterday as per the patient's son.  Denies overt pain  Objective: Vitals:   10/05/21 1502 10/05/21 2122 10/06/21 0859 10/06/21 1337  BP: 120/68 132/77 140/72 134/86  Pulse: 81 87 84 96  Resp: 18  17 17   Temp: 98.5 F (36.9 C) 98.9 F (37.2 C) 98.7 F (37.1 C) 98.2 F (36.8 C)  TempSrc: Oral  Oral Oral  SpO2: 97% 97% 97% 97%  Weight:      Height:       No intake or output data in the 24 hours ending 10/06/21 1505  Filed Weights   10/01/21 0921  Weight: 63.9 kg    Physical Examination: Body mass index is 25.77 kg/m.   General:  Average built, not in obvious distress, alert awake and  communicative HENT:   No scleral pallor or icterus noted. Oral mucosa is moist.  Chest:  Clear breath sounds.  Diminished breath sounds bilaterally. No crackles or wheezes.  CVS: S1 &S2 heard. No murmur.  Regular rate and rhythm. Abdomen: Soft, nontender, nondistended.  Bowel sounds are heard.   Extremities: No cyanosis, clubbing or edema.  Peripheral pulses are palpable. Psych: Alert, awake and communicative, oriented to place and has hard time remembering things, confused to person at times  CNS:  No cranial nerve deficits.  Moves all extremities. Skin: Warm and dry.  No rashes noted.   Data Reviewed:   CBC: Recent Labs  Lab 10/04/21 1120 10/06/21 0110  WBC 7.7 6.5  NEUTROABS 5.9  --   HGB 8.3* 8.1*  HCT 25.0* 24.8*  MCV 90.9 92.5  PLT 188 222     Basic Metabolic Panel: Recent Labs  Lab 10/04/21 1120 10/04/21 1202 10/05/21 0147 10/06/21 0110  NA 137  --  137 140  K 3.2*  --  3.5 4.3  CL 106  --  108 109  CO2 22  --  22 22  GLUCOSE 124*  --  112* 127*  BUN 11  --  12 10  CREATININE 0.59  --  0.59 0.67  CALCIUM 8.8*  --  8.8* 9.2  MG  --  2.0  --  2.1     Liver Function Tests: Recent Labs  Lab 10/04/21 1120  AST 18  ALT 14  ALKPHOS 58  BILITOT 0.7  PROT 6.2*  ALBUMIN 3.3*      Radiology Studies: MR BRAIN W WO CONTRAST  Result Date: 10/06/2021 CLINICAL DATA:  71 year old female with altered mental status. Status post knee surgery on 10/01/2021. EXAM: MRI HEAD WITHOUT AND WITH CONTRAST TECHNIQUE: Multiplanar, multiecho pulse sequences of the brain and surrounding structures were obtained without and with intravenous contrast. CONTRAST:  45mL GADAVIST GADOBUTROL 1 MMOL/ML IV SOLN COMPARISON:  Head CT 10/04/2021. Brain MRI 08/31/2021. FINDINGS: Brain: No restricted diffusion to suggest acute infarction. No midline shift, mass effect, evidence of mass lesion, ventriculomegaly, extra-axial collection or acute intracranial hemorrhage. Cervicomedullary junction  and pituitary are within normal limits. Patchy and confluent, widely scattered bilateral cerebral white matter T2 and FLAIR hyperintensity is stable since May. The pattern is nonspecific. No superimposed cortical encephalomalacia or chronic cerebral blood products identified. No abnormal enhancement identified. No dural thickening. Deep gray matter nuclei, brainstem and cerebellum appear normal for age. Vascular: Major intracranial vascular flow voids are stable. Dominant appearing distal right vertebral artery. The major dural venous sinuses are enhancing and appear to be patent. Skull and upper cervical spine: Negative visible cervical spine for age. Visualized bone marrow signal is within normal limits. Sinuses/Orbits: Stable postoperative changes to both globes, mild posterior left ethmoid sinus mucosal thickening. Other: Mastoids remain clear. Visible internal auditory structures appear normal. Negative visible scalp and face. IMPRESSION: 1. No acute intracranial abnormality. 2. Stable MRI appearance of the brain since May. 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 Electronically Signed   By: Odessa Fleming M.D.   On:  10/06/2021 09:25   DG Abd 1 View  Result Date: 10/05/2021 CLINICAL DATA:  [id number] EXAM: ABDOMEN - 1 VIEW COMPARISON:  Radiograph dated December 28, 2016 FINDINGS: Nonobstructive bowel gas pattern. There is a 10 mm rectangular radiopaque density projecting over the LEFT pubic ramus. This was not present in 2018 and is in indeterminate location. Pelvic phleboliths. Degenerative changes the lumbar spine IMPRESSION: There is a 10 mm rectangular radiopaque and possibly metallic density projecting over the LEFT pubic ramus. This may be external to patient given this was not present in 2018. Recommend correlation with clinical history and physical exam. Electronically Signed   By: Valentino Saxon M.D.   On: 10/05/2021 17:38      LOS: 2 days    Flora Lipps, MD [facility] Available via Epic secure chat 7am-7pm After these hours, please refer to coverage provider listed on [email or web address] 10/06/2021, 3:05 PM

## 2021-10-06 NOTE — Progress Notes (Signed)
Physical Therapy Treatment Patient Details Name: Caroline [name] MRN: [id number] DOB: [birth year] Today's Date: 10/06/2021   History of Present Illness Pt is a 71yo F s/p L TKA on 6/27. MRI on 7/2 showed no acute abnormalities but advanced white matter disease. PMH: HTN, osteopenia, asthma    PT Comments    Pt demonstrated increased tolerance to activity this session and able to ambulate 236ft with RW min guards. Pt still remains confused and requires cues to stay on task and cues for reorientation to environment. Pt demonstrating functional improvements but remains limited by cognition. Would require 24/7 supervision for safety to discharge home.   Recommendations for follow up therapy are one component of a multi-disciplinary discharge planning process, led by the attending physician.  Recommendations may be updated based on patient status, additional functional criteria and insurance authorization.  Follow Up Recommendations  Follow physician's recommendations for discharge plan and follow up therapies     Assistance Recommended at Discharge Frequent or constant Supervision/Assistance  Patient can return home with the following A little help with walking and/or transfers;A little help with bathing/dressing/bathroom;Assistance with cooking/housework;Assist for transportation;Help with stairs or ramp for entrance;Direct supervision/assist for financial management;Direct supervision/assist for medications management   Equipment Recommendations  None recommended by PT    Recommendations for Other Services       Precautions / Restrictions Precautions Precautions: Fall;Knee Precaution Booklet Issued: Yes (comment) Precaution Comments: no pillow under knee Required Braces or Orthoses: Knee Immobilizer - Left Knee Immobilizer - Left: Discontinue once straight leg raise with < 10 degree lag Restrictions Weight Bearing Restrictions: Yes LLE Weight Bearing: Weight bearing as tolerated      Mobility  Bed Mobility Overal bed mobility: Needs Assistance Bed Mobility: Supine to Sit, Sit to Supine     Supine to sit: Min guard Sit to supine: Min guard   General bed mobility comments: min guard for safety and increased time and cueing needed to stay on task    Transfers Overall transfer level: Needs assistance Equipment used: Rolling walker (2 wheels) Transfers: Sit to/from Stand Sit to Stand: Min guard           General transfer comment: min guard for safety, decreased safety awareness with hand placement    Ambulation/Gait Ambulation/Gait assistance: Min guard Gait Distance (Feet): 250 Feet Assistive device: Rolling walker (2 wheels) Gait Pattern/deviations: Step-through pattern, Decreased stride length, Antalgic, Trunk flexed Gait velocity: decreased Gait velocity interpretation: <1.8 ft/sec, indicate of risk for recurrent falls   General Gait Details: min guard for safety and cues required to stay on task. improved way finding ability but still requiring cues to find room and easily distractable   Stairs             Wheelchair Mobility    Modified Rankin (Stroke Patients Only)       Balance Overall balance assessment: Needs assistance Sitting-balance support: No upper extremity supported, Feet supported Sitting balance-Leahy Scale: Good     Standing balance support: Bilateral upper extremity supported, During functional activity Standing balance-Leahy Scale: Poor Standing balance comment: reliant on RW                            Cognition Arousal/Alertness: Awake/alert Behavior During Therapy: Flat affect Overall Cognitive Status: Impaired/Different from baseline Area of Impairment: Attention, Memory, Following commands, Safety/judgement, Awareness, Problem solving, Orientation                 Orientation Level:  Disoriented to, Place, Situation, Time Current Attention Level: Sustained Memory: Decreased short-term  memory, Decreased recall of precautions Following Commands: Follows one step commands inconsistently Safety/Judgement: Decreased awareness of safety, Decreased awareness of deficits Awareness: Emergent Problem Solving: Slow processing, Decreased initiation, Difficulty sequencing, Requires tactile cues General Comments: pt unable to state day of the week and convinced therapist is lying about the day. Difficulty with finding room and appearing to think she is at church. Asking therapist for coffe with olive oil        Exercises Total Joint Exercises Straight Leg Raises:  (unable to perform actively)    General Comments        Pertinent Vitals/Pain Pain Assessment Pain Assessment: Faces Faces Pain Scale: Hurts a little bit Pain Location: L knee Pain Descriptors / Indicators: Grimacing, Operative site guarding Pain Intervention(s): Limited activity within patient's tolerance, Monitored during session    Home Living                          Prior Function            PT Goals (current goals can now be found in the care plan section) Acute Rehab PT Goals Patient Stated Goal: to return home PT Goal Formulation: With patient Time For Goal Achievement: 10/15/21 Potential to Achieve Goals: Good Progress towards PT goals: Progressing toward goals    Frequency    7X/week      PT Plan Current plan remains appropriate    Co-evaluation              AM-PAC PT "6 Clicks" Mobility   Outcome Measure  Help needed turning from your back to your side while in a flat bed without using bedrails?: None Help needed moving from lying on your back to sitting on the side of a flat bed without using bedrails?: None Help needed moving to and from a bed to a chair (including a wheelchair)?: A Little Help needed standing up from a chair using your arms (e.g., wheelchair or bedside chair)?: A Little Help needed to walk in hospital room?: A Little Help needed climbing 3-5  steps with a railing? : A Lot 6 Click Score: 19    End of Session         PT Visit Diagnosis: Other abnormalities of gait and mobility (R26.89);Difficulty in walking, not elsewhere classified (R26.2);Pain Pain - Right/Left: Left Pain - part of body: Knee     Time: 6314-9702 PT Time Calculation (min) (ACUTE ONLY): 30 min  Charges:  $Gait Training: 8-22 mins $Therapeutic Activity: 8-22 mins                     Davina Poke, SPT Acute Rehabilitation Services  Office: [phone]    Davina Poke 10/06/2021, 1:34 PM

## 2021-10-06 NOTE — Plan of Care (Signed)
  Problem: Clinical Measurements: Goal: Respiratory complications will improve Outcome: Progressing   Problem: Activity: Goal: Risk for activity intolerance will decrease Outcome: Progressing   Problem: Nutrition: Goal: Adequate nutrition will be maintained Outcome: Progressing   Problem: Coping: Goal: Level of anxiety will decrease Outcome: Progressing   Problem: Elimination: Goal: Will not experience complications related to bowel motility Outcome: Progressing Goal: Will not experience complications related to urinary retention Outcome: Progressing   Problem: Safety: Goal: Ability to remain free from injury will improve Outcome: Progressing

## 2021-10-06 NOTE — Progress Notes (Signed)
Physical Therapy Treatment Patient Details Name: Caroline [name] MRN: [id number] DOB: Jan 09, 1951 Today's Date: 10/06/2021   History of Present Illness Pt is a 71yo F s/p L TKA on 6/27. MRI on 7/2 showed no acute abnormalities but advanced white matter disease. PMH: HTN, osteopenia, asthma    PT Comments    Pt progressing steadily towards her mobility goals, exhibiting increased activity tolerance and ambulation distance. Pt received standing in room with pt son upon arrival; pt states she is looking for her sister (pt sister lives in [place]). Pt ambulating 350 ft with a walker at a min guard assist level and negotiated 1 step with a railing. Gait training provided to promote normal walking pattern. Due to continued impaired cognition, pt will require 24/7 supervision upon discharge.     Recommendations for follow up therapy are one component of a multi-disciplinary discharge planning process, led by the attending physician.  Recommendations may be updated based on patient status, additional functional criteria and insurance authorization.  Follow Up Recommendations  Follow physician's recommendations for discharge plan and follow up therapies     Assistance Recommended at Discharge Frequent or constant Supervision/Assistance  Patient can return home with the following A little help with walking and/or transfers;A little help with bathing/dressing/bathroom;Assistance with cooking/housework;Assist for transportation;Help with stairs or ramp for entrance;Direct supervision/assist for financial management;Direct supervision/assist for medications management   Equipment Recommendations  None recommended by PT    Recommendations for Other Services       Precautions / Restrictions Precautions Precautions: Fall;Knee Precaution Booklet Issued: Yes (comment) Precaution Comments: no pillow under knee Required Braces or Orthoses: Knee Immobilizer - Left Knee Immobilizer - Left: Discontinue once  straight leg raise with < 10 degree lag Restrictions Weight Bearing Restrictions: Yes LLE Weight Bearing: Weight bearing as tolerated     Mobility  Bed Mobility Overal bed mobility: Needs Assistance Bed Mobility: Supine to Sit, Sit to Supine     Supine to sit: Supervision Sit to supine: Supervision   General bed mobility comments: supervision for safety, no physical assist required    Transfers Overall transfer level: Needs assistance Equipment used: Rolling walker (2 wheels) Transfers: Sit to/from Stand Sit to Stand: Min guard           General transfer comment: min guard for safety    Ambulation/Gait Ambulation/Gait assistance: Min guard Gait Distance (Feet): 350 Feet Assistive device: Rolling walker (2 wheels) Gait Pattern/deviations: Step-through pattern, Decreased stride length, Antalgic, Trunk flexed, Narrow base of support, Decreased step length - left Gait velocity: decreased     General Gait Details: cues for decreased R step length, wider BOS, obstacle negotiation, increased gait speed and upright posture/gaze. min guard for safety.   Stairs Stairs: Yes Stairs assistance: Min guard Stair Management: One rail Right Number of Stairs: 1 General stair comments: cues for sequencing/technique   Wheelchair Mobility    Modified Rankin (Stroke Patients Only)       Balance Overall balance assessment: Needs assistance Sitting-balance support: No upper extremity supported, Feet supported Sitting balance-Leahy Scale: Good     Standing balance support: Bilateral upper extremity supported, During functional activity Standing balance-Leahy Scale: Poor Standing balance comment: reliant on RW                            Cognition Arousal/Alertness: Awake/alert Behavior During Therapy: Flat affect Overall Cognitive Status: Impaired/Different from baseline Area of Impairment: Attention, Memory, Following commands, Safety/judgement, Awareness,  Problem solving,  Orientation                 Orientation Level: Disoriented to, Place, Situation, Time Current Attention Level: Sustained Memory: Decreased short-term memory, Decreased recall of precautions Following Commands: Follows one step commands inconsistently Safety/Judgement: Decreased awareness of safety, Decreased awareness of deficits Awareness: Emergent Problem Solving: Slow processing, Decreased initiation, Difficulty sequencing, Requires tactile cues General Comments: pt unable to state day of the week and convinced therapist is lying about the day. Difficulty with finding room and appearing to think she is at church. Asking therapist for coffe with olive oil        Exercises      General Comments        Pertinent Vitals/Pain Pain Assessment Pain Assessment: Faces Faces Pain Scale: Hurts a little bit Pain Location: L knee Pain Descriptors / Indicators: Grimacing, Operative site guarding Pain Intervention(s): Monitored during session    Home Living                          Prior Function            PT Goals (current goals can now be found in the care plan section) Acute Rehab PT Goals Patient Stated Goal: to return home PT Goal Formulation: With patient Time For Goal Achievement: 10/15/21 Potential to Achieve Goals: Good Progress towards PT goals: Progressing toward goals    Frequency    7X/week      PT Plan Current plan remains appropriate    Co-evaluation              AM-PAC PT "6 Clicks" Mobility   Outcome Measure  Help needed turning from your back to your side while in a flat bed without using bedrails?: None Help needed moving from lying on your back to sitting on the side of a flat bed without using bedrails?: None Help needed moving to and from a bed to a chair (including a wheelchair)?: A Little Help needed standing up from a chair using your arms (e.g., wheelchair or bedside chair)?: A Little Help needed to  walk in hospital room?: A Little Help needed climbing 3-5 steps with a railing? : A Little 6 Click Score: 20    End of Session Equipment Utilized During Treatment: Gait belt;Left knee immobilizer Activity Tolerance: Patient tolerated treatment well Patient left: in bed;with call bell/phone within reach;with bed alarm set;with family/visitor present   PT Visit Diagnosis: Other abnormalities of gait and mobility (R26.89);Difficulty in walking, not elsewhere classified (R26.2);Pain Pain - Right/Left: Left Pain - part of body: Knee     Time: 1510-1540 PT Time Calculation (min) (ACUTE ONLY): 30 min  Charges:  $Gait Training: 23-37 mins $Therapeutic Activity: 8-22 mins                     Lillia Pauls, PT, DPT Acute Rehabilitation Services Office [phone]    Norval Morton 10/06/2021, 3:55 PM

## 2021-10-06 NOTE — Progress Notes (Signed)
Ambulated well this morning with physical therapy 150 feet Did have a difficult night in terms of not sleeping much and being up and down most of the night MRI scan this morning shows no significant change from MRI scan in May with some white matter disease which is chronic Medical work-up otherwise negative for infectious or metabolic cause of the delirium Patient currently is on no pain medicine  Plan at this time is social work consult for possible skilled nursing home placement We will see how she does tonight. Need physical therapy assessment as to physical suitability for skilled nursing home placement. Reassess tomorrow  Appreciate excellent medical and neurological consultation assistance

## 2021-10-07 DIAGNOSIS — F419 Anxiety disorder, unspecified: Secondary | ICD-10-CM | POA: Diagnosis not present

## 2021-10-07 DIAGNOSIS — G43709 Chronic migraine without aura, not intractable, without status migrainosus: Secondary | ICD-10-CM | POA: Diagnosis not present

## 2021-10-07 DIAGNOSIS — G9340 Encephalopathy, unspecified: Secondary | ICD-10-CM | POA: Diagnosis not present

## 2021-10-07 DIAGNOSIS — I10 Essential (primary) hypertension: Secondary | ICD-10-CM | POA: Diagnosis not present

## 2021-10-07 NOTE — Progress Notes (Signed)
  Subjective: Patient stable.  Was somewhat confused last night but was able to sleep after 2 AM.  Physical therapy recommends 24/7 observation due to patient's cognitive issues   Objective: Vital signs in last 24 hours: Temp:  [98 F (36.7 C)-98.4 F (36.9 C)] 98.4 F (36.9 C) (07/03 0918) Pulse Rate:  [75-96] 92 (07/03 0918) Resp:  [16-18] 18 (07/03 0918) BP: (117-155)/(73-88) 132/88 (07/03 0918) SpO2:  [97 %-99 %] 99 % (07/03 0514)  Intake/Output from previous day: No intake/output data recorded. Intake/Output this shift: No intake/output data recorded.  Exam:  Dorsiflexion/Plantar flexion intact  Labs: Recent Labs    10/04/21 1120 10/06/21 0110  HGB 8.3* 8.1*   Recent Labs    10/04/21 1120 10/06/21 0110  WBC 7.7 6.5  RBC 2.75* 2.68*  HCT 25.0* 24.8*  PLT 188 222   Recent Labs    10/05/21 0147 10/06/21 0110  NA 137 140  K 3.5 4.3  CL 108 109  CO2 22 22  BUN 12 10  CREATININE 0.59 0.67  GLUCOSE 112* 127*  CALCIUM 8.8* 9.2   No results for input(s): "LABPT", "INR" in the last 72 hours.  Assessment/Plan: Plan at this time is to proceed with skilled nursing home placement and referral.  Consult to social worker placed yesterday.  Husband not sure if he can manage this situation at home by himself.  Continue with physical therapy twice a day to facilitate range of motion and strengthening.  Also continue CPM machine 1 hour 3 times a day to increase knee range of motion.   Caroline [name] 10/07/2021, 9:23 AM

## 2021-10-07 NOTE — TOC Initial Note (Signed)
Transition of Care [facility]) - Initial/Assessment Note    Patient Details  Name: Caroline [name] MRN: [id number] Date of Birth: [birth year]  [facility] CM/SW Contact:    Joanne Chars, LCSW Phone Number: 10/07/2021, 1:51 PM  Clinical Narrative:    CSW met with pt and husband Legrand Como regarding DC recommendation for SNF.  Permission given to speak with husband present.  Pt and husband do not want to move forward with SNF.  They report they worked with PT earlier today, pt did quite well, they report pt confusion has been better for the past two days, they report one son in [place] who is available to assist and another who is coming from out of town and will be here for the next week to also assist.  No current services at home, they would like to have [facility] services at [place].  Current DME in home: walker, 3n1.    CSW confirmed with PT and recommendation has been changed to [facility], pt did well with session today. RNCM informed.                 Expected Discharge Plan: [place] Barriers to Discharge: No Barriers Identified   Patient Goals and CMS Choice Patient states their goals for this hospitalization and ongoing recovery are:: mobility   Choice offered to / list presented to : Patient  Expected Discharge Plan and Services Expected Discharge Plan: [place] In-house Referral: Clinical Social Work   Post Acute Care Choice: [place] arrangements for the past 2 months: [place] Expected Discharge Date: 10/02/21               DME Arranged: [facility] rolling   Date DME Agency Contacted: 10/02/21 Time DME Agency Contacted: [id number] Representative spoke with at DME Agency: [place]: PT [place]: [place]        Prior Living Arrangements/Services Living arrangements for the past 2 months: [place] with:: Spouse Patient language and need for interpreter reviewed:: Yes Do you feel safe  going back to the place where you live?: Yes      Need for Family Participation in Patient Care: Yes (Comment) Care giver support system in place?: Yes (comment) Current home services: Other (comment) (none) Criminal Activity/Legal Involvement Pertinent to Current Situation/Hospitalization: No - Comment as needed  Activities of Daily Living Home Assistive Devices/Equipment: Eyeglasses, Blood pressure cuff, Cane (specify quad or straight) ADL Screening (condition at time of admission) Patient's cognitive ability adequate to safely complete daily activities?: Yes Is the patient deaf or have difficulty hearing?: No Does the patient have difficulty seeing, even when wearing glasses/contacts?: No Does the patient have difficulty concentrating, remembering, or making decisions?: No Patient able to express need for assistance with ADLs?: Yes Does the patient have difficulty dressing or bathing?: No Independently performs ADLs?: Yes (appropriate for developmental age) Does the patient have difficulty walking or climbing stairs?: Yes Weakness of Legs: None Weakness of Arms/Hands: None  Permission Sought/Granted Permission sought to share information with : Family Supports Permission granted to share information with : Yes, Verbal Permission Granted  Share Information with NAME: husband Legrand Como           Emotional Assessment Appearance:: Appears stated age Attitude/Demeanor/Rapport: Engaged Affect (typically observed): Appropriate, Pleasant Orientation: : Oriented to Self, Oriented to Place, Oriented to Situation Alcohol / Substance Use: Not Applicable Psych Involvement: Outpatient Provider  Admission diagnosis:  S/P total  knee arthroplasty, left [Z96.652] Patient Active Problem List   Diagnosis Date Noted   Acute encephalopathy/delirium  10/04/2021   S/P total knee arthroplasty, left 10/01/2021   Postcalcaneal bursitis of right foot 03/11/2021   Primary osteoarthritis of both knees  03/11/2021   Chronic pain of both knees 03/11/2021   Asthma due to seasonal allergies 11/17/2019   Osteopenia of multiple sites 10/19/2018   Chronic sinusitis 06/10/2018   Essential hypertension, benign 10/22/2017   Benign hematuria 06/17/2017   Pure hypercholesterolemia 08/11/2016   Anxiety and depression 08/11/2016   Chronic migraine without aura without status migrainosus, not intractable 08/11/2016   PCP:  Caren Macadam, MD Pharmacy:   [facility], [address] [place] [address] Phone: [phone] Fax: [phone]  [facility] [address], [place] [address] Phone: [phone] Fax: [phone]     Social Determinants of Health (SDOH) Interventions    Readmission Risk Interventions     No data to display

## 2021-10-07 NOTE — Plan of Care (Signed)

## 2021-10-07 NOTE — Progress Notes (Signed)
PROGRESS NOTE    Caroline [name]  LPF:[id number] DOB: 08-27-1950 DOA: 10/01/2021 PCP: Aliene Beams, MD    Brief Narrative:  Caroline [name] is a 71 y.o. female with past medical history of hyperlipidemia, hypertension, GERD, asthma, depression and anxiety, chronic migraine, mild cognitive dysfunction presented to hospital for left knee replacement on 10/01/2021.  Postoperatively, patient had some hallucinations and confusion and disorientation so medical team was consulted for further evaluation.     Assessment and Plan: Principal Problem:   Acute encephalopathy/delirium  Active Problems:   S/P total knee arthroplasty, left   Pure hypercholesterolemia   Anxiety and depression   Chronic migraine without aura without status migrainosus, not intractable   Essential hypertension, benign   * Acute metabolic encephalopathy/delirium  Improving. Likely postoperative delirium/use of opiates/polypharmacy with underlying baseline cognitive dysfunction..  Sedative-hypnotics and narcotics have been discontinued at this time including Dilaudid, Seroquel, tramadol Topamax.  Neurology also on board.  Continue Tylenol and Celebrex for pain management.    Chest x-ray without acute findings.  Urinalysis was negative.  Ammonia slightly elevated but LFTs within normal limits.  TSH within normal limit.  Vitamin B12 within normal limits.  Continue delirium precautions.  Received IV fluid hydration during hospitalization.  Encourage incentive spirometry. MRI of the brain without any acute findings  S/P total knee arthroplasty, left Postoperative status.  As per primary team.  Essential hypertension, benign Continue Toprol-XL  Chronic migraine without aura without status migrainosus, not intractable History of chronic migraine and follows up with neurology as outpatient.  Topamax has been discontinued at this time.  MRI done 09/03/2021 without any acute findings.  Repeat MRI 10/06/2021 without acute findings.  Anxiety  and depression Continue zoloft daily   Pure hypercholesterolemia Continue crestor    DVT prophylaxis: SCDs Start: 10/01/21 1603 Place TED hose Start: 10/01/21 1603   Code Status:     Code Status: Full Code  Disposition: As per PT and primary team.  Likely to skilled nursing facility.  Medically  stable at this time.    Family Communication: Communicated with the patient's son has been at bedside.  Procedures:  Left total knee arthroplasty by orthopedics   Antimicrobials:  None  Anti-infectives (From admission, onward)    Start     Dose/Rate Route Frequency Ordered Stop   10/01/21 1930  ceFAZolin (ANCEF) IVPB 1 g/50 mL premix        1 g 100 mL/hr over 30 Minutes Intravenous Every 8 hours 10/01/21 1602 10/02/21 0411   10/01/21 1246  vancomycin (VANCOCIN) powder  Status:  Discontinued          As needed 10/01/21 1246 10/01/21 1406   10/01/21 0915  ceFAZolin (ANCEF) IVPB 2g/100 mL premix        2 g 200 mL/hr over 30 Minutes Intravenous On call to O.R. 10/01/21 0907 10/01/21 1136      Subjective: Today, patient was seen and examined at bedside.  More alert awake and communicative today.  Patient's son and husband at bedside.  Denies any pain, nausea, vomiting, fever or chills.  Objective: Vitals:   10/06/21 2107 10/06/21 2111 10/07/21 0514 10/07/21 0918  BP: 117/73  (!) 155/83 132/88  Pulse:  81 75 92  Resp: 16  18 18   Temp: 98.4 F (36.9 C)  98 F (36.7 C) 98.4 F (36.9 C)  TempSrc: Oral  Oral Oral  SpO2:  98% 99%   Weight:      Height:  Intake/Output Summary (Last 24 hours) at 10/07/2021 1054 Last data filed at 10/07/2021 1017 Gross per 24 hour  Intake 240 ml  Output --  Net 240 ml    Filed Weights   10/01/21 0921  Weight: 63.9 kg    Physical Examination: Body mass index is 25.77 kg/m.   General:  Average built, not in obvious distress, alert awake and communicative HENT:   No scleral pallor or icterus noted. Oral mucosa is moist.  Chest:   Clear breath sounds.  Diminished breath sounds bilaterally. No crackles or wheezes.  CVS: S1 &S2 heard. No murmur.  Regular rate and rhythm. Abdomen: Soft, nontender, nondistended.  Bowel sounds are heard.   Extremities: No cyanosis, clubbing or edema.  Peripheral pulses are palpable.  Status post total knee arthroplasty. Psych: Alert, awake and oriented to place and person.  Impaired memory. CNS:  No cranial nerve deficits.  Moves all extremities. Skin: Warm and dry.  No rashes noted.   Data Reviewed:   CBC: Recent Labs  Lab 10/04/21 1120 10/06/21 0110  WBC 7.7 6.5  NEUTROABS 5.9  --   HGB 8.3* 8.1*  HCT 25.0* 24.8*  MCV 90.9 92.5  PLT 188 222     Basic Metabolic Panel: Recent Labs  Lab 10/04/21 1120 10/04/21 1202 10/05/21 0147 10/06/21 0110  NA 137  --  137 140  K 3.2*  --  3.5 4.3  CL 106  --  108 109  CO2 22  --  22 22  GLUCOSE 124*  --  112* 127*  BUN 11  --  12 10  CREATININE 0.59  --  0.59 0.67  CALCIUM 8.8*  --  8.8* 9.2  MG  --  2.0  --  2.1     Liver Function Tests: Recent Labs  Lab 10/04/21 1120  AST 18  ALT 14  ALKPHOS 58  BILITOT 0.7  PROT 6.2*  ALBUMIN 3.3*      Radiology Studies: MR BRAIN W WO CONTRAST  Result Date: 10/06/2021 CLINICAL DATA:  71 year old female with altered mental status. Status post knee surgery on 10/01/2021. EXAM: MRI HEAD WITHOUT AND WITH CONTRAST TECHNIQUE: Multiplanar, multiecho pulse sequences of the brain and surrounding structures were obtained without and with intravenous contrast. CONTRAST:  74mL GADAVIST GADOBUTROL 1 MMOL/ML IV SOLN COMPARISON:  Head CT 10/04/2021. Brain MRI 08/31/2021. FINDINGS: Brain: No restricted diffusion to suggest acute infarction. No midline shift, mass effect, evidence of mass lesion, ventriculomegaly, extra-axial collection or acute intracranial hemorrhage. Cervicomedullary junction and pituitary are within normal limits. Patchy and confluent, widely scattered bilateral cerebral white  matter T2 and FLAIR hyperintensity is stable since May. The pattern is nonspecific. No superimposed cortical encephalomalacia or chronic cerebral blood products identified. No abnormal enhancement identified. No dural thickening. Deep gray matter nuclei, brainstem and cerebellum appear normal for age. Vascular: Major intracranial vascular flow voids are stable. Dominant appearing distal right vertebral artery. The major dural venous sinuses are enhancing and appear to be patent. Skull and upper cervical spine: Negative visible cervical spine for age. Visualized bone marrow signal is within normal limits. Sinuses/Orbits: Stable postoperative changes to both globes, mild posterior left ethmoid sinus mucosal thickening. Other: Mastoids remain clear. Visible internal auditory structures appear normal. Negative visible scalp and face. IMPRESSION: 1. No acute intracranial abnormality. 2. Stable MRI appearance of the brain since May. 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 Electronically Signed   By: Odessa Fleming M.D.   On: 10/06/2021 09:25   DG  Abd 1 View  Result Date: 10/05/2021 CLINICAL DATA:  [id number] EXAM: ABDOMEN - 1 VIEW COMPARISON:  Radiograph dated December 28, 2016 FINDINGS: Nonobstructive bowel gas pattern. There is a 10 mm rectangular radiopaque density projecting over the LEFT pubic ramus. This was not present in 2018 and is in indeterminate location. Pelvic phleboliths. Degenerative changes the lumbar spine IMPRESSION: There is a 10 mm rectangular radiopaque and possibly metallic density projecting over the LEFT pubic ramus. This may be external to patient given this was not present in 2018. Recommend correlation with clinical history and physical exam. Electronically Signed   By: Meda Klinefelter M.D.   On: 10/05/2021 17:38      LOS: 3 days    Joycelyn Das, MD [facility] Available via Epic secure chat 7am-7pm After these hours, please refer to coverage provider listed on  [email or web address] 10/07/2021, 10:54 AM

## 2021-10-07 NOTE — TOC Transition Note (Signed)
Transition of Care [facility]) - CM/SW Discharge Note   Patient Details  Name: Caroline [name] MRN: [id number] Date of Birth: [birth year]  Transition of Care [facility]) CM/SW Contact:  Bess Kinds, RN Phone Number: [phone] 10/07/2021, 4:23 PM   Clinical Narrative:     Spoke with patient and spouse at the bedside to discuss post acute transition. Confirmed patient wanting to go home. Already has a RW and 3N1 at home and requests to return RW that was brought to room. Offered choice of HH agency. Patient already followed by [facility]. Spouse to provide transportation home. No further TOC needs identified at this time.   Final next level of care: Home w Home Health Services Barriers to Discharge: No Barriers Identified   Patient Goals and CMS Choice Patient states their goals for this hospitalization and ongoing recovery are:: home with husband CMS Medicare.gov Compare Post Acute Care list provided to:: Patient Choice offered to / list presented to : Patient  Discharge Placement                       Discharge Plan and Services In-house Referral: Clinical Social Work   Post Acute Care Choice: Home Health          DME Arranged: Dan Humphreys rolling   Date DME Agency Contacted: 10/02/21 Time DME Agency Contacted: 1058 Representative spoke with at DME Agency: Jeraldine Loots HH Arranged: PT HH Agency: [facility] Date [facility] Agency Contacted: 10/07/21 Time HH Agency Contacted: 1422 Representative spoke with at [facility] Agency: Clifton Custard  Social Determinants of Health (SDOH) Interventions     Readmission Risk Interventions     No data to display

## 2021-10-07 NOTE — Progress Notes (Signed)
Provided discharge education/instructions. Pt not in acute distress, discharged home with belongings accompanied by family.

## 2021-10-07 NOTE — Progress Notes (Addendum)
Physical Therapy Treatment Patient Details Name: Caroline [name] MRN: [id number] DOB: 04/10/1950 Today's Date: 10/07/2021   History of Present Illness Pt is a 71yo F s/p L TKA on 6/27. MRI on 7/2 showed no acute abnormalities but advanced white matter disease. PMH: HTN, osteopenia, asthma    PT Comments    Pt instructed in and performed gait training, step training, and therapeutic exercises. Pt's cognition remains impaired compared to her baseline but much improved. Pt will have assistance from spouse and two sons upon discharge. Pt and pt's spouse would like to discharge home today with home health (vs SNF). Home safety reviewed, all questions were answered, and gait belt provided.   Recommendations for follow up therapy are one component of a multi-disciplinary discharge planning process, led by the attending physician.  Recommendations may be updated based on patient status, additional functional criteria and insurance authorization.  Follow Up Recommendations  Home health PT     Assistance Recommended at Discharge Frequent or constant Supervision/Assistance  Patient can return home with the following A little help with walking and/or transfers;A little help with bathing/dressing/bathroom;Assistance with cooking/housework;Assist for transportation;Help with stairs or ramp for entrance;Direct supervision/assist for financial management;Direct supervision/assist for medications management   Equipment Recommendations  None recommended by PT    Recommendations for Other Services       Precautions / Restrictions Precautions Precautions: Fall;Knee Precaution Booklet Issued: Yes (comment) Precaution Comments: no pillow under knee Required Braces or Orthoses: Knee Immobilizer - Left Knee Immobilizer - Left: Discontinue once straight leg raise with < 10 degree lag Restrictions Weight Bearing Restrictions: Yes LLE Weight Bearing: Weight bearing as tolerated     Mobility  Bed  Mobility Overal bed mobility: Needs Assistance Bed Mobility: Supine to Sit, Sit to Supine     Supine to sit: Supervision Sit to supine: Supervision   General bed mobility comments: supervision for safety, no physical assist required    Transfers Overall transfer level: Needs assistance Equipment used: Rolling walker (2 wheels) Transfers: Sit to/from Stand Sit to Stand: Min guard           General transfer comment: min guard for safety    Ambulation/Gait Ambulation/Gait assistance: Min guard Gait Distance (Feet): 300 Feet Assistive device: Rolling walker (2 wheels) Gait Pattern/deviations: Step-through pattern, Trunk flexed Gait velocity: decreased     General Gait Details: Pt required cues to maintain head looking ahead of her and to increase cadence as she started with slow gait speed. Pt also requires cues to avoid picking up RW when turning. No KI required as pt performing SLR with minimal to no extensor lag.   Stairs   Stairs assistance: Min guard Stair Management: No rails, With walker Number of Stairs: 1 General stair comments: cues for sequencing/technique; pt ascended/descended platform step   Wheelchair Mobility    Modified Rankin (Stroke Patients Only)       Balance Overall balance assessment: Needs assistance Sitting-balance support: No upper extremity supported, Feet supported Sitting balance-Leahy Scale: Good     Standing balance support: Bilateral upper extremity supported, During functional activity Standing balance-Leahy Scale: Poor Standing balance comment: reliant on RW                            Cognition Arousal/Alertness: Awake/alert Behavior During Therapy: Flat affect Overall Cognitive Status: Impaired/Different from baseline Area of Impairment: Attention, Memory, Following commands, Safety/judgement, Awareness, Problem solving, Orientation  Orientation Level: Time (thinks it is 2024 but able to  correct with cues) Current Attention Level: Sustained Memory: Decreased short-term memory, Decreased recall of precautions Following Commands: Follows one step commands inconsistently Safety/Judgement: Decreased awareness of safety, Decreased awareness of deficits Awareness: Emergent Problem Solving: Slow processing, Decreased initiation, Difficulty sequencing, Requires tactile cues          Exercises Total Joint Exercises Heel Slides: Left, 10 reps, Supine Straight Leg Raises: Left, Supine (5 reps)    General Comments        Pertinent Vitals/Pain Pain Assessment Pain Assessment: 0-10 Pain Score: 0-No pain Pain Location: L knee Pain Descriptors / Indicators: Grimacing, Operative site guarding    Home Living                          Prior Function            PT Goals (current goals can now be found in the care plan section) Acute Rehab PT Goals Patient Stated Goal: to return home PT Goal Formulation: With patient Time For Goal Achievement: 10/15/21 Potential to Achieve Goals: Good Progress towards PT goals: Progressing toward goals    Frequency    7X/week      PT Plan Current plan remains appropriate    Co-evaluation              AM-PAC PT "6 Clicks" Mobility   Outcome Measure  Help needed turning from your back to your side while in a flat bed without using bedrails?: A Little Help needed moving from lying on your back to sitting on the side of a flat bed without using bedrails?: A Little Help needed moving to and from a bed to a chair (including a wheelchair)?: A Little Help needed standing up from a chair using your arms (e.g., wheelchair or bedside chair)?: A Little Help needed to walk in hospital room?: A Little Help needed climbing 3-5 steps with a railing? : A Little 6 Click Score: 18    End of Session Equipment Utilized During Treatment: Gait belt;Left knee immobilizer Activity Tolerance: Patient tolerated treatment  well Patient left: in bed;with call bell/phone within reach;with bed alarm set;with family/visitor present Nurse Communication: Mobility status PT Visit Diagnosis: Other abnormalities of gait and mobility (R26.89);Difficulty in walking, not elsewhere classified (R26.2);Pain Pain - Right/Left: Left Pain - part of body: Knee     Time: 1205-1234 PT Time Calculation (min) (ACUTE ONLY): 29 min  Charges:  $Gait Training: 8-22 mins $Therapeutic Exercise: 8-22 mins                     Tana Coast, PT    [employer] 10/07/2021, 1:39 PM

## 2021-10-13 DIAGNOSIS — M1712 Unilateral primary osteoarthritis, left knee: Secondary | ICD-10-CM

## 2021-10-16 ENCOUNTER — Ambulatory Visit (INDEPENDENT_AMBULATORY_CARE_PROVIDER_SITE_OTHER): Payer: Medicare HMO | Admitting: Orthopedic Surgery

## 2021-10-16 ENCOUNTER — Ambulatory Visit (INDEPENDENT_AMBULATORY_CARE_PROVIDER_SITE_OTHER): Payer: Medicare HMO

## 2021-10-16 DIAGNOSIS — Z96652 Presence of left artificial knee joint: Secondary | ICD-10-CM

## 2021-10-16 DIAGNOSIS — M1712 Unilateral primary osteoarthritis, left knee: Secondary | ICD-10-CM | POA: Diagnosis not present

## 2021-10-19 ENCOUNTER — Encounter: Payer: Self-pay | Admitting: Orthopedic Surgery

## 2021-10-19 NOTE — Progress Notes (Signed)
Post-Op Visit Note   Patient: Caroline [name]           Date of Birth: 27-Jun-1950           MRN: [id number] Visit Date: 10/16/2021 PCP: Aliene Beams, MD   Assessment & Plan:  Chief Complaint:  Chief Complaint  Patient presents with   Left Knee - Routine Post Op    10/01/21 left TKA   Visit Diagnoses:  1. Arthritis of left knee   2. History of total knee arthroplasty, left     Plan: Patient underwent left total knee replacement 2 weeks ago.  Patient has been doing well.  Taking Tylenol only for pain.  CPM is at 80 degrees.  Her range of motion by therapy from home health is 0-96.  On examination the incision is intact.  Alignment intact.  Good stability on examination.  No calf tenderness negative Homans.  Plan is to start outpatient therapy here 3 times a week for 4 weeks with 4-week return.  Radiographs look good today.  Follow-Up Instructions: No follow-ups on file.   Orders:  Orders Placed This Encounter  Procedures   XR Knee 1-2 Views Left   Ambulatory referral to Physical Therapy   No orders of the defined types were placed in this encounter.   Imaging: No results found.  PMFS History: Patient Active Problem List   Diagnosis Date Noted   Arthritis of left knee    Acute encephalopathy/delirium  10/04/2021   S/P total knee arthroplasty, left 10/01/2021   Postcalcaneal bursitis of right foot 03/11/2021   Primary osteoarthritis of both knees 03/11/2021   Chronic pain of both knees 03/11/2021   Asthma due to seasonal allergies 11/17/2019   Osteopenia of multiple sites 10/19/2018   Chronic sinusitis 06/10/2018   Essential hypertension, benign 10/22/2017   Benign hematuria 06/17/2017   Pure hypercholesterolemia 08/11/2016   Anxiety and depression 08/11/2016   Chronic migraine without aura without status migrainosus, not intractable 08/11/2016   Past Medical History:  Diagnosis Date   Allergy    Zyrtec, Benadryl   Anxiety    Zoloft since several years    Asthma    Balance problem    Cataract    Complication of anesthesia    GERD (gastroesophageal reflux disease)    Hyperlipidemia    Hypertension    Memory changes    Migraines    topmax and Imitrex    Family History  Problem Relation Age of Onset   Hypertension Mother    Stroke Mother    Dementia Mother    Cancer Father 3       oral cancer   Heart disease Father        PAD/femoral bypass   Hypertension Father    Hyperlipidemia Father    Alcohol abuse Father    Hyperlipidemia Sister    Hypertension Sister    Breast cancer Maternal Aunt     Past Surgical History:  Procedure Laterality Date   ABDOMINAL HYSTERECTOMY  2016   uterine prolapse; ovaries INTACT   BLADDER REPAIR     BREAST BIOPSY     NL   BREAST EXCISIONAL BIOPSY     BREAST SURGERY     breast bx x 2 in 1980s; benign   CARPAL TUNNEL RELEASE Bilateral    CATARACT EXTRACTION, BILATERAL  2018   TOTAL KNEE ARTHROPLASTY Left 10/01/2021   Procedure: LEFT TOTAL KNEE ARTHROPLASTY;  Surgeon: Cammy Copa, MD;  Location: [facility] OR;  Service: Orthopedics;  Laterality: Left;   Social History   Occupational History   Occupation: employed  Tobacco Use   Smoking status: Former    Types: Cigarettes    Quit date: 04/07/1968    Years since quitting: 53.5   Smokeless tobacco: Never  Vaping Use   Vaping Use: Never used  Substance and Sexual Activity   Alcohol use: Yes    Comment: rarely wine   Drug use: No   Sexual activity: Yes    Birth control/protection: Post-menopausal, Surgical

## 2021-10-22 ENCOUNTER — Telehealth: Payer: Self-pay | Admitting: Orthopedic Surgery

## 2021-10-22 NOTE — Telephone Encounter (Signed)
Caroline [name] has not heard from PT department regarding her referral from [facility]. She would like to have a referral sent over to [facility] on [facility] to complete her PT. The fax number for that office is [phone]

## 2021-10-23 ENCOUNTER — Telehealth: Payer: Self-pay | Admitting: Orthopedic Surgery

## 2021-10-23 NOTE — Telephone Encounter (Signed)
See other note

## 2021-10-23 NOTE — Telephone Encounter (Signed)
Pt called requesting to go to a different physical therapy facility. Pt is asking for physical therapy referral be sent to [facility] on [facility]. Please call pt about this matter at [phone].

## 2021-10-24 ENCOUNTER — Encounter: Payer: Self-pay | Admitting: Physical Therapy

## 2021-10-24 ENCOUNTER — Ambulatory Visit: Payer: Medicare HMO | Admitting: Physical Therapy

## 2021-10-24 ENCOUNTER — Other Ambulatory Visit: Payer: Self-pay

## 2021-10-24 DIAGNOSIS — R2689 Other abnormalities of gait and mobility: Secondary | ICD-10-CM

## 2021-10-24 DIAGNOSIS — R600 Localized edema: Secondary | ICD-10-CM

## 2021-10-24 DIAGNOSIS — M25562 Pain in left knee: Secondary | ICD-10-CM | POA: Diagnosis not present

## 2021-10-24 DIAGNOSIS — M6281 Muscle weakness (generalized): Secondary | ICD-10-CM | POA: Diagnosis not present

## 2021-10-24 NOTE — Therapy (Addendum)
OUTPATIENT PHYSICAL THERAPY LOWER EXTREMITY EVALUATION   Patient Name: Caroline [name] MRN: [id number] DOB:03/04/1951, 71 y.o., female Today's Date: 10/24/2021   PT End of Session - 10/24/21 1154     Visit Number 1    Number of Visits 15    Date for PT Re-Evaluation 12/19/21    Authorization Type [employer] M[employer]    PT Start Time 1105    PT Stop Time 1149    PT Time Calculation (min) 44 min    Activity Tolerance Patient tolerated treatment well    Behavior During Therapy WFL for tasks assessed/performed             Past Medical History:  Diagnosis Date   Allergy    Zyrtec, Benadryl   Anxiety    Zoloft since several years   Asthma    Balance problem    Cataract    Complication of anesthesia    GERD (gastroesophageal reflux disease)    Hyperlipidemia    Hypertension    Memory changes    Migraines    topmax and Imitrex   Past Surgical History:  Procedure Laterality Date   ABDOMINAL HYSTERECTOMY  2016   uterine prolapse; ovaries INTACT   BLADDER REPAIR     BREAST BIOPSY     NL   BREAST EXCISIONAL BIOPSY     BREAST SURGERY     breast bx x 2 in 1980s; benign   CARPAL TUNNEL RELEASE Bilateral    CATARACT EXTRACTION, BILATERAL  2018   TOTAL KNEE ARTHROPLASTY Left 10/01/2021   Procedure: LEFT TOTAL KNEE ARTHROPLASTY;  Surgeon: Cammy Copa, MD;  Location: [facility] OR;  Service: Orthopedics;  Laterality: Left;   Patient Active Problem List   Diagnosis Date Noted   Arthritis of left knee    Acute encephalopathy/delirium  10/04/2021   S/P total knee arthroplasty, left 10/01/2021   Postcalcaneal bursitis of right foot 03/11/2021   Primary osteoarthritis of both knees 03/11/2021   Chronic pain of both knees 03/11/2021   Asthma due to seasonal allergies 11/17/2019   Osteopenia of multiple sites 10/19/2018   Chronic sinusitis 06/10/2018   Essential hypertension, benign 10/22/2017   Benign hematuria 06/17/2017   Pure hypercholesterolemia 08/11/2016   Anxiety and depression  08/11/2016   Chronic migraine without aura without status migrainosus, not intractable 08/11/2016    PCP: Aliene Beams, MD  REFERRING PROVIDER: Cammy Copa, MD  REFERRING DIAG: [id number] (ICD-10-CM) - History of total knee arthroplasty, left  THERAPY DIAG:  Acute pain of left knee  Muscle weakness (generalized)  Other abnormalities of gait and mobility  Localized edema  Rationale for Evaluation and Treatment Rehabilitation  ONSET DATE: 10/01/2021  Left TKA  SUBJECTIVE:   SUBJECTIVE STATEMENT:  Patient underwent a left Total Knee Arthroplasty on 10/01/2021 due to OA. She had about 8 HHPT visits that ended last week.  PERTINENT HISTORY: OA, acure encephalopathy 2023, asthma, postcalcaneal bursitis, osteopenia, anxiety, depression, balance problem, HTN  PAIN:  Are you having pain? Yes: NPRS scale: 3-4 currently and 8 at worse/10 Pain location: anterior mostly Pain description: achy, dull Aggravating factors: sit too long, then go to get up, wakes up at night with pain Relieving factors: moving, ice, voltaren, NSAIDs  PRECAUTIONS: None  WEIGHT BEARING RESTRICTIONS No  FALLS:  Has patient fallen in last 6 months? Yes. Number of falls fell one time at hospital before surgery. Does not have a fear of falling but does want to work on balance some  LIVING ENVIRONMENT: Lives  with: lives with their spouse Lives in: townhome Stairs: No   OCCUPATION: retired  PLOF: Independent  PATIENT GOALS : be able to walk comfortably and walk normal.    OBJECTIVE:   DIAGNOSTIC FINDINGS: 10/16/2021  AP lateral radiographs left knee reviewed.  Total knee prosthesis in good position alignment with no complicating factors.  PATIENT SURVEYS:  FOTO will need her to do 2nd visit  COGNITION:  Overall cognitive status: Within functional limits for tasks assessed     SENSATION: Light touch: WFL  EDEMA:  Mild to Moderate edema in Left knee MUSCLE LENGTH: Hamstrings:  Thomas  test:    POSTURE:   PALPATION: TTP medial knee  LOWER EXTREMITY ROM:  ROM P:passive  A:active Right eval Left eval  Hip flexion    Hip extension    Hip abduction    Hip adduction    Hip internal rotation    Hip external rotation    Knee flexion  A:92 P:99  Knee extension  A:2 P:0  Ankle dorsiflexion    Ankle plantarflexion    Ankle inversion    Ankle eversion     (Blank rows = not tested)  LOWER EXTREMITY MMT:  MMT Right eval Left eval  Hip flexion    Hip extension    Hip abduction    Hip adduction    Hip internal rotation    Hip external rotation    Knee flexion 5 4  Knee extension 5 4  Ankle dorsiflexion    Ankle plantarflexion    Ankle inversion    Ankle eversion     (Blank rows = not tested)  LOWER EXTREMITY SPECIAL TESTS:    FUNCTIONAL TESTS:  5 times sit to stand:  10/24/21: 5 times sit to stand test, no UE support 13.03 seconds  GAIT: Distance walked: 50 feet Assistive device utilized: Single point cane Level of assistance: Modified independence Comments: walking well up to this point post op, does not use AD PLOF    TODAY'S TREATMENT:  10/24/21 Reviewed and performed HEP listed below   PATIENT EDUCATION:  Education details: HEP, PT plan of care Person educated: Patient Education method: Explanation, Demonstration, Verbal cues, and Handouts Education comprehension: verbalized understanding, returned demonstration, and needs further education   HOME EXERCISE PROGRAM: Access Code: [email or web address] URL: [email or web address] Date: 10/24/2021 Prepared by: Elsie Ra  Exercises - Supine Heel Slide with Strap  - 3 x daily - 6 x weekly - 1-2 sets - 10 reps - 5-10 sec hold - Heel Prop  - 3 x daily - 6 x weekly - 1 sets - 1 reps - 5-10 min hold - Standing Gastroc Stretch at Counter  - 3 x daily - 6 x weekly - 1 sets - 3 reps - 30 hold - Standing Hip Abduction  - 3 x daily - 6 x weekly - 1-2 sets - 10 reps - Seated Knee Flexion  Stretch  - 3 x daily - 6 x weekly - 1-2 sets - 10 reps - 5 sec hold -also already doing mini squats and heel raises with current HEP    ASSESSMENT:  CLINICAL IMPRESSION: Patient referred to PT S/P Left TKA on 10/01/21. She is doing very well up to this point. Patient will benefit from skilled PT to address below impairments, limitations and improve overall function.  OBJECTIVE IMPAIRMENTS: decreased activity tolerance, difficulty walking, decreased balance, decreased endurance, decreased mobility, decreased ROM, decreased strength, impaired flexibility, impaired LE use, postural dysfunction, and pain.  ACTIVITY LIMITATIONS: bending,  lifting, carry, locomotion, cleaning, community activity, driving,   PERSONAL FACTORS: OA, acure encephalopathy 2023, asthma, postcalcaneal bursitis, osteopenia, anxiety, depression, balance problem, HTN are also affecting patient's functional outcome.  REHAB POTENTIAL: Excellent  CLINICAL DECISION MAKING: Stable/uncomplicated  EVALUATION COMPLEXITY: Low    GOALS: Short term PT Goals Target date: 11/21/2021 Pt will be I and compliant with HEP. Baseline:  Goal status: New Pt will decrease pain by 25% overall Baseline: Goal status: New  Long term PT goals Target date: 12/19/2021 Pt will improve Lt knee PROM 0-120 to improve functional mobility Baseline: Goal status: New Pt will improve  left hip/knee strength to at least 5-/5 MMT in sitting to improve functional strength Baseline: Goal status: New Pt will improve FOTO to at least predicted value functional to show improved function Baseline: Goal status: New Pt will reduce pain to overall less than 2-3/10 with usual activity Baseline: Goal status: New Pt will be able to ambulate community distances at least 1000 ft and navigate stairs WNL gait pattern without  AD and without complaints Baseline: Goal status: New  PLAN: PT FREQUENCY: 1-3 times per week   PT DURATION: 6-8 weeks  PLANNED  INTERVENTIONS (unless contraindicated): aquatic PT, Canalith repositioning, cryotherapy, Electrical stimulation, Iontophoresis with 4 mg/ml dexamethasome, Moist heat, traction, Ultrasound, gait training, Therapeutic exercise, balance training, neuromuscular re-education, patient/family education, prosthetic training, manual techniques, passive ROM, dry needling, taping, vasopnuematic device, vestibular, spinal manipulations, joint manipulations  PLAN FOR NEXT SESSION: have her due FOTO. review HEP, nu step or bike, ROM, strength, gait progressions as able.   Ivery Quale, PT, DPT 10/24/21 12:01 PM  Referring diagnosis? Q59.563 (ICD-10-CM) - History of total knee arthroplasty, left Treatment diagnosis? (if different than referring diagnosis) m25.562 What was this (referring dx) caused by? [x]  Surgery []  Fall []  Ongoing issue [x]  Arthritis []  Other: ____________  Laterality: []  Rt [x]  Lt []  Both  Check all possible CPT codes:  *CHOOSE 10 OR LESS*    [x]  97110 (Therapeutic Exercise)  []  92507 (SLP Treatment)  [x]  97112 (Neuro Re-ed)   []  92526 (Swallowing Treatment)   [x]  97116 (Gait Training)   []  (Cognitive Training, 1st 15 minutes) [x]  97140 (Manual Therapy)   []  97130 (Cognitive Training, each add'l 15 minutes)  [x]  (Re-evaluation)                              []  Other, List CPT Code ____________  [x]  97530 (Therapeutic Activities)     [x]  97535 (Self Care)   []  All codes above (97110 - 97535)  []  97012 (Mechanical Traction)  [x]  97014 (E-stim Unattended)  []  97032 (E-stim manual)  [x]  (Ionto)  [x]  97035 (Ultrasound) []  97750 (Physical Performance Training) []  (Aquatic Therapy) []  97016 (Vasopneumatic Device) []  (Paraffin) []  97034 (Contrast Bath) []  97597 (Wound Care 1st 20 sq cm) []  97598 (Wound Care each add'l 20 sq cm) []  97760 (Orthotic Fabrication, Fitting, Training Initial) []  (Prosthetic Management and Training Initial) []   (Orthotic or Prosthetic Training/ Modification Subsequent)

## 2021-10-24 NOTE — Discharge Summary (Signed)
Physician Discharge Summary      Patient ID: Caroline [name] MRN: [id number] DOB/AGE: 1950-04-21 71 y.o.  Admit date: 10/01/2021 Discharge date: 10/07/21  Admission Diagnoses:  Principal Problem:   Acute encephalopathy/delirium  Active Problems:   Pure hypercholesterolemia   Anxiety and depression   Chronic migraine without aura without status migrainosus, not intractable   Essential hypertension, benign   S/P total knee arthroplasty, left   Arthritis of left knee   Discharge Diagnoses:  Same  Surgeries: Procedure(s): LEFT TOTAL KNEE ARTHROPLASTY on 10/01/2021   Consultants:   Discharged Condition: Stable  Hospital Course: Arij Lydy is an 71 y.o. female who was admitted 10/01/2021 with a chief complaint of left knee pain, and found to have a diagnosis of left knee OA.  They were brought to the operating room on 10/01/2021 and underwent the above named procedures.  Pt awoke from anesthesia without complication and was transferred to the floor. On POD1, patient mobilized okay with PT and her pain was controlled. However, she had difficulty with confusion that was worse on POD2 and POD3.  Hospitalist consult placed on POD3 and opioid medication discontinued. After extensive workup by hospitalist team and input from Neurology, impression was likely exacerbation of existing dementia due to hospital setting. She did improve starting on POD4 and by 10/07/21, her family and she were comfortable with discharge home.  Pt will f/u with Dr. Marlou Sa in clinic in ~2 weeks.   Antibiotics given:  Anti-infectives (From admission, onward)    Start     Dose/Rate Route Frequency Ordered Stop   10/01/21 1930  ceFAZolin (ANCEF) IVPB 1 g/50 mL premix        1 g 100 mL/hr over 30 Minutes Intravenous Every 8 hours 10/01/21 1602 10/02/21 0411   10/01/21 1246  vancomycin (VANCOCIN) powder  Status:  Discontinued          As needed 10/01/21 1246 10/01/21 1406   10/01/21 0915  ceFAZolin (ANCEF) IVPB 2g/100 mL  premix        2 g 200 mL/hr over 30 Minutes Intravenous On call to O.R. 10/01/21 [id number] 10/01/21 1136     .  Recent vital signs:  Vitals:   10/07/21 0918 10/07/21 1404  BP: 132/88 119/73  Pulse: 92 81  Resp: 18 18  Temp: 98.4 F (36.9 C) 98.7 F (37.1 C)  SpO2:  100%    Recent laboratory studies:  Results for orders placed or performed during the hospital encounter of 10/01/21  Urine Culture   Specimen: Urine, Clean Catch  Result Value Ref Range   Specimen Description URINE, CLEAN CATCH    Special Requests NONE    Culture (A)     <10,000 COLONIES/mL INSIGNIFICANT GROWTH Performed at [facility] [address] [place], [place] [address]    Report Status 10/05/2021 FINAL   CBC with Differential/Platelet  Result Value Ref Range   WBC 7.7 4.0 - 10.5 K/uL   RBC 2.75 (L) 3.87 - 5.11 MIL/uL   Hemoglobin 8.3 (L) 12.0 - 15.0 g/dL   HCT 25.0 (L) 36.0 - 46.0 %   MCV 90.9 80.0 - 100.0 fL   MCH 30.2 26.0 - 34.0 pg   MCHC 33.2 30.0 - 36.0 g/dL   RDW 14.4 11.5 - 15.5 %   Platelets 188 150 - 400 K/uL   nRBC 0.0 0.0 - 0.2 %   Neutrophils Relative % 76 %   Neutro Abs 5.9 1.7 - 7.7 K/uL   Lymphocytes Relative 13 %  Lymphs Abs 1.0 0.7 - 4.0 K/uL   Monocytes Relative 9 %   Monocytes Absolute 0.7 0.1 - 1.0 K/uL   Eosinophils Relative 1 %   Eosinophils Absolute 0.0 0.0 - 0.5 K/uL   Basophils Relative 0 %   Basophils Absolute 0.0 0.0 - 0.1 K/uL   Immature Granulocytes 1 %   Abs Immature Granulocytes 0.04 0.00 - 0.07 K/uL  Urinalysis, Routine w reflex microscopic  Result Value Ref Range   Color, Urine YELLOW YELLOW   APPearance CLOUDY (A) CLEAR   Specific Gravity, Urine 1.014 1.005 - 1.030   pH 6.0 5.0 - 8.0   Glucose, UA NEGATIVE NEGATIVE mg/dL   Hgb urine dipstick NEGATIVE NEGATIVE   Bilirubin Urine NEGATIVE NEGATIVE   Ketones, ur NEGATIVE NEGATIVE mg/dL   Protein, ur NEGATIVE NEGATIVE mg/dL   Nitrite NEGATIVE NEGATIVE   Leukocytes,Ua NEGATIVE NEGATIVE  TSH   Result Value Ref Range   TSH 1.305 0.350 - 4.500 uIU/mL  Ammonia  Result Value Ref Range   Ammonia 41 (H) 9 - 35 umol/L  Comprehensive metabolic panel  Result Value Ref Range   Sodium 137 135 - 145 mmol/L   Potassium 3.2 (L) 3.5 - 5.1 mmol/L   Chloride 106 98 - 111 mmol/L   CO2 22 22 - 32 mmol/L   Glucose, Bld 124 (H) 70 - 99 mg/dL   BUN 11 8 - 23 mg/dL   Creatinine, Ser 0.59 0.44 - 1.00 mg/dL   Calcium 8.8 (L) 8.9 - 10.3 mg/dL   Total Protein 6.2 (L) 6.5 - 8.1 g/dL   Albumin 3.3 (L) 3.5 - 5.0 g/dL   AST 18 15 - 41 U/L   ALT 14 0 - 44 U/L   Alkaline Phosphatase 58 38 - 126 U/L   Total Bilirubin 0.7 0.3 - 1.2 mg/dL   GFR, Estimated >60 >60 mL/min   Anion gap 9 5 - 15  Vitamin B12  Result Value Ref Range   Vitamin B-12 653 180 - 914 pg/mL  Magnesium  Result Value Ref Range   Magnesium 2.0 1.7 - 2.4 mg/dL  Basic metabolic panel  Result Value Ref Range   Sodium 137 135 - 145 mmol/L   Potassium 3.5 3.5 - 5.1 mmol/L   Chloride 108 98 - 111 mmol/L   CO2 22 22 - 32 mmol/L   Glucose, Bld 112 (H) 70 - 99 mg/dL   BUN 12 8 - 23 mg/dL   Creatinine, Ser 0.59 0.44 - 1.00 mg/dL   Calcium 8.8 (L) 8.9 - 10.3 mg/dL   GFR, Estimated >60 >60 mL/min   Anion gap 7 5 - 15  Basic metabolic panel  Result Value Ref Range   Sodium 140 135 - 145 mmol/L   Potassium 4.3 3.5 - 5.1 mmol/L   Chloride 109 98 - 111 mmol/L   CO2 22 22 - 32 mmol/L   Glucose, Bld 127 (H) 70 - 99 mg/dL   BUN 10 8 - 23 mg/dL   Creatinine, Ser 0.67 0.44 - 1.00 mg/dL   Calcium 9.2 8.9 - 10.3 mg/dL   GFR, Estimated >60 >60 mL/min   Anion gap 9 5 - 15  CBC  Result Value Ref Range   WBC 6.5 4.0 - 10.5 K/uL   RBC 2.68 (L) 3.87 - 5.11 MIL/uL   Hemoglobin 8.1 (L) 12.0 - 15.0 g/dL   HCT 24.8 (L) 36.0 - 46.0 %   MCV 92.5 80.0 - 100.0 fL   MCH 30.2 26.0 -  34.0 pg   MCHC 32.7 30.0 - 36.0 g/dL   RDW 14.5 11.5 - 15.5 %   Platelets 222 150 - 400 K/uL   nRBC 0.0 0.0 - 0.2 %  Magnesium  Result Value Ref Range   Magnesium  2.1 1.7 - 2.4 mg/dL    Discharge Medications:   Allergies as of 10/07/2021       Reactions   Anesthesia S-i-40 [propofol] Other (See Comments)   Causing a migraine and irritability   Oxycontin [oxycodone Hcl]    Causes migraines   Tetracyclines & Related Nausea And Vomiting   Vicodin [hydrocodone-acetaminophen] Nausea And Vomiting   Dizzy, causes migraines        Medication List     STOP taking these medications    SUMAtriptan 100 MG tablet Commonly known as: IMITREX   topiramate 50 MG tablet Commonly known as: TOPAMAX       TAKE these medications    acetaminophen 325 MG tablet Commonly known as: TYLENOL Take 1-2 tablets (325-650 mg total) by mouth every 6 (six) hours as needed for mild pain (pain score 1-3 or temp > 100.5). Notes to patient: Last dose given 07/03 05:11am   albuterol 108 (90 Base) MCG/ACT inhaler Commonly known as: VENTOLIN HFA Inhale 2 puffs into the lungs every 6 (six) hours as needed for wheezing or shortness of breath. Notes to patient: Resume home regimen   aspirin 81 MG chewable tablet Chew 1 tablet (81 mg total) by mouth 2 (two) times daily.   celecoxib 100 MG capsule Commonly known as: CELEBREX Take 1 capsule (100 mg total) by mouth 2 (two) times daily.   cetirizine 10 MG tablet Commonly known as: ZYRTEC Take 10 mg by mouth daily. Notes to patient: Resume home regimen   cyanocobalamin 500 MCG tablet Commonly known as: CYANOCOBALAMIN Take 1,000 mcg by mouth daily. Notes to patient: Resume home regimen   diclofenac Sodium 1 % Gel Commonly known as: VOLTAREN Apply 2 g topically daily as needed (achillles pain). Notes to patient: Resume home regimen   famotidine 20 MG tablet Commonly known as: PEPCID Take 1 tablet (20 mg total) by mouth 2 (two) times daily.   fluticasone 50 MCG/ACT nasal spray Commonly known as: FLONASE Place 1 spray into both nostrils 2 (two) times daily. What changed:  when to take this reasons to take  this Notes to patient: Resume home regimen   hydrOXYzine 50 MG capsule Commonly known as: VISTARIL Take 50 mg by mouth at bedtime. Notes to patient: Resume home regimen   metoprolol succinate 100 MG 24 hr tablet Commonly known as: TOPROL-XL TAKE 1 TABLET EVERY DAY. TAKE WITH OR IMMEDIATELY FOLLOWING A MEAL.   ondansetron 8 MG tablet Commonly known as: ZOFRAN Take 1 tablet (8 mg total) by mouth every 8 (eight) hours as needed for nausea or vomiting. Notes to patient: Last dose given 06/07 01:15pm   rosuvastatin 10 MG tablet Commonly known as: CRESTOR Take 10 mg by mouth at bedtime.   sertraline 100 MG tablet Commonly known as: ZOLOFT TAKE 1 AND 1/2 TABLETS EVERY DAY What changed: See the new instructions.   Symbicort 80-4.5 MCG/ACT inhaler Generic drug: budesonide-formoterol Inhale 2 puffs into the lungs 2 (two) times daily as needed (shortness of breath). Notes to patient: Resume home regimen        Diagnostic Studies: XR Knee 1-2 Views Left  Result Date: 10/23/2021 AP lateral radiographs left knee reviewed.  Total knee prosthesis in good position alignment with no  complicating factors.   MR BRAIN W WO CONTRAST  Result Date: 10/06/2021 CLINICAL DATA:  71 year old female with altered mental status. Status post knee surgery on 10/01/2021. EXAM: MRI HEAD WITHOUT AND WITH CONTRAST TECHNIQUE: Multiplanar, multiecho pulse sequences of the brain and surrounding structures were obtained without and with intravenous contrast. CONTRAST:  66mL GADAVIST GADOBUTROL 1 MMOL/ML IV SOLN COMPARISON:  Head CT 10/04/2021. Brain MRI 08/31/2021. FINDINGS: Brain: No restricted diffusion to suggest acute infarction. No midline shift, mass effect, evidence of mass lesion, ventriculomegaly, extra-axial collection or acute intracranial hemorrhage. Cervicomedullary junction and pituitary are within normal limits. Patchy and confluent, widely scattered bilateral cerebral white matter T2 and FLAIR  hyperintensity is stable since May. The pattern is nonspecific. No superimposed cortical encephalomalacia or chronic cerebral blood products identified. No abnormal enhancement identified. No dural thickening. Deep gray matter nuclei, brainstem and cerebellum appear normal for age. Vascular: Major intracranial vascular flow voids are stable. Dominant appearing distal right vertebral artery. The major dural venous sinuses are enhancing and appear to be patent. Skull and upper cervical spine: Negative visible cervical spine for age. Visualized bone marrow signal is within normal limits. Sinuses/Orbits: Stable postoperative changes to both globes, mild posterior left ethmoid sinus mucosal thickening. Other: Mastoids remain clear. Visible internal auditory structures appear normal. Negative visible scalp and face. IMPRESSION: 1. No acute intracranial abnormality. 2. Stable MRI appearance of the brain since May. 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 Electronically Signed   By: Genevie Ann M.D.   On: 10/06/2021 09:25   DG Abd 1 View  Result Date: 10/05/2021 CLINICAL DATA:  [id number] EXAM: ABDOMEN - 1 VIEW COMPARISON:  Radiograph dated December 28, 2016 FINDINGS: Nonobstructive bowel gas pattern. There is a 10 mm rectangular radiopaque density projecting over the LEFT pubic ramus. This was not present in 2018 and is in indeterminate location. Pelvic phleboliths. Degenerative changes the lumbar spine IMPRESSION: There is a 10 mm rectangular radiopaque and possibly metallic density projecting over the LEFT pubic ramus. This may be external to patient given this was not present in 2018. Recommend correlation with clinical history and physical exam. Electronically Signed   By: Valentino Saxon M.D.   On: 10/05/2021 17:38   CT HEAD WO CONTRAST (5MM)  Result Date: 10/04/2021 CLINICAL DATA:  Altered mental status EXAM: CT HEAD WITHOUT CONTRAST TECHNIQUE: Contiguous axial images were obtained from the base of  the skull through the vertex without intravenous contrast. RADIATION DOSE REDUCTION: This exam was performed according to the departmental dose-optimization program which includes automated exposure control, adjustment of the mA and/or kV according to patient size and/or use of iterative reconstruction technique. COMPARISON:  MR 08/31/2021 and previous FINDINGS: Brain: Diffuse parenchymal atrophy. Patchy areas of hypoattenuation in deep and periventricular white matter bilaterally. Negative for acute intracranial hemorrhage, mass lesion, acute infarction, midline shift, or mass-effect. Acute infarct may be inapparent on noncontrast CT. Ventricles and sulci symmetric. Vascular: No hyperdense vessel or unexpected calcification. Skull: Normal. Negative for fracture or focal lesion. Sinuses/Orbits: No acute finding. Other: None IMPRESSION: 1. Negative for bleed or other acute intracranial process. 2. Atrophy and nonspecific white matter changes. Electronically Signed   By: Lucrezia Europe M.D.   On: 10/04/2021 12:15   DG CHEST PORT 1 VIEW  Result Date: 10/04/2021 CLINICAL DATA:  Altered mental status, acute encephalopathy. EXAM: PORTABLE CHEST 1 VIEW COMPARISON:  Chest radiograph 12/28/2016 FINDINGS: Decreased lung volumes. Heart size is prominent and likely related to the low lung volumes. Subtle densities  at the lateral left lung base and there is a focal linear or bandlike density at the right lung base. Lung base findings are suggestive for atelectasis. Negative for a pneumothorax. IMPRESSION: 1. Low lung volumes. 2. Bibasilar lung densities are most compatible with atelectasis. Electronically Signed   By: Markus Daft M.D.   On: 10/04/2021 11:24    Disposition: Discharge disposition: 01-Home or Self Care       Discharge Instructions     Call MD / Call 911   Complete by: As directed    If you experience chest pain or shortness of breath, CALL 911 and be transported to the hospital emergency room.  If you  develope a fever above 101 F, pus (white drainage) or increased drainage or redness at the wound, or calf pain, call your surgeon's office.   Call MD / Call 911   Complete by: As directed    If you experience chest pain or shortness of breath, CALL 911 and be transported to the hospital emergency room.  If you develope a fever above 101 F, pus (white drainage) or increased drainage or redness at the wound, or calf pain, call your surgeon's office.   Constipation Prevention   Complete by: As directed    Drink plenty of fluids.  Prune juice may be helpful.  You may use a stool softener, such as Colace (over the counter) 100 mg twice a day.  Use MiraLax (over the counter) for constipation as needed.   Constipation Prevention   Complete by: As directed    Drink plenty of fluids.  Prune juice may be helpful.  You may use a stool softener, such as Colace (over the counter) 100 mg twice a day.  Use MiraLax (over the counter) for constipation as needed.   Diet - low sodium heart healthy   Complete by: As directed    Diet - low sodium heart healthy   Complete by: As directed    Discharge instructions   Complete by: As directed    You may shower, dressing is waterproof.  Do not remove the dressing, we will remove it at your first post-op appointment.  Do not take a bath or soak the knee in a tub or pool.  You may weightbear as you can tolerate on the operative leg with a walker.  Continue using the CPM machine 3 times per day for one hour each time, increasing the degrees of range of motion daily.  Use the blue cradle boot under your heel to work on getting your leg straight.  Do NOT put a pillow under your knee.  You will follow-up with Dr. Marlou Sa in the clinic in 2 weeks at your given appointment date.    INSTRUCTIONS AFTER JOINT REPLACEMENT   Remove items at home which could result in a fall. This includes throw rugs or furniture in walking pathways ICE to the affected joint every three hours while awake  for 30 minutes at a time, for at least the first 3-5 days, and then as needed for pain and swelling.  Continue to use ice for pain and swelling. You may notice swelling that will progress down to the foot and ankle.  This is normal after surgery.  Elevate your leg when you are not up walking on it.   Continue to use the breathing machine you got in the hospital (incentive spirometer) which will help keep your temperature down.  It is common for your temperature to cycle up and down  following surgery, especially at night when you are not up moving around and exerting yourself.  The breathing machine keeps your lungs expanded and your temperature down.   DIET:  As you were doing prior to hospitalization, we recommend a well-balanced diet.  DRESSING / WOUND CARE / SHOWERING  Keep the surgical dressing until follow up.  The dressing is water proof, so you can shower without any extra covering.  IF THE DRESSING FALLS OFF or the wound gets wet inside, change the dressing with sterile gauze.  Please use good hand washing techniques before changing the dressing.  Do not use any lotions or creams on the incision until instructed by your surgeon.    ACTIVITY  Increase activity slowly as tolerated, but follow the weight bearing instructions below.   No driving for 6 weeks or until further direction given by your physician.  You cannot drive while taking narcotics.  No lifting or carrying greater than 10 lbs. until further directed by your surgeon. Avoid periods of inactivity such as sitting longer than an hour when not asleep. This helps prevent blood clots.  You may return to work once you are authorized by your doctor.     WEIGHT BEARING   Weight bearing as tolerated with assist device (walker, cane, etc) as directed, use it as long as suggested by your surgeon or therapist, typically at least 4-6 weeks.   EXERCISES  Results after joint replacement surgery are often greatly improved when you follow  the exercise, range of motion and muscle strengthening exercises prescribed by your doctor. Safety measures are also important to protect the joint from further injury. Any time any of these exercises cause you to have increased pain or swelling, decrease what you are doing until you are comfortable again and then slowly increase them. If you have problems or questions, call your caregiver or physical therapist for advice.   Rehabilitation is important following a joint replacement. After just a few days of immobilization, the muscles of the leg can become weakened and shrink (atrophy).  These exercises are designed to build up the tone and strength of the thigh and leg muscles and to improve motion. Often times heat used for twenty to thirty minutes before working out will loosen up your tissues and help with improving the range of motion but do not use heat for the first two weeks following surgery (sometimes heat can increase post-operative swelling).   These exercises can be done on a training (exercise) mat, on the floor, on a table or on a bed. Use whatever works the best and is most comfortable for you.    Use music or television while you are exercising so that the exercises are a pleasant break in your day. This will make your life better with the exercises acting as a break in your routine that you can look forward to.   Perform all exercises about fifteen times, three times per day or as directed.  You should exercise both the operative leg and the other leg as well.  Exercises include:   Quad Sets - Tighten up the muscle on the front of the thigh (Quad) and hold for 5-10 seconds.   Straight Leg Raises - With your knee straight (if you were given a brace, keep it on), lift the leg to 60 degrees, hold for 3 seconds, and slowly lower the leg.  Perform this exercise against resistance later as your leg gets stronger.  Leg Slides: Lying on your back, slowly  slide your foot toward your buttocks,  bending your knee up off the floor (only go as far as is comfortable). Then slowly slide your foot back down until your leg is flat on the floor again.  Angel Wings: Lying on your back spread your legs to the side as far apart as you can without causing discomfort.  Hamstring Strength:  Lying on your back, push your heel against the floor with your leg straight by tightening up the muscles of your buttocks.  Repeat, but this time bend your knee to a comfortable angle, and push your heel against the floor.  You may put a pillow under the heel to make it more comfortable if necessary.   A rehabilitation program following joint replacement surgery can speed recovery and prevent re-injury in the future due to weakened muscles. Contact your doctor or a physical therapist for more information on knee rehabilitation.    CONSTIPATION  Constipation is defined medically as fewer than three stools per week and severe constipation as less than one stool per week.  Even if you have a regular bowel pattern at home, your normal regimen is likely to be disrupted due to multiple reasons following surgery.  Combination of anesthesia, postoperative narcotics, change in appetite and fluid intake all can affect your bowels.   YOU MUST use at least one of the following options; they are listed in order of increasing strength to get the job done.  They are all available over the counter, and you may need to use some, POSSIBLY even all of these options:    Drink plenty of fluids (prune juice may be helpful) and high fiber foods Colace 100 mg by mouth twice a day  Senokot for constipation as directed and as needed Dulcolax (bisacodyl), take with full glass of water  Miralax (polyethylene glycol) once or twice a day as needed.  If you have tried all these things and are unable to have a bowel movement in the first 3-4 days after surgery call either your surgeon or your primary doctor.    If you experience loose stools or  diarrhea, hold the medications until you stool forms back up.  If your symptoms do not get better within 1 week or if they get worse, check with your doctor.  If you experience "the worst abdominal pain ever" or develop nausea or vomiting, please contact the office immediately for further recommendations for treatment.   ITCHING:  If you experience itching with your medications, try taking only a single pain pill, or even half a pain pill at a time.  You can also use Benadryl over the counter for itching or also to help with sleep.   TED HOSE STOCKINGS:  Use stockings on both legs until for at least 2 weeks or as directed by physician office. They may be removed at night for sleeping.  MEDICATIONS:  See your medication summary on the "After Visit Summary" that nursing will review with you.  You may have some home medications which will be placed on hold until you complete the course of blood thinner medication.  It is important for you to complete the blood thinner medication as prescribed.  PRECAUTIONS:  If you experience chest pain or shortness of breath - call 911 immediately for transfer to the hospital emergency department.   If you develop a fever greater that 101 F, purulent drainage from wound, increased redness or drainage from wound, foul odor from the wound/dressing, or calf pain - CONTACT YOUR  SURGEON.                                                   FOLLOW-UP APPOINTMENTS:  If you do not already have a post-op appointment, please call the office for an appointment to be seen by your surgeon.  Guidelines for how soon to be seen are listed in your "After Visit Summary", but are typically between 1-4 weeks after surgery.  OTHER INSTRUCTIONS:   Knee Replacement:  Do not place pillow under knee, focus on keeping the knee straight while resting. CPM instructions: 0-90 degrees, 2 hours in the morning, 2 hours in the afternoon, and 2 hours in the evening. Place foam block, curve side up under  heel at all times except when in CPM or when walking.  DO NOT modify, tear, cut, or change the foam block in any way.  POST-OPERATIVE OPIOID TAPER INSTRUCTIONS: It is important to wean off of your opioid medication as soon as possible. If you do not need pain medication after your surgery it is ok to stop day one. Opioids include: Codeine, Hydrocodone(Norco, Vicodin), Oxycodone(Percocet, oxycontin) and hydromorphone amongst others.  Long term and even short term use of opiods can cause: Increased pain response Dependence Constipation Depression Respiratory depression And more.  Withdrawal symptoms can include Flu like symptoms Nausea, vomiting And more Techniques to manage these symptoms Hydrate well Eat regular healthy meals Stay active Use relaxation techniques(deep breathing, meditating, yoga) Do Not substitute Alcohol to help with tapering If you have been on opioids for less than two weeks and do not have pain than it is ok to stop all together.  Plan to wean off of opioids This plan should start within one week post op of your joint replacement. Maintain the same interval or time between taking each dose and first decrease the dose.  Cut the total daily intake of opioids by one tablet each day Next start to increase the time between doses. The last dose that should be eliminated is the evening dose.   MAKE SURE YOU:  Understand these instructions.  Get help right away if you are not doing well or get worse.    Thank you for letting us be a part of your medical care team.  It is a privilege we respect greatly.  We hope these instructions will help you stay on track for a fast and full recovery!    Dental Antibiotics:  In most cases prophylactic antibiotics for Dental procdeures after total joint surgery are not necessary.  Exceptions are as follows:  1. History of prior total joint infection  2. Severely immunocompromised (Organ Transplant, cancer chemotherapy,  Rheumatoid biologic meds such as Humera)  3. Poorly controlled diabetes (A1C &gt; 8.0, blood glucose over 200)  If you have one of these conditions, contact your surgeon for an antibiotic prescription, prior to your dental procedure.   Discharge instructions   Complete by: As directed    You may shower, dressing is waterproof.  Do not remove the dressing, we will remove it at your first post-op appointment.  Do not take a bath or soak the knee in a tub or pool.  You may weightbear as you can tolerate on the operative leg with a walker.  Continue using the CPM machine 3 times per day for one hour each time, increasing the degrees  of range of motion daily.  Use the blue cradle boot under your heel to work on getting your leg straight.  Do NOT put a pillow under your knee.  You will follow-up with Dr. Marlou Sa in the clinic in 2 weeks at your given appointment date.    INSTRUCTIONS AFTER JOINT REPLACEMENT   Remove items at home which could result in a fall. This includes throw rugs or furniture in walking pathways ICE to the affected joint every three hours while awake for 30 minutes at a time, for at least the first 3-5 days, and then as needed for pain and swelling.  Continue to use ice for pain and swelling. You may notice swelling that will progress down to the foot and ankle.  This is normal after surgery.  Elevate your leg when you are not up walking on it.   Continue to use the breathing machine you got in the hospital (incentive spirometer) which will help keep your temperature down.  It is common for your temperature to cycle up and down following surgery, especially at night when you are not up moving around and exerting yourself.  The breathing machine keeps your lungs expanded and your temperature down.   DIET:  As you were doing prior to hospitalization, we recommend a well-balanced diet.  DRESSING / WOUND CARE / SHOWERING  Keep the surgical dressing until follow up.  The dressing is  water proof, so you can shower without any extra covering.  IF THE DRESSING FALLS OFF or the wound gets wet inside, change the dressing with sterile gauze.  Please use good hand washing techniques before changing the dressing.  Do not use any lotions or creams on the incision until instructed by your surgeon.    ACTIVITY  Increase activity slowly as tolerated, but follow the weight bearing instructions below.   No driving for 6 weeks or until further direction given by your physician.  You cannot drive while taking narcotics.  No lifting or carrying greater than 10 lbs. until further directed by your surgeon. Avoid periods of inactivity such as sitting longer than an hour when not asleep. This helps prevent blood clots.  You may return to work once you are authorized by your doctor.     WEIGHT BEARING   Weight bearing as tolerated with assist device (walker, cane, etc) as directed, use it as long as suggested by your surgeon or therapist, typically at least 4-6 weeks.   EXERCISES  Results after joint replacement surgery are often greatly improved when you follow the exercise, range of motion and muscle strengthening exercises prescribed by your doctor. Safety measures are also important to protect the joint from further injury. Any time any of these exercises cause you to have increased pain or swelling, decrease what you are doing until you are comfortable again and then slowly increase them. If you have problems or questions, call your caregiver or physical therapist for advice.   Rehabilitation is important following a joint replacement. After just a few days of immobilization, the muscles of the leg can become weakened and shrink (atrophy).  These exercises are designed to build up the tone and strength of the thigh and leg muscles and to improve motion. Often times heat used for twenty to thirty minutes before working out will loosen up your tissues and help with improving the range of  motion but do not use heat for the first two weeks following surgery (sometimes heat can increase post-operative swelling).  These exercises can be done on a training (exercise) mat, on the floor, on a table or on a bed. Use whatever works the best and is most comfortable for you.    Use music or television while you are exercising so that the exercises are a pleasant break in your day. This will make your life better with the exercises acting as a break in your routine that you can look forward to.   Perform all exercises about fifteen times, three times per day or as directed.  You should exercise both the operative leg and the other leg as well.  Exercises include:   Quad Sets - Tighten up the muscle on the front of the thigh (Quad) and hold for 5-10 seconds.   Straight Leg Raises - With your knee straight (if you were given a brace, keep it on), lift the leg to 60 degrees, hold for 3 seconds, and slowly lower the leg.  Perform this exercise against resistance later as your leg gets stronger.  Leg Slides: Lying on your back, slowly slide your foot toward your buttocks, bending your knee up off the floor (only go as far as is comfortable). Then slowly slide your foot back down until your leg is flat on the floor again.  Angel Wings: Lying on your back spread your legs to the side as far apart as you can without causing discomfort.  Hamstring Strength:  Lying on your back, push your heel against the floor with your leg straight by tightening up the muscles of your buttocks.  Repeat, but this time bend your knee to a comfortable angle, and push your heel against the floor.  You may put a pillow under the heel to make it more comfortable if necessary.   A rehabilitation program following joint replacement surgery can speed recovery and prevent re-injury in the future due to weakened muscles. Contact your doctor or a physical therapist for more information on knee rehabilitation.     CONSTIPATION  Constipation is defined medically as fewer than three stools per week and severe constipation as less than one stool per week.  Even if you have a regular bowel pattern at home, your normal regimen is likely to be disrupted due to multiple reasons following surgery.  Combination of anesthesia, postoperative narcotics, change in appetite and fluid intake all can affect your bowels.   YOU MUST use at least one of the following options; they are listed in order of increasing strength to get the job done.  They are all available over the counter, and you may need to use some, POSSIBLY even all of these options:    Drink plenty of fluids (prune juice may be helpful) and high fiber foods Colace 100 mg by mouth twice a day  Senokot for constipation as directed and as needed Dulcolax (bisacodyl), take with full glass of water  Miralax (polyethylene glycol) once or twice a day as needed.  If you have tried all these things and are unable to have a bowel movement in the first 3-4 days after surgery call either your surgeon or your primary doctor.    If you experience loose stools or diarrhea, hold the medications until you stool forms back up.  If your symptoms do not get better within 1 week or if they get worse, check with your doctor.  If you experience "the worst abdominal pain ever" or develop nausea or vomiting, please contact the office immediately for further recommendations for treatment.   ITCHING:  If you experience itching with your medications, try taking only a single pain pill, or even half a pain pill at a time.  You can also use Benadryl over the counter for itching or also to help with sleep.   TED HOSE STOCKINGS:  Use stockings on both legs until for at least 2 weeks or as directed by physician office. They may be removed at night for sleeping.  MEDICATIONS:  See your medication summary on the "After Visit Summary" that nursing will review with you.  You may have some  home medications which will be placed on hold until you complete the course of blood thinner medication.  It is important for you to complete the blood thinner medication as prescribed.  PRECAUTIONS:  If you experience chest pain or shortness of breath - call 911 immediately for transfer to the hospital emergency department.   If you develop a fever greater that 101 F, purulent drainage from wound, increased redness or drainage from wound, foul odor from the wound/dressing, or calf pain - CONTACT YOUR SURGEON.                                                   FOLLOW-UP APPOINTMENTS:  If you do not already have a post-op appointment, please call the office for an appointment to be seen by your surgeon.  Guidelines for how soon to be seen are listed in your "After Visit Summary", but are typically between 1-4 weeks after surgery.  OTHER INSTRUCTIONS:   Knee Replacement:  Do not place pillow under knee, focus on keeping the knee straight while resting. CPM instructions: 0-90 degrees, 2 hours in the morning, 2 hours in the afternoon, and 2 hours in the evening. Place foam block, curve side up under heel at all times except when in CPM or when walking.  DO NOT modify, tear, cut, or change the foam block in any way.  POST-OPERATIVE OPIOID TAPER INSTRUCTIONS: It is important to wean off of your opioid medication as soon as possible. If you do not need pain medication after your surgery it is ok to stop day one. Opioids include: Codeine, Hydrocodone(Norco, Vicodin), Oxycodone(Percocet, oxycontin) and hydromorphone amongst others.  Long term and even short term use of opiods can cause: Increased pain response Dependence Constipation Depression Respiratory depression And more.  Withdrawal symptoms can include Flu like symptoms Nausea, vomiting And more Techniques to manage these symptoms Hydrate well Eat regular healthy meals Stay active Use relaxation techniques(deep breathing, meditating,  yoga) Do Not substitute Alcohol to help with tapering If you have been on opioids for less than two weeks and do not have pain than it is ok to stop all together.  Plan to wean off of opioids This plan should start within one week post op of your joint replacement. Maintain the same interval or time between taking each dose and first decrease the dose.  Cut the total daily intake of opioids by one tablet each day Next start to increase the time between doses. The last dose that should be eliminated is the evening dose.   MAKE SURE YOU:  Understand these instructions.  Get help right away if you are not doing well or get worse.    Thank you for letting us be a part of your medical care team.  It is a privilege we respect  greatly.  We hope these instructions will help you stay on track for a fast and full recovery!    Dental Antibiotics:  In most cases prophylactic antibiotics for Dental procdeures after total joint surgery are not necessary.  Exceptions are as follows:  1. History of prior total joint infection  2. Severely immunocompromised (Organ Transplant, cancer chemotherapy, Rheumatoid biologic meds such as [place])  3. Poorly controlled diabetes (A1C &gt; 8.0, blood glucose over 200)  If you have one of these conditions, contact your surgeon for an antibiotic prescription, prior to your dental procedure.   Increase activity slowly as tolerated   Complete by: As directed    Increase activity slowly as tolerated   Complete by: As directed    Post-operative opioid taper instructions:   Complete by: As directed    POST-OPERATIVE OPIOID TAPER INSTRUCTIONS: It is important to wean off of your opioid medication as soon as possible. If you do not need pain medication after your surgery it is ok to stop day one. Opioids include: Codeine, Hydrocodone(Norco, Vicodin), Oxycodone(Percocet, oxycontin) and hydromorphone amongst others.  Long term and even short term use of opiods can  cause: Increased pain response Dependence Constipation Depression Respiratory depression And more.  Withdrawal symptoms can include Flu like symptoms Nausea, vomiting And more Techniques to manage these symptoms Hydrate well Eat regular healthy meals Stay active Use relaxation techniques(deep breathing, meditating, yoga) Do Not substitute Alcohol to help with tapering If you have been on opioids for less than two weeks and do not have pain than it is ok to stop all together.  Plan to wean off of opioids This plan should start within one week post op of your joint replacement. Maintain the same interval or time between taking each dose and first decrease the dose.  Cut the total daily intake of opioids by one tablet each day Next start to increase the time between doses. The last dose that should be eliminated is the evening dose.      Post-operative opioid taper instructions:   Complete by: As directed    POST-OPERATIVE OPIOID TAPER INSTRUCTIONS: It is important to wean off of your opioid medication as soon as possible. If you do not need pain medication after your surgery it is ok to stop day one. Opioids include: Codeine, Hydrocodone(Norco, Vicodin), Oxycodone(Percocet, oxycontin) and hydromorphone amongst others.  Long term and even short term use of opiods can cause: Increased pain response Dependence Constipation Depression Respiratory depression And more.  Withdrawal symptoms can include Flu like symptoms Nausea, vomiting And more Techniques to manage these symptoms Hydrate well Eat regular healthy meals Stay active Use relaxation techniques(deep breathing, meditating, yoga) Do Not substitute Alcohol to help with tapering If you have been on opioids for less than two weeks and do not have pain than it is ok to stop all together.  Plan to wean off of opioids This plan should start within one week post op of your joint replacement. Maintain the same interval or  time between taking each dose and first decrease the dose.  Cut the total daily intake of opioids by one tablet each day Next start to increase the time between doses. The last dose that should be eliminated is the evening dose.           Follow-up Information     [facility]   Specialty: Home Health Services Why: the office will call to schedule home health physical therapy visits Contact information: [address] [place] [address] [phone]                  Signed: Donella Stade 10/24/2021, 7:22 AM

## 2021-10-30 ENCOUNTER — Ambulatory Visit: Payer: Medicare HMO | Admitting: Physical Therapy

## 2021-10-30 ENCOUNTER — Encounter: Payer: Self-pay | Admitting: Physical Therapy

## 2021-10-30 DIAGNOSIS — R2689 Other abnormalities of gait and mobility: Secondary | ICD-10-CM

## 2021-10-30 DIAGNOSIS — M25562 Pain in left knee: Secondary | ICD-10-CM

## 2021-10-30 DIAGNOSIS — R600 Localized edema: Secondary | ICD-10-CM

## 2021-10-30 DIAGNOSIS — M6281 Muscle weakness (generalized): Secondary | ICD-10-CM

## 2021-10-30 NOTE — Therapy (Signed)
OUTPATIENT PHYSICAL THERAPY LOWER EXTREMITY TREATMENT   Patient Name: Caroline [name] MRN: [id number] DOB:09/29/1950, 71 y.o., female Today's Date: 10/30/2021   PT End of Session - 10/30/21 1150     Visit Number 2    Number of Visits 15    Date for PT Re-Evaluation 12/19/21    Authorization Type [employer] MCR    PT Start Time 1101    PT Stop Time 1142    PT Time Calculation (min) 41 min    Activity Tolerance Patient tolerated treatment well    Behavior During Therapy WFL for tasks assessed/performed              Past Medical History:  Diagnosis Date   Allergy    Zyrtec, Benadryl   Anxiety    Zoloft since several years   Asthma    Balance problem    Cataract    Complication of anesthesia    GERD (gastroesophageal reflux disease)    Hyperlipidemia    Hypertension    Memory changes    Migraines    topmax and Imitrex   Past Surgical History:  Procedure Laterality Date   ABDOMINAL HYSTERECTOMY  2016   uterine prolapse; ovaries INTACT   BLADDER REPAIR     BREAST BIOPSY     NL   BREAST EXCISIONAL BIOPSY     BREAST SURGERY     breast bx x 2 in 1980s; benign   CARPAL TUNNEL RELEASE Bilateral    CATARACT EXTRACTION, BILATERAL  2018   TOTAL KNEE ARTHROPLASTY Left 10/01/2021   Procedure: LEFT TOTAL KNEE ARTHROPLASTY;  Surgeon: Cammy Copa, MD;  Location: [facility] OR;  Service: Orthopedics;  Laterality: Left;   Patient Active Problem List   Diagnosis Date Noted   Arthritis of left knee    Acute encephalopathy/delirium  10/04/2021   S/P total knee arthroplasty, left 10/01/2021   Postcalcaneal bursitis of right foot 03/11/2021   Primary osteoarthritis of both knees 03/11/2021   Chronic pain of both knees 03/11/2021   Asthma due to seasonal allergies 11/17/2019   Osteopenia of multiple sites 10/19/2018   Chronic sinusitis 06/10/2018   Essential hypertension, benign 10/22/2017   Benign hematuria 06/17/2017   Pure hypercholesterolemia 08/11/2016   Anxiety and  depression 08/11/2016   Chronic migraine without aura without status migrainosus, not intractable 08/11/2016    PCP: Aliene Beams, MD  REFERRING PROVIDER: Cammy Copa, MD  REFERRING DIAG: [phone] (ICD-10-CM) - History of total knee arthroplasty, left  THERAPY DIAG:  Acute pain of left knee  Muscle weakness (generalized)  Other abnormalities of gait and mobility  Localized edema  Rationale for Evaluation and Treatment Rehabilitation  ONSET DATE: 10/01/2021  Left TKA  SUBJECTIVE:   SUBJECTIVE STATEMENT:   Knee is doing OK, day is still early.Exercises are coming OK. Pain has been worse this week than last week, I'm not sure why. Sometimes the pain hurts at a diagonal across the knee and in the arch of my left foot, this started maybe Monday.   PERTINENT HISTORY: OA, acure encephalopathy 2023, asthma, postcalcaneal bursitis, osteopenia, anxiety, depression, balance problem, HTN  PAIN:  Are you having pain? Yes: NPRS scale: 4 currently and 8 at worse/10 Pain location: medial knee going at a diagonal across knee  Pain description: achy, dull Aggravating factors: sit too long, then go to get up, wakes up at night with pain Relieving factors: moving, ice, voltaren, NSAIDs  PRECAUTIONS: None  WEIGHT BEARING RESTRICTIONS No  FALLS:  Has patient fallen  in last 6 months? Yes. Number of falls fell one time at hospital before surgery. Does not have a fear of falling but does want to work on balance some  LIVING ENVIRONMENT: Lives with: lives with their spouse Lives in: townhome Stairs: No   OCCUPATION: retired  PLOF: Independent  PATIENT GOALS : be able to walk comfortably and walk normal.    OBJECTIVE:   DIAGNOSTIC FINDINGS: 10/16/2021  AP lateral radiographs left knee reviewed.  Total knee prosthesis in good position alignment with no complicating factors.  PATIENT SURVEYS:  FOTO will need her to do 2nd visit  COGNITION:  Overall cognitive status:  Within functional limits for tasks assessed     SENSATION: Light touch: WFL  EDEMA:  Mild to Moderate edema in Left knee MUSCLE LENGTH: Hamstrings:  Thomas test:    POSTURE:   PALPATION: TTP medial knee  LOWER EXTREMITY ROM:  ROM P:passive  A:active Right eval Left eval  Hip flexion    Hip extension    Hip abduction    Hip adduction    Hip internal rotation    Hip external rotation    Knee flexion A:92 P:99 A:2 P:0  Knee extension    Ankle dorsiflexion    Ankle plantarflexion    Ankle inversion    Ankle eversion     (Blank rows = not tested)  LOWER EXTREMITY MMT:  MMT Right eval Left eval  Hip flexion    Hip extension    Hip abduction    Hip adduction    Hip internal rotation    Hip external rotation    Knee flexion 5 4  Knee extension 5 4  Ankle dorsiflexion    Ankle plantarflexion    Ankle inversion    Ankle eversion     (Blank rows = not tested)  LOWER EXTREMITY SPECIAL TESTS:    FUNCTIONAL TESTS:  5 times sit to stand:  10/24/21: 5 times sit to stand test, no UE support 13.03 seconds  GAIT: Distance walked: 50 feet Assistive device utilized: Single point cane Level of assistance: Modified independence Comments: walking well up to this point post op, does not use AD PLOF    TODAY'S TREATMENT:  10/30/21  FOTO 43.3  Knee flexion overpressure 1x10 5 second holds able to get to 120 degrees  Bridges 1x15  Sidelying hip ABD 1x15 B Prone hip ABD 1x15 B  Forward step ups on 4 inch box L LE 1x15 Lateral step ups on 4 inch box L LE 1x15  Forward lunges on 4 inch box L LE 1x15 Forward step downs from 4 inch box (eccentric, only able to get half way down due to pain) 1x10 min guard for balance Squat holds in front of mat table 1x15 cues for good form  Tandem stance 3x30 seconds B    10/24/21 Reviewed and performed HEP listed below   PATIENT EDUCATION:  Education details: encouraged ice, elevation, activity modification PRN Person  educated: Patient Education method: Explanation, Demonstration, Verbal cues, and Handouts Education comprehension: verbalized understanding, returned demonstration, and needs further education   HOME EXERCISE PROGRAM: Access Code: [email or web address] URL: [email or web address] Date: 10/24/2021 Prepared by: Ivery Quale  Exercises - Supine Heel Slide with Strap  - 3 x daily - 6 x weekly - 1-2 sets - 10 reps - 5-10 sec hold - Heel Prop  - 3 x daily - 6 x weekly - 1 sets - 1 reps - 5-10 min hold - Standing Gastroc Stretch at [employer]  -  3 x daily - 6 x weekly - 1 sets - 3 reps - 30 hold - Standing Hip Abduction  - 3 x daily - 6 x weekly - 1-2 sets - 10 reps - Seated Knee Flexion Stretch  - 3 x daily - 6 x weekly - 1-2 sets - 10 reps - 5 sec hold -also already doing mini squats and heel raises with current HEP    ASSESSMENT:  CLINICAL IMPRESSION:   Marquita arrives today doing OK, flexion ROM is much improved today and we were able to reach 120 degrees with some overpressure applied. Worked on hip strengthening as well as CKC strengthening today. Incision looks good but knee is still a bit swollen, encouraged use of ice and elevation at home as well as activity modification PRN. Also incorporated some balance training per pt request and to help improve safe community access as per LTGs. Will continue to progress as able and tolerated.   OBJECTIVE IMPAIRMENTS: decreased activity tolerance, difficulty walking, decreased balance, decreased endurance, decreased mobility, decreased ROM, decreased strength, impaired flexibility, impaired LE use, postural dysfunction, and pain.  ACTIVITY LIMITATIONS: bending, lifting, carry, locomotion, cleaning, community activity, driving,   PERSONAL FACTORS: OA, acure encephalopathy 2023, asthma, postcalcaneal bursitis, osteopenia, anxiety, depression, balance problem, HTN are also affecting patient's functional outcome.  REHAB POTENTIAL: Excellent  CLINICAL  DECISION MAKING: Stable/uncomplicated  EVALUATION COMPLEXITY: Low    GOALS: Short term PT Goals Target date: 11/27/2021 Pt will be I and compliant with HEP. Baseline:  Goal status: New Pt will decrease pain by 25% overall Baseline: Goal status: New  Long term PT goals Target date: 12/25/2021 Pt will improve Lt knee PROM 0-120 to improve functional mobility Baseline: Goal status: New Pt will improve  left hip/knee strength to at least 5-/5 MMT in sitting to improve functional strength Baseline: Goal status: New Pt will improve FOTO to at least predicted value functional to show improved function Baseline: Goal status: New Pt will reduce pain to overall less than 2-3/10 with usual activity Baseline: Goal status: New Pt will be able to ambulate community distances at least 1000 ft and navigate stairs WNL gait pattern without  AD and without complaints Baseline: Goal status: New  PLAN: PT FREQUENCY: 1-3 times per week   PT DURATION: 6-8 weeks  PLANNED INTERVENTIONS (unless contraindicated): aquatic PT, Canalith repositioning, cryotherapy, Electrical stimulation, Iontophoresis with 4 mg/ml dexamethasome, Moist heat, traction, Ultrasound, gait training, Therapeutic exercise, balance training, neuromuscular re-education, patient/family education, prosthetic training, manual techniques, passive ROM, dry needling, taping, vasopnuematic device, vestibular, spinal manipulations, joint manipulations  PLAN FOR NEXT SESSION: review HEP, nu step or bike, ROM, strength, gait progressions as able. Work on balance too    Lerry Liner PT DPT PN2  10/30/2021, 11:52 AM   Referring diagnosis? Z61.096 (ICD-10-CM) - History of total knee arthroplasty, left Treatment diagnosis? (if different than referring diagnosis) m25.562 What was this (referring dx) caused by? [x]  Surgery []  Fall []  Ongoing issue [x]  Arthritis []  Other: ____________  Laterality: []  Rt [x]  Lt []  Both  Check all  possible CPT codes:  *CHOOSE 10 OR LESS*    [x]  97110 (Therapeutic Exercise)  []  92507 (SLP Treatment)  [x]  97112 (Neuro Re-ed)   []  92526 (Swallowing Treatment)   [x]  97116 (Gait Training)   []  (Cognitive Training, 1st 15 minutes) [x]  97140 (Manual Therapy)   []  97130 (Cognitive Training, each add'l 15 minutes)  [x]  97164 (Re-evaluation)                              []   Other, List CPT Code ____________  [x]  97530 (Therapeutic Activities)     [x]  97535 (Self Care)   []  All codes above (97110 - 97535)  []  97012 (Mechanical Traction)  [x]  97014 (E-stim Unattended)  []  97032 (E-stim manual)  [x]  97033 (Ionto)  [x]  97035 (Ultrasound) []  97750 (Physical Performance Training) []  (Aquatic Therapy) []  97016 (Vasopneumatic Device) []  (Paraffin) []  97034 (Contrast Bath) []  97597 (Wound Care 1st 20 sq cm) []  97598 (Wound Care each add'l 20 sq cm) []  97760 (Orthotic Fabrication, Fitting, Training Initial) []  (Prosthetic Management and Training Initial) []  (Orthotic or Prosthetic Training/ Modification Subsequent)

## 2021-11-01 ENCOUNTER — Other Ambulatory Visit: Payer: Self-pay | Admitting: Surgical

## 2021-11-01 ENCOUNTER — Encounter: Payer: Self-pay | Admitting: Physical Therapy

## 2021-11-01 ENCOUNTER — Ambulatory Visit: Payer: Medicare HMO | Admitting: Physical Therapy

## 2021-11-01 DIAGNOSIS — R600 Localized edema: Secondary | ICD-10-CM

## 2021-11-01 DIAGNOSIS — M25562 Pain in left knee: Secondary | ICD-10-CM

## 2021-11-01 DIAGNOSIS — R2689 Other abnormalities of gait and mobility: Secondary | ICD-10-CM | POA: Diagnosis not present

## 2021-11-01 DIAGNOSIS — M6281 Muscle weakness (generalized): Secondary | ICD-10-CM

## 2021-11-01 NOTE — Therapy (Signed)
OUTPATIENT PHYSICAL THERAPY LOWER EXTREMITY TREATMENT   Patient Name: Caroline [name] MRN: [id number] DOB:[birth year], 71 y.o., female Today's Date: 11/01/2021   PT End of Session - 11/01/21 1140     Visit Number 3    Number of Visits 15    Date for PT Re-Evaluation 12/19/21    Authorization Type [employer] MCR    PT Start Time 1105    PT Stop Time 1135   ended early, sent her down to Dr. Diamantina [place] office to get thier thoughts on need for potential doppler   PT Time Calculation (min) 30 min    Activity Tolerance Patient tolerated treatment well    Behavior During Therapy WFL for tasks assessed/performed               Past Medical History:  Diagnosis Date   Allergy    Zyrtec, Benadryl   Anxiety    Zoloft since several years   Asthma    Balance problem    Cataract    Complication of anesthesia    GERD (gastroesophageal reflux disease)    Hyperlipidemia    Hypertension    Memory changes    Migraines    topmax and Imitrex   Past Surgical History:  Procedure Laterality Date   ABDOMINAL HYSTERECTOMY  2016   uterine prolapse; ovaries INTACT   BLADDER REPAIR     BREAST BIOPSY     NL   BREAST EXCISIONAL BIOPSY     BREAST SURGERY     breast bx x 2 in 1980s; benign   CARPAL TUNNEL RELEASE Bilateral    CATARACT EXTRACTION, BILATERAL  2018   TOTAL KNEE ARTHROPLASTY Left 10/01/2021   Procedure: LEFT TOTAL KNEE ARTHROPLASTY;  Surgeon: Cammy Copa, MD;  Location: [facility] OR;  Service: Orthopedics;  Laterality: Left;   Patient Active Problem List   Diagnosis Date Noted   Arthritis of left knee    Acute encephalopathy/delirium  10/04/2021   S/P total knee arthroplasty, left 10/01/2021   Postcalcaneal bursitis of right foot 03/11/2021   Primary osteoarthritis of both knees 03/11/2021   Chronic pain of both knees 03/11/2021   Asthma due to seasonal allergies 11/17/2019   Osteopenia of multiple sites 10/19/2018   Chronic sinusitis 06/10/2018   Essential hypertension, benign  10/22/2017   Benign hematuria 06/17/2017   Pure hypercholesterolemia 08/11/2016   Anxiety and depression 08/11/2016   Chronic migraine without aura without status migrainosus, not intractable 08/11/2016    PCP: Aliene Beams, MD  REFERRING PROVIDER: Cammy Copa, MD  REFERRING DIAG: [phone] (ICD-10-CM) - History of total knee arthroplasty, left  THERAPY DIAG:  Acute pain of left knee  Muscle weakness (generalized)  Other abnormalities of gait and mobility  Localized edema  Rationale for Evaluation and Treatment Rehabilitation  ONSET DATE: 10/01/2021  Left TKA  SUBJECTIVE:   SUBJECTIVE STATEMENT:   I was sore after PT like expected on Wednesday, that evening I noticed some swelling in my foot and ankle its still a little puffy. I iced it last night and it went down some. It is hurting more too in the calf. Still on baby aspirin after surgery. It feels warm.   PERTINENT HISTORY: OA, acure encephalopathy 2023, asthma, postcalcaneal bursitis, osteopenia, anxiety, depression, balance problem, HTN  PAIN:  Are you having pain? Yes: NPRS scale: 4 currently and 8 at worse/10 Pain location: medial knee going at a diagonal across knee  Pain description: achy, dull Aggravating factors: sit too long, then go to get up,  wakes up at night with pain Relieving factors: moving, ice, voltaren, NSAIDs  PRECAUTIONS: None  WEIGHT BEARING RESTRICTIONS No  FALLS:  Has patient fallen in last 6 months? Yes. Number of falls fell one time at hospital before surgery. Does not have a fear of falling but does want to work on balance some  LIVING ENVIRONMENT: Lives with: lives with their spouse Lives in: townhome Stairs: No   OCCUPATION: retired  PLOF: Independent  PATIENT GOALS : be able to walk comfortably and walk normal.    OBJECTIVE:   DIAGNOSTIC FINDINGS: 10/16/2021  AP lateral radiographs left knee reviewed.  Total knee prosthesis in good position alignment with no  complicating factors.  PATIENT SURVEYS:  FOTO will need her to do 2nd visit  COGNITION:  Overall cognitive status: Within functional limits for tasks assessed     SENSATION: Light touch: WFL  EDEMA:  Mild to Moderate edema in Left knee MUSCLE LENGTH: Hamstrings:  Thomas test:    POSTURE:   PALPATION: TTP medial knee  LOWER EXTREMITY ROM:  ROM P:passive  A:active Right eval Left eval  Hip flexion    Hip extension    Hip abduction    Hip adduction    Hip internal rotation    Hip external rotation    Knee flexion A:92 P:99 A:2 P:0  Knee extension    Ankle dorsiflexion    Ankle plantarflexion    Ankle inversion    Ankle eversion     (Blank rows = not tested)  LOWER EXTREMITY MMT:  MMT Right eval Left eval  Hip flexion    Hip extension    Hip abduction    Hip adduction    Hip internal rotation    Hip external rotation    Knee flexion 5 4  Knee extension 5 4  Ankle dorsiflexion    Ankle plantarflexion    Ankle inversion    Ankle eversion     (Blank rows = not tested)  LOWER EXTREMITY SPECIAL TESTS:    FUNCTIONAL TESTS:  5 times sit to stand:  10/24/21: 5 times sit to stand test, no UE support 13.03 seconds  GAIT: Distance walked: 50 feet Assistive device utilized: Single point cane Level of assistance: Modified independence Comments: walking well up to this point post op, does not use AD PLOF    TODAY'S TREATMENT:  7/28  Patella mobilizations + scar massage, educated OK to put vitamin E oil over intact areas of scar at home  Quad sets 1x15 3 second holds  Isometric hamstrings  STM to quad mm belly with good release noted  Education on precautions/limited session today- increased edema and heat + pain, negative Homan's sign but would be better to consult with MD for potential DVT r/o before returning to aggressive interventions   10/30/21  FOTO 43.3  Knee flexion overpressure 1x10 5 second holds able to get to 120  degrees  Bridges 1x15  Sidelying hip ABD 1x15 B Prone hip ABD 1x15 B  Forward step ups on 4 inch box L LE 1x15 Lateral step ups on 4 inch box L LE 1x15  Forward lunges on 4 inch box L LE 1x15 Forward step downs from 4 inch box (eccentric, only able to get half way down due to pain) 1x10 min guard for balance Squat holds in front of mat table 1x15 cues for good form  Tandem stance 3x30 seconds B    10/24/21 Reviewed and performed HEP listed below   PATIENT EDUCATION:  Education details: encouraged ice, elevation, activity modification PRN Person educated: Patient Education method: Explanation, Demonstration, Verbal cues, and Handouts Education comprehension: verbalized understanding, returned demonstration, and needs further education   HOME EXERCISE PROGRAM: Access Code: [email or web address] URL: [email or web address] Date: 10/24/2021 Prepared by: Ivery Quale  Exercises - Supine Heel Slide with Strap  - 3 x daily - 6 x weekly - 1-2 sets - 10 reps - 5-10 sec hold - Heel Prop  - 3 x daily - 6 x weekly - 1 sets - 1 reps - 5-10 min hold - Standing Gastroc Stretch at [employer]  - 3 x daily - 6 x weekly - 1 sets - 3 reps - 30 hold - Standing Hip Abduction  - 3 x daily - 6 x weekly - 1-2 sets - 10 reps - Seated Knee Flexion Stretch  - 3 x daily - 6 x weekly - 1-2 sets - 10 reps - 5 sec hold -also already doing mini squats and heel raises with current HEP    ASSESSMENT:  CLINICAL IMPRESSION:   Caroline [name] arrives today doing OK, the night after last session she had some increased edema, pain, and heat in her L calf and foot. On exam today she did have some pitting edema and pain in her calf/foot, increased heat, but negative Homan's sign; still on baby aspirin after surgery. Attempted to contact Dr. August Saucer through epic secure chat for guidance but did not receive response during her session. Decided to error on the side of caution and avoided aggressive therapy today, sent her down to see  Dr. Diamantina [place] office for their thoughts on potential doppler. Hopefully we will be able to clarify if MD agrees there is concern for DVT/will be cleared to perform more aggressive interventions next session.   OBJECTIVE IMPAIRMENTS: decreased activity tolerance, difficulty walking, decreased balance, decreased endurance, decreased mobility, decreased ROM, decreased strength, impaired flexibility, impaired LE use, postural dysfunction, and pain.  ACTIVITY LIMITATIONS: bending, lifting, carry, locomotion, cleaning, community activity, driving,   PERSONAL FACTORS: OA, acure encephalopathy 2023, asthma, postcalcaneal bursitis, osteopenia, anxiety, depression, balance problem, HTN are also affecting patient's functional outcome.  REHAB POTENTIAL: Excellent  CLINICAL DECISION MAKING: Stable/uncomplicated  EVALUATION COMPLEXITY: Low    GOALS: Short term PT Goals Target date: 11/29/2021 Pt will be I and compliant with HEP. Baseline:  Goal status: New Pt will decrease pain by 25% overall Baseline: Goal status: New  Long term PT goals Target date: 12/27/2021 Pt will improve Lt knee PROM 0-120 to improve functional mobility Baseline: Goal status: New Pt will improve  left hip/knee strength to at least 5-/5 MMT in sitting to improve functional strength Baseline: Goal status: New Pt will improve FOTO to at least predicted value functional to show improved function Baseline: Goal status: New Pt will reduce pain to overall less than 2-3/10 with usual activity Baseline: Goal status: New Pt will be able to ambulate community distances at least 1000 ft and navigate stairs WNL gait pattern without  AD and without complaints Baseline: Goal status: New  PLAN: PT FREQUENCY: 1-3 times per week   PT DURATION: 6-8 weeks  PLANNED INTERVENTIONS (unless contraindicated): aquatic PT, Canalith repositioning, cryotherapy, Electrical stimulation, Iontophoresis with 4 mg/ml dexamethasome, Moist heat,  traction, Ultrasound, gait training, Therapeutic exercise, balance training, neuromuscular re-education, patient/family education, prosthetic training, manual techniques, passive ROM, dry needling, taping, vasopnuematic device, vestibular, spinal manipulations, joint manipulations  PLAN FOR NEXT SESSION: review HEP, nu step or bike, ROM, strength, gait progressions as able. Work on  balance too. STM to quad    Destine Zirkle U PT DPT PN2  11/01/2021, 11:42 AM   Referring diagnosis? H88.502 (ICD-10-CM) - History of total knee arthroplasty, left Treatment diagnosis? (if different than referring diagnosis) m25.562 What was this (referring dx) caused by? [x]  Surgery []  Fall []  Ongoing issue [x]  Arthritis []  Other: ____________  Laterality: []  Rt [x]  Lt []  Both  Check all possible CPT codes:  *CHOOSE 10 OR LESS*    [x]  97110 (Therapeutic Exercise)  []  92507 (SLP Treatment)  [x]  97112 (Neuro Re-ed)   []  92526 (Swallowing Treatment)   [x]  (Gait Training)   []  (Cognitive Training, 1st 15 minutes) [x]  97140 (Manual Therapy)   []  97130 (Cognitive Training, each add'l 15 minutes)  [x]  97164 (Re-evaluation)                              []  Other, List CPT Code ____________  [x]  97530 (Therapeutic Activities)     [x]  97535 (Self Care)   []  All codes above (97110 - 97535)  []  97012 (Mechanical Traction)  [x]  97014 (E-stim Unattended)  []  97032 (E-stim manual)  [x]  97033 (Ionto)  [x]  97035 (Ultrasound) []  97750 (Physical Performance Training) []  (Aquatic Therapy) []  97016 (Vasopneumatic Device) []  (Paraffin) []  97034 (Contrast Bath) []  97597 (Wound Care 1st 20 sq cm) []  97598 (Wound Care each add'l 20 sq cm) []  97760 (Orthotic Fabrication, Fitting, Training Initial) []  (Prosthetic Management and Training Initial) []  (Orthotic or Prosthetic Training/ Modification Subsequent)

## 2021-11-05 ENCOUNTER — Ambulatory Visit: Payer: Medicare HMO | Admitting: Physical Therapy

## 2021-11-05 ENCOUNTER — Encounter: Payer: Self-pay | Admitting: Physical Therapy

## 2021-11-05 DIAGNOSIS — R2689 Other abnormalities of gait and mobility: Secondary | ICD-10-CM

## 2021-11-05 DIAGNOSIS — R600 Localized edema: Secondary | ICD-10-CM | POA: Diagnosis not present

## 2021-11-05 DIAGNOSIS — M25562 Pain in left knee: Secondary | ICD-10-CM

## 2021-11-05 DIAGNOSIS — M6281 Muscle weakness (generalized): Secondary | ICD-10-CM | POA: Diagnosis not present

## 2021-11-05 NOTE — Therapy (Signed)
OUTPATIENT PHYSICAL THERAPY LOWER EXTREMITY TREATMENT   Patient Name: Caroline [name] MRN: [id number] DOB:1950-08-27, 71 y.o., female Today's Date: 11/05/2021   PT End of Session - 11/05/21 1117     Visit Number 4    Number of Visits 15    Date for PT Re-Evaluation 12/19/21    Authorization Type [employer] MCR    Authorization Time Period until 12/19/21    Authorization - Visit Number 4    Authorization - Number of Visits 12    PT Start Time 1102    PT Stop Time 1145    PT Time Calculation (min) 43 min    Activity Tolerance Patient tolerated treatment well    Behavior During Therapy WFL for tasks assessed/performed               Past Medical History:  Diagnosis Date   Allergy    Zyrtec, Benadryl   Anxiety    Zoloft since several years   Asthma    Balance problem    Cataract    Complication of anesthesia    GERD (gastroesophageal reflux disease)    Hyperlipidemia    Hypertension    Memory changes    Migraines    topmax and Imitrex   Past Surgical History:  Procedure Laterality Date   ABDOMINAL HYSTERECTOMY  2016   uterine prolapse; ovaries INTACT   BLADDER REPAIR     BREAST BIOPSY     NL   BREAST EXCISIONAL BIOPSY     BREAST SURGERY     breast bx x 2 in 1980s; benign   CARPAL TUNNEL RELEASE Bilateral    CATARACT EXTRACTION, BILATERAL  2018   TOTAL KNEE ARTHROPLASTY Left 10/01/2021   Procedure: LEFT TOTAL KNEE ARTHROPLASTY;  Surgeon: Meredith Pel, MD;  Location: [place];  Service: Orthopedics;  Laterality: Left;   Patient Active Problem List   Diagnosis Date Noted   Arthritis of left knee    Acute encephalopathy/delirium  10/04/2021   S/P total knee arthroplasty, left 10/01/2021   Postcalcaneal bursitis of right foot 03/11/2021   Primary osteoarthritis of both knees 03/11/2021   Chronic pain of both knees 03/11/2021   Asthma due to seasonal allergies 11/17/2019   Osteopenia of multiple sites 10/19/2018   Chronic sinusitis 06/10/2018   Essential  hypertension, benign 10/22/2017   Benign hematuria 06/17/2017   Pure hypercholesterolemia 08/11/2016   Anxiety and depression 08/11/2016   Chronic migraine without aura without status migrainosus, not intractable 08/11/2016    PCP: Caren Macadam, MD  REFERRING PROVIDER: Meredith Pel, MD  REFERRING DIAG: [phone] (ICD-10-CM) - History of total knee arthroplasty, left  THERAPY DIAG:  Acute pain of left knee  Muscle weakness (generalized)  Other abnormalities of gait and mobility  Localized edema  Rationale for Evaluation and Treatment Rehabilitation  ONSET DATE: 10/01/2021  Left TKA  SUBJECTIVE:   SUBJECTIVE STATEMENT: she says the pain is better, not having redness or heat in her calf like she was. She says MD staff never got back to her about getting doppler.     PERTINENT HISTORY: OA, acure encephalopathy 2023, asthma, postcalcaneal bursitis, osteopenia, anxiety, depression, balance problem, HTN  PAIN:  Are you having pain? Yes: NPRS scale: 4 currently /10 Pain location: medial knee going at a diagonal across knee  Pain description: achy, dull Aggravating factors: sit too long, then go to get up, wakes up at night with pain Relieving factors: moving, ice, voltaren, NSAIDs  PRECAUTIONS: None  WEIGHT BEARING RESTRICTIONS No  FALLS:  Has patient fallen in last 6 months? Yes. Number of falls fell one time at hospital before surgery. Does not have a fear of falling but does want to work on balance some  LIVING ENVIRONMENT: Lives with: lives with their spouse Lives in: townhome Stairs: No   OCCUPATION: retired  PLOF: Independent  PATIENT GOALS : be able to walk comfortably and walk normal.    OBJECTIVE:   DIAGNOSTIC FINDINGS: 10/16/2021  AP lateral radiographs left knee reviewed.  Total knee prosthesis in good position alignment with no complicating factors.  PATIENT SURVEYS:  FOTO will need her to do 2nd visit  COGNITION:  Overall cognitive  status: Within functional limits for tasks assessed     SENSATION: Light touch: WFL  EDEMA:  Mild to Moderate edema in Left knee MUSCLE LENGTH: Hamstrings:  Thomas test:    POSTURE:   PALPATION: TTP medial knee  LOWER EXTREMITY ROM:  ROM P:passive  A:active Right eval Left eval Left 11/05/21  Hip flexion     Hip extension     Hip abduction     Hip adduction     Hip internal rotation     Hip external rotation     Knee flexion  A:92 P:99 P:120  Knee extension  A:2 P:0   Ankle dorsiflexion     Ankle plantarflexion     Ankle inversion     Ankle eversion      (Blank rows = not tested)  LOWER EXTREMITY MMT:  MMT Right eval Left eval  Hip flexion    Hip extension    Hip abduction    Hip adduction    Hip internal rotation    Hip external rotation    Knee flexion 5 4  Knee extension 5 4  Ankle dorsiflexion    Ankle plantarflexion    Ankle inversion    Ankle eversion     (Blank rows = not tested)  LOWER EXTREMITY SPECIAL TESTS:    FUNCTIONAL TESTS:  5 times sit to stand:  10/24/21: 5 times sit to stand test, no UE support 13.03 seconds  GAIT: Distance walked: 50 feet Assistive device utilized: Single point cane Level of assistance: Modified independence Comments: walking well up to this point post op, does not use AD PLOF    TODAY'S TREATMENT: 11/05/21 -Nu step L4 X 8 min  -Slantboard stretch 30 sec X 3 -Heel and toe raises X15 -Step ups 4 inch X 10 fwd and lateral leading with left -Leg press DL 50# X15, then SL left leg only 25# X15 -Seated LAQ 2# 3X10 -Seated knee flexion stretch AAROM 5 sec X 15  -Manual therapy for Left knee PROM with overpressure and manual hamstring/gastroc stretching   7/28  Patella mobilizations + scar massage, educated OK to put vitamin E oil over intact areas of scar at home  Quad sets 1x15 3 second holds  Isometric hamstrings  STM to quad mm belly with good release noted  Education on precautions/limited  session today- increased edema and heat + pain, negative Homan's sign but would be better to consult with MD for potential DVT r/o before returning to aggressive interventions   10/30/21  FOTO 43.3  Knee flexion overpressure 1x10 5 second holds able to get to 120 degrees  Bridges 1x15  Sidelying hip ABD 1x15 B Prone hip ABD 1x15 B  Forward step ups on 4 inch box L LE 1x15 Lateral step ups on 4 inch box L LE 1x15  Forward lunges  on 4 inch box L LE 1x15 Forward step downs from 4 inch box (eccentric, only able to get half way down due to pain) 1x10 min guard for balance Squat holds in front of mat table 1x15 cues for good form  Tandem stance 3x30 seconds B    10/24/21 Reviewed and performed HEP listed below   PATIENT EDUCATION:  Education details: encouraged ice, elevation, activity modification PRN Person educated: Patient Education method: Explanation, Demonstration, Verbal cues, and Handouts Education comprehension: verbalized understanding, returned demonstration, and needs further education   HOME EXERCISE PROGRAM: Access Code: [email or web address] URL: [email or web address] Date: 10/24/2021 Prepared by: Elsie Ra  Exercises - Supine Heel Slide with Strap  - 3 x daily - 6 x weekly - 1-2 sets - 10 reps - 5-10 sec hold - Heel Prop  - 3 x daily - 6 x weekly - 1 sets - 1 reps - 5-10 min hold - Standing Gastroc Stretch at Counter  - 3 x daily - 6 x weekly - 1 sets - 3 reps - 30 hold - Standing Hip Abduction  - 3 x daily - 6 x weekly - 1-2 sets - 10 reps - Seated Knee Flexion Stretch  - 3 x daily - 6 x weekly - 1-2 sets - 10 reps - 5 sec hold -also already doing mini squats and heel raises with current HEP    ASSESSMENT:  CLINICAL IMPRESSION: she is feeling better and not having sings of redness, heat, or intense pain in the calf when squeezed so I feel suspicion for DVT is very low at this point. She will see MD next week anyway and I reminded her of signs and  symptoms of DVT and that if these present to seek medical attention then. She had good tolerance to exercises today and has now met her flexion ROM goal.    OBJECTIVE IMPAIRMENTS: decreased activity tolerance, difficulty walking, decreased balance, decreased endurance, decreased mobility, decreased ROM, decreased strength, impaired flexibility, impaired LE use, postural dysfunction, and pain.  ACTIVITY LIMITATIONS: bending, lifting, carry, locomotion, cleaning, community activity, driving,   PERSONAL FACTORS: OA, acure encephalopathy 2023, asthma, postcalcaneal bursitis, osteopenia, anxiety, depression, balance problem, HTN are also affecting patient's functional outcome.  REHAB POTENTIAL: Excellent  CLINICAL DECISION MAKING: Stable/uncomplicated  EVALUATION COMPLEXITY: Low    GOALS: Short term PT Goals Target date: 12/03/2021 Pt will be I and compliant with HEP. Baseline:  Goal status: New Pt will decrease pain by 25% overall Baseline: Goal status: New  Long term PT goals Target date: 12/31/2021 Pt will improve Lt knee PROM 0-120 to improve functional mobility Baseline: Goal status: New Pt will improve  left hip/knee strength to at least 5-/5 MMT in sitting to improve functional strength Baseline: Goal status: New Pt will improve FOTO to at least predicted value functional to show improved function Baseline: Goal status: New Pt will reduce pain to overall less than 2-3/10 with usual activity Baseline: Goal status: New Pt will be able to ambulate community distances at least 1000 ft and navigate stairs WNL gait pattern without  AD and without complaints Baseline: Goal status: New  PLAN: PT FREQUENCY: 1-3 times per week   PT DURATION: 6-8 weeks  PLANNED INTERVENTIONS (unless contraindicated): aquatic PT, Canalith repositioning, cryotherapy, Electrical stimulation, Iontophoresis with 4 mg/ml dexamethasome, Moist heat, traction, Ultrasound, gait training, Therapeutic  exercise, balance training, neuromuscular re-education, patient/family education, prosthetic training, manual techniques, passive ROM, dry needling, taping, vasopnuematic device, vestibular, spinal manipulations, joint manipulations  PLAN FOR NEXT  SESSION:  nu step or bike, ROM, strength, gait progressions as able. Work on balance too.   Elsie Ra, PT, DPT 11/05/21 11:19 AM

## 2021-11-08 ENCOUNTER — Encounter: Payer: Self-pay | Admitting: Physical Therapy

## 2021-11-08 ENCOUNTER — Ambulatory Visit: Payer: Medicare HMO | Admitting: Physical Therapy

## 2021-11-08 DIAGNOSIS — M25562 Pain in left knee: Secondary | ICD-10-CM

## 2021-11-08 DIAGNOSIS — M6281 Muscle weakness (generalized): Secondary | ICD-10-CM | POA: Diagnosis not present

## 2021-11-08 DIAGNOSIS — R2689 Other abnormalities of gait and mobility: Secondary | ICD-10-CM | POA: Diagnosis not present

## 2021-11-08 DIAGNOSIS — R600 Localized edema: Secondary | ICD-10-CM | POA: Diagnosis not present

## 2021-11-08 NOTE — Therapy (Signed)
OUTPATIENT PHYSICAL THERAPY LOWER EXTREMITY TREATMENT   Patient Name: Caroline [name] MRN: [id number] DOB:1950/12/25, 71 y.o., female Today's Date: 11/08/2021   PT End of Session - 11/08/21 1105     Visit Number 5    Number of Visits 15    Date for PT Re-Evaluation 12/19/21    Authorization Type [employer] M[employer]    Authorization Time Period until 12/19/21    Authorization - Visit Number 5    Authorization - Number of Visits 12    PT Start Time 1100    PT Stop Time 1138    PT Time Calculation (min) 38 min    Activity Tolerance Patient tolerated treatment well    Behavior During Therapy WFL for tasks assessed/performed               Past Medical History:  Diagnosis Date   Allergy    Zyrtec, Benadryl   Anxiety    Zoloft since several years   Asthma    Balance problem    Cataract    Complication of anesthesia    GERD (gastroesophageal reflux disease)    Hyperlipidemia    Hypertension    Memory changes    Migraines    topmax and Imitrex   Past Surgical History:  Procedure Laterality Date   ABDOMINAL HYSTERECTOMY  2016   uterine prolapse; ovaries INTACT   BLADDER REPAIR     BREAST BIOPSY     NL   BREAST EXCISIONAL BIOPSY     BREAST SURGERY     breast bx x 2 in 1980s; benign   CARPAL TUNNEL RELEASE Bilateral    CATARACT EXTRACTION, BILATERAL  2018   TOTAL KNEE ARTHROPLASTY Left 10/01/2021   Procedure: LEFT TOTAL KNEE ARTHROPLASTY;  Surgeon: Cammy Copa, MD;  Location: [facility];  Service: Orthopedics;  Laterality: Left;   Patient Active Problem List   Diagnosis Date Noted   Arthritis of left knee    Acute encephalopathy/delirium  10/04/2021   S/P total knee arthroplasty, left 10/01/2021   Postcalcaneal bursitis of right foot 03/11/2021   Primary osteoarthritis of both knees 03/11/2021   Chronic pain of both knees 03/11/2021   Asthma due to seasonal allergies 11/17/2019   Osteopenia of multiple sites 10/19/2018   Chronic sinusitis 06/10/2018   Essential  hypertension, benign 10/22/2017   Benign hematuria 06/17/2017   Pure hypercholesterolemia 08/11/2016   Anxiety and depression 08/11/2016   Chronic migraine without aura without status migrainosus, not intractable 08/11/2016    PCP: Aliene Beams, MD  REFERRING PROVIDER: Cammy Copa, MD  REFERRING DIAG: [phone] (ICD-10-CM) - History of total knee arthroplasty, left  THERAPY DIAG:  Acute pain of left knee  Muscle weakness (generalized)  Other abnormalities of gait and mobility  Localized edema  Rationale for Evaluation and Treatment Rehabilitation  ONSET DATE: 10/01/2021  Left TKA  SUBJECTIVE:   SUBJECTIVE STATEMENT: she says the pain is improving     PERTINENT HISTORY: OA, acure encephalopathy 2023, asthma, postcalcaneal bursitis, osteopenia, anxiety, depression, balance problem, HTN  PAIN:  Are you having pain? Yes: NPRS scale: 3 currently /10 Pain location: medial knee going at a diagonal across knee  Pain description: achy, dull Aggravating factors: sit too long, then go to get up, wakes up at night with pain Relieving factors: moving, ice, voltaren, NSAIDs  PRECAUTIONS: None  WEIGHT BEARING RESTRICTIONS No  FALLS:  Has patient fallen in last 6 months? Yes. Number of falls fell one time at hospital before surgery. Does not  have a fear of falling but does want to work on balance some  LIVING ENVIRONMENT: Lives with: lives with their spouse Lives in: townhome Stairs: No   OCCUPATION: retired  PLOF: Independent  PATIENT GOALS : be able to walk comfortably and walk normal.    OBJECTIVE:   DIAGNOSTIC FINDINGS: 10/16/2021  AP lateral radiographs left knee reviewed.  Total knee prosthesis in good position alignment with no complicating factors.  PATIENT SURVEYS:  FOTO will need her to do 2nd visit  COGNITION:  Overall cognitive status: Within functional limits for tasks assessed     SENSATION: Light touch: WFL  EDEMA:  Mild to Moderate  edema in Left knee MUSCLE LENGTH: Hamstrings:  Thomas test:    POSTURE:   PALPATION: TTP medial knee  LOWER EXTREMITY ROM:  ROM P:passive  A:active Right eval Left eval Left 11/05/21  Hip flexion     Hip extension     Hip abduction     Hip adduction     Hip internal rotation     Hip external rotation     Knee flexion  A:92 P:99 P:120  Knee extension  A:2 P:0   Ankle dorsiflexion     Ankle plantarflexion     Ankle inversion     Ankle eversion      (Blank rows = not tested)  LOWER EXTREMITY MMT:  MMT Right eval Left eval  Hip flexion    Hip extension    Hip abduction    Hip adduction    Hip internal rotation    Hip external rotation    Knee flexion 5 4  Knee extension 5 4  Ankle dorsiflexion    Ankle plantarflexion    Ankle inversion    Ankle eversion     (Blank rows = not tested)  LOWER EXTREMITY SPECIAL TESTS:    FUNCTIONAL TESTS:  5 times sit to stand:  10/24/21: 5 times sit to stand test, no UE support 13.03 seconds  GAIT: Distance walked: 50 feet Assistive device utilized: Single point cane Level of assistance: Modified independence Comments: walking well up to this point post op, does not use AD PLOF    TODAY'S TREATMENT: 11/08/21 -Nu step L5 X 8 min  -Slantboard stretch 30 sec X 3 -Heel and toe raises X20 -Step ups 4 inch X 10 fwd and lateral leading with left -Leg press DL 65# H84, then SL left leg only 31# 2X10 -Seated LAQ 4# 2X15 -Sit to stands X15, no UE support, slow eccentrics -March walking, retro walking, tandem walking 3 round trips in bars  -Manual therapy for Left knee PROM with overpressure and manual hamstring/gastroc stretching  11/05/21 -Nu step L4 X 8 min  -Slantboard stretch 30 sec X 3 -Heel and toe raises X15 -Step ups 4 inch X 10 fwd and lateral leading with left -Leg press DL 69# G29, then SL left leg only 25# X15 -Seated LAQ 2# 3X10 -Seated knee flexion stretch AAROM 5 sec X 15  -Manual therapy for Left knee  PROM with overpressure and manual hamstring/gastroc stretching   7/28  Patella mobilizations + scar massage, educated OK to put vitamin E oil over intact areas of scar at home  Quad sets 1x15 3 second holds  Isometric hamstrings  STM to quad mm belly with good release noted  Education on precautions/limited session today- increased edema and heat + pain, negative Homan's sign but would be better to consult with MD for potential DVT r/o before returning to aggressive  interventions   10/30/21  FOTO 43.3  Knee flexion overpressure 1x10 5 second holds able to get to 120 degrees  Bridges 1x15  Sidelying hip ABD 1x15 B Prone hip ABD 1x15 B  Forward step ups on 4 inch box L LE 1x15 Lateral step ups on 4 inch box L LE 1x15  Forward lunges on 4 inch box L LE 1x15 Forward step downs from 4 inch box (eccentric, only able to get half way down due to pain) 1x10 min guard for balance Squat holds in front of mat table 1x15 cues for good form  Tandem stance 3x30 seconds B    10/24/21 Reviewed and performed HEP listed below   PATIENT EDUCATION:  Education details: encouraged ice, elevation, activity modification PRN Person educated: Patient Education method: Explanation, Demonstration, Verbal cues, and Handouts Education comprehension: verbalized understanding, returned demonstration, and needs further education   HOME EXERCISE PROGRAM: Access Code: [email or web address] URL: [email or web address] Date: 10/24/2021 Prepared by: Ivery Quale  Exercises - Supine Heel Slide with Strap  - 3 x daily - 6 x weekly - 1-2 sets - 10 reps - 5-10 sec hold - Heel Prop  - 3 x daily - 6 x weekly - 1 sets - 1 reps - 5-10 min hold - Standing Gastroc Stretch at Counter  - 3 x daily - 6 x weekly - 1 sets - 3 reps - 30 hold - Standing Hip Abduction  - 3 x daily - 6 x weekly - 1-2 sets - 10 reps - Seated Knee Flexion Stretch  - 3 x daily - 6 x weekly - 1-2 sets - 10 reps - 5 sec hold -also already  doing mini squats and heel raises with current HEP    ASSESSMENT:  CLINICAL IMPRESSION: she is overall progressing well with PT, I added more dynamic balance activities to wean her off [facility] for ambulation. I provided her shoe recommendation education per her request today as well. Pt recommending to continue current POC   OBJECTIVE IMPAIRMENTS: decreased activity tolerance, difficulty walking, decreased balance, decreased endurance, decreased mobility, decreased ROM, decreased strength, impaired flexibility, impaired LE use, postural dysfunction, and pain.  ACTIVITY LIMITATIONS: bending, lifting, carry, locomotion, cleaning, community activity, driving,   PERSONAL FACTORS: OA, acure encephalopathy 2023, asthma, postcalcaneal bursitis, osteopenia, anxiety, depression, balance problem, HTN are also affecting patient's functional outcome.  REHAB POTENTIAL: Excellent  CLINICAL DECISION MAKING: Stable/uncomplicated  EVALUATION COMPLEXITY: Low    GOALS: Short term PT Goals Target date: 12/06/2021 Pt will be I and compliant with HEP. Baseline:  Goal status: New Pt will decrease pain by 25% overall Baseline: Goal status: New  Long term PT goals Target date: 01/03/2022 Pt will improve Lt knee PROM 0-120 to improve functional mobility Baseline: Goal status: New Pt will improve  left hip/knee strength to at least 5-/5 MMT in sitting to improve functional strength Baseline: Goal status: New Pt will improve FOTO to at least predicted value functional to show improved function Baseline: Goal status: New Pt will reduce pain to overall less than 2-3/10 with usual activity Baseline: Goal status: New Pt will be able to ambulate community distances at least 1000 ft and navigate stairs WNL gait pattern without  AD and without complaints Baseline: Goal status: New  PLAN: PT FREQUENCY: 1-3 times per week   PT DURATION: 6-8 weeks  PLANNED INTERVENTIONS (unless contraindicated): aquatic PT,  Canalith repositioning, cryotherapy, Electrical stimulation, Iontophoresis with 4 mg/ml dexamethasome, Moist heat, traction, Ultrasound, gait training, Therapeutic exercise, balance training, neuromuscular  re-education, patient/family education, prosthetic training, manual techniques, passive ROM, dry needling, taping, vasopnuematic device, vestibular, spinal manipulations, joint manipulations  PLAN FOR NEXT SESSION:  nu step or bike, ROM, strength, gait progressions as able. Work on balance too.   Ivery Quale, PT, DPT 11/08/21 11:39 AM

## 2021-11-12 ENCOUNTER — Ambulatory Visit: Payer: Medicare HMO | Admitting: Physical Therapy

## 2021-11-12 ENCOUNTER — Encounter: Payer: Self-pay | Admitting: Physical Therapy

## 2021-11-12 DIAGNOSIS — R2689 Other abnormalities of gait and mobility: Secondary | ICD-10-CM

## 2021-11-12 DIAGNOSIS — R600 Localized edema: Secondary | ICD-10-CM | POA: Diagnosis not present

## 2021-11-12 DIAGNOSIS — M6281 Muscle weakness (generalized): Secondary | ICD-10-CM | POA: Diagnosis not present

## 2021-11-12 DIAGNOSIS — M25562 Pain in left knee: Secondary | ICD-10-CM

## 2021-11-12 NOTE — Therapy (Signed)
OUTPATIENT PHYSICAL THERAPY LOWER EXTREMITY TREATMENT   Patient Name: Caroline [name] MRN: [id number] DOB:[birth year], 71 y.o., female Today's Date: 11/12/2021   PT End of Session - 11/12/21 1140     Visit Number 6    Number of Visits 15    Date for PT Re-Evaluation 12/19/21    Authorization Type [employer] MCR    Authorization Time Period until 12/19/21    Authorization - Visit Number 6    Authorization - Number of Visits 12    PT Start Time 1100    PT Stop Time 1140    PT Time Calculation (min) 40 min    Activity Tolerance Patient tolerated treatment well    Behavior During Therapy WFL for tasks assessed/performed                Past Medical History:  Diagnosis Date   Allergy    Zyrtec, Benadryl   Anxiety    Zoloft since several years   Asthma    Balance problem    Cataract    Complication of anesthesia    GERD (gastroesophageal reflux disease)    Hyperlipidemia    Hypertension    Memory changes    Migraines    topmax and Imitrex   Past Surgical History:  Procedure Laterality Date   ABDOMINAL HYSTERECTOMY  2016   uterine prolapse; ovaries INTACT   BLADDER REPAIR     BREAST BIOPSY     NL   BREAST EXCISIONAL BIOPSY     BREAST SURGERY     breast bx x 2 in 1980s; benign   CARPAL TUNNEL RELEASE Bilateral    CATARACT EXTRACTION, BILATERAL  2018   TOTAL KNEE ARTHROPLASTY Left 10/01/2021   Procedure: LEFT TOTAL KNEE ARTHROPLASTY;  Surgeon: Meredith Pel, MD;  Location: [facility];  Service: Orthopedics;  Laterality: Left;   Patient Active Problem List   Diagnosis Date Noted   Arthritis of left knee    Acute encephalopathy/delirium  10/04/2021   S/P total knee arthroplasty, left 10/01/2021   Postcalcaneal bursitis of right foot 03/11/2021   Primary osteoarthritis of both knees 03/11/2021   Chronic pain of both knees 03/11/2021   Asthma due to seasonal allergies 11/17/2019   Osteopenia of multiple sites 10/19/2018   Chronic sinusitis 06/10/2018   Essential  hypertension, benign 10/22/2017   Benign hematuria 06/17/2017   Pure hypercholesterolemia 08/11/2016   Anxiety and depression 08/11/2016   Chronic migraine without aura without status migrainosus, not intractable 08/11/2016    PCP: Caren Macadam, MD  REFERRING PROVIDER: Meredith Pel, MD  REFERRING DIAG: [phone] (ICD-10-CM) - History of total knee arthroplasty, left  THERAPY DIAG:  Acute pain of left knee  Muscle weakness (generalized)  Other abnormalities of gait and mobility  Localized edema  Rationale for Evaluation and Treatment Rehabilitation  ONSET DATE: 10/01/2021  Left TKA  SUBJECTIVE:   SUBJECTIVE STATEMENT: she says the pain is a little more but not bad     PERTINENT HISTORY: OA, acure encephalopathy 2023, asthma, postcalcaneal bursitis, osteopenia, anxiety, depression, balance problem, HTN  PAIN:  Are you having pain? Yes: NPRS scale: 3 currently /10 Pain location: medial knee going at a diagonal across knee  Pain description: achy, dull Aggravating factors: sit too long, then go to get up, wakes up at night with pain Relieving factors: moving, ice, voltaren, NSAIDs  PRECAUTIONS: None  WEIGHT BEARING RESTRICTIONS No  FALLS:  Has patient fallen in last 6 months? Yes. Number of falls fell one time  at hospital before surgery. Does not have a fear of falling but does want to work on balance some  LIVING ENVIRONMENT: Lives with: lives with their spouse Lives in: townhome Stairs: No   OCCUPATION: retired  PLOF: Independent  PATIENT GOALS : be able to walk comfortably and walk normal.    OBJECTIVE:   DIAGNOSTIC FINDINGS: 10/16/2021  AP lateral radiographs left knee reviewed.  Total knee prosthesis in good position alignment with no complicating factors.  PATIENT SURVEYS:  11/12/21 FOTO improved to 65% and met goal for this  COGNITION:  Overall cognitive status: Within functional limits for tasks assessed     SENSATION: Light touch:  WFL  EDEMA:  Mild to Moderate edema in Left knee MUSCLE LENGTH: Hamstrings:  Thomas test:    POSTURE:   PALPATION: TTP medial knee  LOWER EXTREMITY ROM:  ROM P:passive  A:active Right eval Left eval Left 11/05/21 Left 11/12/21  Hip flexion      Hip extension      Hip abduction      Hip adduction      Hip internal rotation      Hip external rotation      Knee flexion  A:92 P:99 P:120 A:120 P:126  Knee extension  A:2 P:0  A:0  Ankle dorsiflexion      Ankle plantarflexion      Ankle inversion      Ankle eversion       (Blank rows = not tested)  LOWER EXTREMITY MMT:  MMT Right eval Left eval Left 11/12/21  Hip flexion   5-  Hip extension     Hip abduction   5-  Hip adduction     Hip internal rotation     Hip external rotation     Knee flexion 5 4 5-  Knee extension $RemoveBefore'5 4 5  '[id number]$ Ankle dorsiflexion     Ankle plantarflexion     Ankle inversion     Ankle eversion      (Blank rows = not tested)  LOWER EXTREMITY SPECIAL TESTS:    FUNCTIONAL TESTS:  5 times sit to stand:  10/24/21: 5 times sit to stand test, no UE support 13.03 seconds  GAIT: eval Distance walked: 50 feet Assistive device utilized: Single point cane Level of assistance: Modified independence Comments: walking well up to this point post op, does not use AD PLOF  11/12/21 Gait now WNL    TODAY'S TREATMENT: 11/12/21 -Nu step L6 X 8 min  -Slantboard stretch 30 sec X 3 -Heel and toe raises X20 -Step ups 6 inch X 10 fwd and lateral leading with left -Leg press DL 75#2 X15, then SL left leg only 31# 2X15 -knee extension machine  5# 2X10 eccentrics (up with both, down with Lt only) -seated hamstring curl machine 25# 2X10 -Sit to stands 2X10, no UE support, slow eccentrics (left leg further back) -March walking, retro walking, tandem walking 3 round trips in bars -Seated SLR on left 2X10 -Seated knee flexion AAROM heelslide stretch 10 sec X10  11/08/21 -Nu step L5 X 8 min  -Slantboard stretch 30  sec X 3 -Heel and toe raises X20 -Step ups 4 inch X 10 fwd and lateral leading with left -Leg press DL 75# X15, then SL left leg only 31# 2X10 -Seated LAQ 4# 2X15 -Sit to stands X15, no UE support, slow eccentrics -March walking, retro walking, tandem walking 3 round trips in bars  -Manual therapy for Left knee PROM with overpressure and manual hamstring/gastroc  stretching  11/05/21 -Nu step L4 X 8 min  -Slantboard stretch 30 sec X 3 -Heel and toe raises X15 -Step ups 4 inch X 10 fwd and lateral leading with left -Leg press DL 50# X15, then SL left leg only 25# X15 -Seated LAQ 2# 3X10 -Seated knee flexion stretch AAROM 5 sec X 15  -Manual therapy for Left knee PROM with overpressure and manual hamstring/gastroc stretching   7/28  Patella mobilizations + scar massage, educated OK to put vitamin E oil over intact areas of scar at home  Quad sets 1x15 3 second holds  Isometric hamstrings  STM to quad mm belly with good release noted  Education on precautions/limited session today- increased edema and heat + pain, negative Homan's sign but would be better to consult with MD for potential DVT r/o before returning to aggressive interventions   10/30/21  FOTO 43.3  Knee flexion overpressure 1x10 5 second holds able to get to 120 degrees  Bridges 1x15  Sidelying hip ABD 1x15 B Prone hip ABD 1x15 B  Forward step ups on 4 inch box L LE 1x15 Lateral step ups on 4 inch box L LE 1x15  Forward lunges on 4 inch box L LE 1x15 Forward step downs from 4 inch box (eccentric, only able to get half way down due to pain) 1x10 min guard for balance Squat holds in front of mat table 1x15 cues for good form  Tandem stance 3x30 seconds B    10/24/21 Reviewed and performed HEP listed below   PATIENT EDUCATION:  Education details: encouraged ice, elevation, activity modification PRN Person educated: Patient Education method: Explanation, Demonstration, Verbal cues, and Handouts Education  comprehension: verbalized understanding, returned demonstration, and needs further education   HOME EXERCISE PROGRAM: Access Code: [email or web address] URL: [email or web address] Date: 10/24/2021 Prepared by: Elsie Ra  Exercises - Supine Heel Slide with Strap  - 3 x daily - 6 x weekly - 1-2 sets - 10 reps - 5-10 sec hold - Heel Prop  - 3 x daily - 6 x weekly - 1 sets - 1 reps - 5-10 min hold - Standing Gastroc Stretch at Counter  - 3 x daily - 6 x weekly - 1 sets - 3 reps - 30 hold - Standing Hip Abduction  - 3 x daily - 6 x weekly - 1-2 sets - 10 reps - Seated Knee Flexion Stretch  - 3 x daily - 6 x weekly - 1-2 sets - 10 reps - 5 sec hold -also already doing mini squats and heel raises with current HEP    ASSESSMENT:  CLINICAL IMPRESSION: She has made excellent progress and has now met her PT goals. I feel she is ready to discharge to independent program and she did not express any concerns about this. She will follow up with MD tomorrow so if he is in agreement for discharge we will plan to finish up next visit.    OBJECTIVE IMPAIRMENTS: decreased activity tolerance, difficulty walking, decreased balance, decreased endurance, decreased mobility, decreased ROM, decreased strength, impaired flexibility, impaired LE use, postural dysfunction, and pain.  ACTIVITY LIMITATIONS: bending, lifting, carry, locomotion, cleaning, community activity, driving,   PERSONAL FACTORS: OA, acure encephalopathy 2023, asthma, postcalcaneal bursitis, osteopenia, anxiety, depression, balance problem, HTN are also affecting patient's functional outcome.  REHAB POTENTIAL: Excellent  CLINICAL DECISION MAKING: Stable/uncomplicated  EVALUATION COMPLEXITY: Low    GOALS: Short term PT Goals Target date: 12/10/2021 Pt will be I and compliant with HEP. Baseline:  Goal status:  MET Pt will decrease pain by 25% overall Baseline: Goal status: MET  Long term PT goals Target date: 01/07/2022 Pt will  improve Lt knee PROM 0-120 to improve functional mobility Baseline: Goal status: MET Pt will improve  left hip/knee strength to at least 5-/5 MMT in sitting to improve functional strength Baseline: Goal status: MET Pt will improve FOTO to at least predicted value functional to show improved function Baseline: Goal status: MET Pt will reduce pain to overall less than 2-3/10 with usual activity Baseline: Goal status: MET Pt will be able to ambulate community distances at least 1000 ft and navigate stairs WNL gait pattern without  AD and without complaints Baseline: Goal status: MET  PLAN: PT FREQUENCY: 1-3 times per week   PT DURATION: 6-8 weeks  PLANNED INTERVENTIONS (unless contraindicated): aquatic PT, Canalith repositioning, cryotherapy, Electrical stimulation, Iontophoresis with 4 mg/ml dexamethasome, Moist heat, traction, Ultrasound, gait training, Therapeutic exercise, balance training, neuromuscular re-education, patient/family education, prosthetic training, manual techniques, passive ROM, dry needling, taping, vasopnuematic device, vestibular, spinal manipulations, joint manipulations  PLAN FOR NEXT SESSION:  DC  Elsie Ra, PT, DPT 11/12/21 11:40 AM

## 2021-11-13 ENCOUNTER — Ambulatory Visit: Payer: Medicare HMO | Admitting: Orthopedic Surgery

## 2021-11-13 DIAGNOSIS — M1712 Unilateral primary osteoarthritis, left knee: Secondary | ICD-10-CM

## 2021-11-14 ENCOUNTER — Encounter: Payer: Medicare HMO | Admitting: Physical Therapy

## 2021-11-15 ENCOUNTER — Encounter: Payer: Self-pay | Admitting: Orthopedic Surgery

## 2021-11-15 NOTE — Progress Notes (Signed)
Post-Op Visit Note   Patient: Caroline [name]           Date of Birth: [birth year]           MRN: [id number] Visit Date: 11/13/2021 PCP: Aliene Beams, MD   Assessment & Plan:  Chief Complaint:  Chief Complaint  Patient presents with   Left Knee - Routine Post Op   Visit Diagnoses: No diagnosis found.  Plan: Caroline [name] is a 71 year old patient underwent left total knee replacement 10/01/2021.  Doing well overall.  On exam she has range of motion 0-100.  Mild swelling is present.  Good stability to varus valgus stress at 0 30 and 90 degrees.  No calf tenderness negative Homans.  Plan at this time is to continue nonweightbearing quad strengthening exercises and continue to walk.  6-week return to recheck swelling.  Has not really done a lot of stairs yet because she does not have any stairs at her house.  She is doing her home exercises and a dedicated fashion by report.  Follow-Up Instructions: Return in about 6 weeks (around 12/25/2021).   Orders:  No orders of the defined types were placed in this encounter.  No orders of the defined types were placed in this encounter.   Imaging: No results found.  PMFS History: Patient Active Problem List   Diagnosis Date Noted   Arthritis of left knee    Acute encephalopathy/delirium  10/04/2021   S/P total knee arthroplasty, left 10/01/2021   Postcalcaneal bursitis of right foot 03/11/2021   Primary osteoarthritis of both knees 03/11/2021   Chronic pain of both knees 03/11/2021   Asthma due to seasonal allergies 11/17/2019   Osteopenia of multiple sites 10/19/2018   Chronic sinusitis 06/10/2018   Essential hypertension, benign 10/22/2017   Benign hematuria 06/17/2017   Pure hypercholesterolemia 08/11/2016   Anxiety and depression 08/11/2016   Chronic migraine without aura without status migrainosus, not intractable 08/11/2016   Past Medical History:  Diagnosis Date   Allergy    Zyrtec, Benadryl   Anxiety    Zoloft since several  years   Asthma    Balance problem    Cataract    Complication of anesthesia    GERD (gastroesophageal reflux disease)    Hyperlipidemia    Hypertension    Memory changes    Migraines    topmax and Imitrex    Family History  Problem Relation Age of Onset   Hypertension Mother    Stroke Mother    Dementia Mother    Cancer Father 37       oral cancer   Heart disease Father        PAD/femoral bypass   Hypertension Father    Hyperlipidemia Father    Alcohol abuse Father    Hyperlipidemia Sister    Hypertension Sister    Breast cancer Maternal Aunt     Past Surgical History:  Procedure Laterality Date   ABDOMINAL HYSTERECTOMY  2016   uterine prolapse; ovaries INTACT   BLADDER REPAIR     BREAST BIOPSY     NL   BREAST EXCISIONAL BIOPSY     BREAST SURGERY     breast bx x 2 in 1980s; benign   CARPAL TUNNEL RELEASE Bilateral    CATARACT EXTRACTION, BILATERAL  2018   TOTAL KNEE ARTHROPLASTY Left 10/01/2021   Procedure: LEFT TOTAL KNEE ARTHROPLASTY;  Surgeon: Cammy Copa, MD;  Location: [facility];  Service: Orthopedics;  Laterality: Left;   Social History  Occupational History   Occupation: employed  Tobacco Use   Smoking status: Former    Types: Cigarettes    Quit date: 04/07/1968    Years since quitting: 53.6   Smokeless tobacco: Never  Vaping Use   Vaping Use: Never used  Substance and Sexual Activity   Alcohol use: Yes    Comment: rarely wine   Drug use: No   Sexual activity: Yes    Birth control/protection: Post-menopausal, Surgical

## 2021-11-19 ENCOUNTER — Telehealth: Payer: Self-pay | Admitting: Orthopedic Surgery

## 2021-11-19 MED ORDER — AMOXICILLIN 500 MG PO TABS: ORAL_TABLET | ORAL | 0 refills | Status: DC

## 2021-11-19 NOTE — Telephone Encounter (Signed)
IC advised submitted but also advised Dr August Saucer recommends waiting until at least 3 months out from surgery for elective dental procedure

## 2021-11-19 NOTE — Telephone Encounter (Signed)
Pt has an upcoming dental appt and need a script for antibiotic. Please send to [facility] on [address]. Pt phone number is [phone]

## 2021-11-27 ENCOUNTER — Encounter: Payer: Self-pay | Admitting: Physical Therapy

## 2021-11-27 ENCOUNTER — Ambulatory Visit: Payer: Medicare HMO | Admitting: Physical Therapy

## 2021-11-27 DIAGNOSIS — R600 Localized edema: Secondary | ICD-10-CM

## 2021-11-27 DIAGNOSIS — M6281 Muscle weakness (generalized): Secondary | ICD-10-CM | POA: Diagnosis not present

## 2021-11-27 DIAGNOSIS — M25562 Pain in left knee: Secondary | ICD-10-CM | POA: Diagnosis not present

## 2021-11-27 DIAGNOSIS — R2689 Other abnormalities of gait and mobility: Secondary | ICD-10-CM | POA: Diagnosis not present

## 2021-11-27 NOTE — Therapy (Signed)
OUTPATIENT PHYSICAL THERAPY LOWER EXTREMITY TREATMENT PHYSICAL THERAPY DISCHARGE SUMMARY  Visits from Start of Care: 7  Current functional level related to goals / functional outcomes: See below   Remaining deficits: See below   Education / Equipment: HEP  Plan:  Patient goals were met. Patient is being discharged due to meeting PT goals.      Patient Name: Caroline [name] MRN: [id number] DOB:Oct 12, 1950, 71 y.o., female Today's Date: 11/27/2021   PT End of Session - 11/27/21 1103     Visit Number 7    Number of Visits 15    Date for PT Re-Evaluation 12/19/21    Authorization Type [employer] MCR    Authorization Time Period until 12/19/21    Authorization - Visit Number 7    Authorization - Number of Visits 12    PT Start Time 3149    PT Stop Time 1138    PT Time Calculation (min) 40 min    Activity Tolerance Patient tolerated treatment well    Behavior During Therapy WFL for tasks assessed/performed                Past Medical History:  Diagnosis Date   Allergy    Zyrtec, Benadryl   Anxiety    Zoloft since several years   Asthma    Balance problem    Cataract    Complication of anesthesia    GERD (gastroesophageal reflux disease)    Hyperlipidemia    Hypertension    Memory changes    Migraines    topmax and Imitrex   Past Surgical History:  Procedure Laterality Date   ABDOMINAL HYSTERECTOMY  2016   uterine prolapse; ovaries INTACT   BLADDER REPAIR     BREAST BIOPSY     NL   BREAST EXCISIONAL BIOPSY     BREAST SURGERY     breast bx x 2 in 1980s; benign   CARPAL TUNNEL RELEASE Bilateral    CATARACT EXTRACTION, BILATERAL  2018   TOTAL KNEE ARTHROPLASTY Left 10/01/2021   Procedure: LEFT TOTAL KNEE ARTHROPLASTY;  Surgeon: Meredith Pel, MD;  Location: [facility];  Service: Orthopedics;  Laterality: Left;   Patient Active Problem List   Diagnosis Date Noted   Arthritis of left knee    Acute encephalopathy/delirium  10/04/2021   S/P total knee  arthroplasty, left 10/01/2021   Postcalcaneal bursitis of right foot 03/11/2021   Primary osteoarthritis of both knees 03/11/2021   Chronic pain of both knees 03/11/2021   Asthma due to seasonal allergies 11/17/2019   Osteopenia of multiple sites 10/19/2018   Chronic sinusitis 06/10/2018   Essential hypertension, benign 10/22/2017   Benign hematuria 06/17/2017   Pure hypercholesterolemia 08/11/2016   Anxiety and depression 08/11/2016   Chronic migraine without aura without status migrainosus, not intractable 08/11/2016    PCP: Caren Macadam, MD  REFERRING PROVIDER: Meredith Pel, MD  REFERRING DIAG: [phone] (ICD-10-CM) - History of total knee arthroplasty, left  THERAPY DIAG:  Acute pain of left knee  Muscle weakness (generalized)  Other abnormalities of gait and mobility  Localized edema  Rationale for Evaluation and Treatment Rehabilitation  ONSET DATE: 10/01/2021  Left TKA  SUBJECTIVE:   SUBJECTIVE STATEMENT: she says the pain depends on what day it is, sometimes good days and sometimes bad days. Her walking is sometimes difficult due to Left knee pain and sometimes its due to Left ankle pain that she describes as tendonitis. Overall she feels ready to transition to independent program.  PERTINENT HISTORY: OA, acure encephalopathy 2023, asthma, postcalcaneal bursitis, osteopenia, anxiety, depression, balance problem, HTN  PAIN:  Are you having pain? Yes: NPRS scale: 5 currently /10 Pain location: medial knee going at a diagonal across knee  Pain description: achy, dull Aggravating factors: sit too long Relieving factors: moving, ice, voltaren, NSAIDs  PRECAUTIONS: None  WEIGHT BEARING RESTRICTIONS No  FALLS:  Has patient fallen in last 6 months? Yes. Number of falls fell one time at hospital before surgery. Does not have a fear of falling but does want to work on balance some  LIVING ENVIRONMENT: Lives with: lives with their spouse Lives in:  townhome Stairs: No   OCCUPATION: retired  PLOF: Independent  PATIENT GOALS : be able to walk comfortably and walk normal.    OBJECTIVE:   DIAGNOSTIC FINDINGS: 10/16/2021  AP lateral radiographs left knee reviewed.  Total knee prosthesis in good position alignment with no complicating factors.  PATIENT SURVEYS:  11/12/21 FOTO improved to 65% and met goal for this  COGNITION:  Overall cognitive status: Within functional limits for tasks assessed     SENSATION: Light touch: WFL  EDEMA:  Mild to Moderate edema in Left knee MUSCLE LENGTH: Hamstrings:  Thomas test:    POSTURE:   PALPATION: TTP medial knee  LOWER EXTREMITY ROM:  ROM P:passive  A:active Right eval Left eval Left 11/05/21 Left 11/12/21  Hip flexion      Hip extension      Hip abduction      Hip adduction      Hip internal rotation      Hip external rotation      Knee flexion  A:92 P:99 P:120 A:120 P:126  Knee extension  A:2 P:0  A:0  Ankle dorsiflexion      Ankle plantarflexion      Ankle inversion      Ankle eversion       (Blank rows = not tested)  LOWER EXTREMITY MMT:  MMT Right eval Left eval Left 11/12/21  Hip flexion   5-  Hip extension     Hip abduction   5-  Hip adduction     Hip internal rotation     Hip external rotation     Knee flexion 5 4 5-  Knee extension $RemoveBefore'5 4 5  '[id number]$ Ankle dorsiflexion     Ankle plantarflexion     Ankle inversion     Ankle eversion      (Blank rows = not tested)  LOWER EXTREMITY SPECIAL TESTS:    FUNCTIONAL TESTS:  5 times sit to stand:  10/24/21: 5 times sit to stand test, no UE support 13.03 seconds  GAIT: eval Distance walked: 50 feet Assistive device utilized: Single point cane Level of assistance: Modified independence Comments: walking well up to this point post op, does not use AD PLOF  11/12/21 Gait now WNL    TODAY'S TREATMENT: 11/27/21 -Nu step L6 X 8 min  -Slantboard stretch 30 sec X 3 -Heel and toe raises X20 -SLS 3 X 30 sec  bilat -Leg press DL 75#3 X10, then SL left leg only 31# 2X15 -Seated hamstring stretch 3X30 sec on left -Sit to stands 2X10, no UE support, slow eccentrics (left leg further back) -tandem walking 3 round trips in bars -Seated SLR on left 2X10 -Seated knee flexion AAROM heelslide stretch 10 sec X10   PATIENT EDUCATION:  Education details: encouraged ice, elevation, activity modification PRN Person educated: Patient Education method: Explanation, Demonstration, Verbal cues, and Handouts  Education comprehension: verbalized understanding, returned demonstration, and needs further education   HOME EXERCISE PROGRAM: Access Code: [email or web address] URL: [email or web address] Date: 11/27/2021 Prepared by: Elsie Ra  Exercises - Seated Knee Flexion Stretch  - 1 x daily - 6 x weekly - 1-2 sets - 10 reps - 5 sec hold - Seated Hamstring Stretch  - 1 x daily - 6 x weekly - 1 sets - 2 reps - 30 hold - Standing Gastroc Stretch at Counter  - 1 x daily - 6 x weekly - 1 sets - 3 reps - 30 hold - Sit to Stand Without Arm Support  - 1 x daily - 6 x weekly - 1-2 sets - 10 reps - Standing Single Leg Stance with Counter Support  - 1 x daily - 6 x weekly - 1 sets - 3 reps - 30 sec hold - Tandem Walking with Counter Support  - 1 x daily - 6 x weekly - 3 sets - Heel Toe Raises with Counter Support  - 1 x daily - 6 x weekly - 2 sets - 10 reps   ASSESSMENT:  CLINICAL IMPRESSION: She has made excellent progress and has now met her PT goals. She had good follow up from MD and she feels ready to discharge today to HEP. I did progress her HEP as well as she shows good understanding. She had no further questions or concerns.  OBJECTIVE IMPAIRMENTS: decreased activity tolerance, difficulty walking, decreased balance, decreased endurance, decreased mobility, decreased ROM, decreased strength, impaired flexibility, impaired LE use, postural dysfunction, and pain.  ACTIVITY LIMITATIONS: bending, lifting,  carry, locomotion, cleaning, community activity, driving,   PERSONAL FACTORS: OA, acure encephalopathy 2023, asthma, postcalcaneal bursitis, osteopenia, anxiety, depression, balance problem, HTN are also affecting patient's functional outcome.  REHAB POTENTIAL: Excellent  CLINICAL DECISION MAKING: Stable/uncomplicated  EVALUATION COMPLEXITY: Low    GOALS: Short term PT Goals Target date: 12/25/2021 Pt will be I and compliant with HEP. Baseline:  Goal status: MET Pt will decrease pain by 25% overall Baseline: Goal status: MET  Long term PT goals Target date: 01/22/2022 Pt will improve Lt knee PROM 0-120 to improve functional mobility Baseline: Goal status: MET Pt will improve  left hip/knee strength to at least 5-/5 MMT in sitting to improve functional strength Baseline: Goal status: MET Pt will improve FOTO to at least predicted value functional to show improved function Baseline: Goal status: MET Pt will reduce pain to overall less than 2-3/10 with usual activity Baseline: Goal status: MET Pt will be able to ambulate community distances at least 1000 ft and navigate stairs WNL gait pattern without  AD and without complaints Baseline: Goal status: MET  PLAN: PT FREQUENCY: 1-3 times per week   PT DURATION: 6-8 weeks  PLANNED INTERVENTIONS (unless contraindicated): aquatic PT, Canalith repositioning, cryotherapy, Electrical stimulation, Iontophoresis with 4 mg/ml dexamethasome, Moist heat, traction, Ultrasound, gait training, Therapeutic exercise, balance training, neuromuscular re-education, patient/family education, prosthetic training, manual techniques, passive ROM, dry needling, taping, vasopnuematic device, vestibular, spinal manipulations, joint manipulations  PLAN FOR NEXT SESSION:  DC  Elsie Ra, PT, DPT 11/27/21 11:30 AM

## 2021-12-25 ENCOUNTER — Ambulatory Visit: Payer: Medicare HMO | Admitting: Orthopedic Surgery

## 2021-12-25 ENCOUNTER — Ambulatory Visit (INDEPENDENT_AMBULATORY_CARE_PROVIDER_SITE_OTHER): Payer: Medicare HMO

## 2021-12-25 DIAGNOSIS — M25571 Pain in right ankle and joints of right foot: Secondary | ICD-10-CM | POA: Diagnosis not present

## 2021-12-26 ENCOUNTER — Encounter: Payer: Self-pay | Admitting: Orthopedic Surgery

## 2021-12-26 NOTE — Progress Notes (Signed)
Office Visit Note   Patient: Caroline [name]           Date of Birth: 1951-02-19           MRN: [id number] Visit Date: 12/25/2021 Requested by: Aliene Beams, MD [address] [place],  [place] [address] PCP: Aliene Beams, MD  Subjective: Chief Complaint  Patient presents with   Left Knee - Routine Post Op    10/01/21 left TKA   Right Ankle - Pain    HPI: Caroline [name] is a 71 year old patient who underwent left total knee replacement about 3 months ago.  Overall the knee is getting better.  Still has pain at times.  She has about half a city block of walking endurance.  She has completed physical therapy.  Patient also reports right ankle pain.  She has had ultrasound examination of the right ankle.  Pain localizes to the Achilles tendon region.  She has tried a heel lift clear as well as topical.  She also has been doing home exercise program for her left knee and right ankle.              ROS: All systems reviewed are negative as they relate to the chief complaint within the history of present illness.  Patient denies  fevers or chills.   Assessment & Plan: Visit Diagnoses:  1. Pain in right ankle and joints of right foot     Plan: Impression is left total knee replacement doing well with range of motion 0-1 25.  Walking endurance should improve.  The right ankle has some tenderness around the Achilles tendon but not too much swelling.  Fairly minimal spurring on plain radiographs.  Symptoms ongoing now for many months for the right ankle if she has had failure of conservative treatment.  Not too much in terms of Haglund deformity present.  Really unclear etiology about the source of this ankle pain.  Could be sural nerve irritation or peroneal tendon tendinitis or Achilles tendinitis.  Plan MRI scan of the right ankle to evaluate the posterolateral pain.  Follow-up after that study.  Follow-Up Instructions: No follow-ups on file.   Orders:  Orders Placed This Encounter  Procedures    XR Ankle Complete Right   MR Ankle Right w/o contrast   No orders of the defined types were placed in this encounter.     Procedures: No procedures performed   Clinical Data: No additional findings.  Objective: Vital Signs: There were no vitals taken for this visit.  Physical Exam:   Constitutional: Patient appears well-developed HEENT:  Head: Normocephalic Eyes:EOM are normal Neck: Normal range of motion Cardiovascular: Normal rate Pulmonary/chest: Effort normal Neurologic: Patient is alert Skin: Skin is warm Psychiatric: Patient has normal mood and affect   Ortho Exam: Ortho exam demonstrates excellent range of motion of the left knee from 0-1 25.  No effusion.  Incision intact.  Slight quad atrophy on the left compared to the right.  On the right ankle the patient has excellent ankle dorsiflexion plantarflexion inversion and eversion strength.  Range of motion also full and symmetric with 25 degrees of plantarflexion and about 50 degrees of ankle dorsiflexion.  Mild tenderness between the lateral malleolus and the Achilles.  No masses lymphadenopathy or skin changes noted in the Achilles tendon region.  The Achilles tendon itself does not appear to be enlarged on the right-hand side compared to the left-hand side with looking from behind.  Specialty Comments:  No specialty comments available.  Imaging: XR Ankle Complete Right  Result Date: 12/26/2021 AP lateral mortise radiographs right ankle reviewed.  No acute fracture.  Mortise is symmetric.  Mild degenerative changes in the subtalar joint.  Tibiotalar joint appears intact.  No os acromiale.    PMFS History: Patient Active Problem List   Diagnosis Date Noted   Arthritis of left knee    Acute encephalopathy/delirium  10/04/2021   S/P total knee arthroplasty, left 10/01/2021   Postcalcaneal bursitis of right foot 03/11/2021   Primary osteoarthritis of both knees 03/11/2021   Chronic pain of both knees 03/11/2021    Asthma due to seasonal allergies 11/17/2019   Osteopenia of multiple sites 10/19/2018   Chronic sinusitis 06/10/2018   Essential hypertension, benign 10/22/2017   Benign hematuria 06/17/2017   Pure hypercholesterolemia 08/11/2016   Anxiety and depression 08/11/2016   Chronic migraine without aura without status migrainosus, not intractable 08/11/2016   Past Medical History:  Diagnosis Date   Allergy    Zyrtec, Benadryl   Anxiety    Zoloft since several years   Asthma    Balance problem    Cataract    Complication of anesthesia    GERD (gastroesophageal reflux disease)    Hyperlipidemia    Hypertension    Memory changes    Migraines    topmax and Imitrex    Family History  Problem Relation Age of Onset   Hypertension Mother    Stroke Mother    Dementia Mother    Cancer Father 42       oral cancer   Heart disease Father        PAD/femoral bypass   Hypertension Father    Hyperlipidemia Father    Alcohol abuse Father    Hyperlipidemia Sister    Hypertension Sister    Breast cancer Maternal Aunt     Past Surgical History:  Procedure Laterality Date   ABDOMINAL HYSTERECTOMY  2016   uterine prolapse; ovaries INTACT   BLADDER REPAIR     BREAST BIOPSY     NL   BREAST EXCISIONAL BIOPSY     BREAST SURGERY     breast bx x 2 in 1980s; benign   CARPAL TUNNEL RELEASE Bilateral    CATARACT EXTRACTION, BILATERAL  2018   TOTAL KNEE ARTHROPLASTY Left 10/01/2021   Procedure: LEFT TOTAL KNEE ARTHROPLASTY;  Surgeon: Meredith Pel, MD;  Location: [facility];  Service: Orthopedics;  Laterality: Left;   Social History   Occupational History   Occupation: employed  Tobacco Use   Smoking status: Former    Types: Cigarettes    Quit date: 04/07/1968    Years since quitting: 53.7   Smokeless tobacco: Never  Vaping Use   Vaping Use: Never used  Substance and Sexual Activity   Alcohol use: Yes    Comment: rarely wine   Drug use: No   Sexual activity: Yes    Birth  control/protection: Post-menopausal, Surgical

## 2022-01-09 ENCOUNTER — Other Ambulatory Visit: Payer: Self-pay

## 2022-01-09 ENCOUNTER — Telehealth: Payer: Self-pay | Admitting: Orthopedic Surgery

## 2022-01-09 MED ORDER — AMOXICILLIN 500 MG PO TABS: ORAL_TABLET | ORAL | 0 refills | Status: DC

## 2022-01-09 NOTE — Telephone Encounter (Signed)
Pt needs premed sent to [facility] on [address].

## 2022-01-09 NOTE — Telephone Encounter (Signed)
Rx for premed submitted to pharmacy

## 2022-01-10 ENCOUNTER — Ambulatory Visit
Admission: RE | Admit: 2022-01-10 | Discharge: 2022-01-10 | Disposition: A | Discharge status: Home / Self Care | Payer: Medicare HMO | Source: Ambulatory Visit | Attending: Orthopedic Surgery | Admitting: Orthopedic Surgery

## 2022-01-10 DIAGNOSIS — M25571 Pain in right ankle and joints of right foot: Secondary | ICD-10-CM

## 2022-01-20 ENCOUNTER — Ambulatory Visit: Payer: Medicare HMO | Admitting: Orthopedic Surgery

## 2022-01-20 ENCOUNTER — Telehealth: Payer: Self-pay

## 2022-01-20 NOTE — Telephone Encounter (Signed)
Patient canceled he appointment due to being exposed to Covid and would like call from Dr. Marlou Sa about her MRI results for her right ankle.  Cb# [phone].  Please advise.

## 2022-01-22 NOTE — Telephone Encounter (Signed)
I called her and we talked about eccentric stretching for the Achilles.  A lot of bone edema at the Achilles and insertion site.  Moderate tendinosis and partial-thickness tearing but I will think that is a huge component of her pain generators I think it is most likely the bone bruising.  She will let us know if the treatments do not work.  Also recommended some topical.

## 2022-01-29 ENCOUNTER — Ambulatory Visit: Payer: Medicare HMO | Admitting: Orthopedic Surgery

## 2022-02-13 ENCOUNTER — Ambulatory Visit: Payer: Medicare HMO | Admitting: Orthopedic Surgery

## 2022-02-13 ENCOUNTER — Encounter: Payer: Self-pay | Admitting: Orthopedic Surgery

## 2022-02-13 DIAGNOSIS — M25571 Pain in right ankle and joints of right foot: Secondary | ICD-10-CM | POA: Diagnosis not present

## 2022-02-13 NOTE — Progress Notes (Signed)
Office Visit Note   Patient: Caroline [name]           Date of Birth: 04/01/1951           MRN: [id number] Visit Date: 02/13/2022 Requested by: Aliene Beams, MD [address] [place],  [place] [address] PCP: Aliene Beams, MD  Subjective: Chief Complaint  Patient presents with   Other    Scan review    HPI: Caroline [name] is a 71 y.o. female who presents to the office reporting left ankle pain.  Since she was last seen she had an MRI scan of the left ankle.  That shows some partial-thickness tearing and tendinosis at the tendon attachment site.  There is also some bony edema in the calcaneal region.  No acute fracture.  Symptoms persist.  She really cannot wear shoes that have heels that wraparound the back of her leg..                ROS: All systems reviewed are negative as they relate to the chief complaint within the history of present illness.  Patient denies fevers or chills.  Assessment & Plan: Visit Diagnoses:  1. Pain in right ankle and joints of right foot     Plan: Impression is posterior calcaneal pain with possible early stress reaction in the calcaneus.  Recommended vitamin D supplementation 5000 units a day for 6 weeks.  Last vitamin D level 3 years ago 35.  Likely has decreased since then.  Eccentric stretching and strengthening with physical therapy with modalities as well can also be done.  We will set that up with physical therapy.  Follow-up in 6 weeks if no improvement.  Injection not indicated.  Could consider extracorporeal shockwave treatment with Dr. Shon Baton if no improvement noted.  Follow-Up Instructions: No follow-ups on file.   Orders:  Orders Placed This Encounter  Procedures   Ambulatory referral to Physical Therapy   No orders of the defined types were placed in this encounter.     Procedures: No procedures performed   Clinical Data: No additional findings.  Objective: Vital Signs: There were no vitals taken for this visit.  Physical  Exam:  Constitutional: Patient appears well-developed HEENT:  Head: Normocephalic Eyes:EOM are normal Neck: Normal range of motion Cardiovascular: Normal rate Pulmonary/chest: Effort normal Neurologic: Patient is alert Skin: Skin is warm Psychiatric: Patient has normal mood and affect  Ortho Exam: Ortho exam demonstrates slightly antalgic gait to the left.  Pedal pulses palpable.  Tenderness is present at the calcaneal attachment site.  Squeeze test equivocal on the left calcaneus compared to the right.  Tibiotalar subtalar transverse tarsal range of motion intact.  Heel cord not excessively tight.  Plantarflexion strength 5+ out of 5 bilateral  Specialty Comments:  No specialty comments available.  Imaging: No results found.   PMFS History: Patient Active Problem List   Diagnosis Date Noted   Arthritis of left knee    Acute encephalopathy/delirium  10/04/2021   S/P total knee arthroplasty, left 10/01/2021   Postcalcaneal bursitis of right foot 03/11/2021   Primary osteoarthritis of both knees 03/11/2021   Chronic pain of both knees 03/11/2021   Asthma due to seasonal allergies 11/17/2019   Osteopenia of multiple sites 10/19/2018   Chronic sinusitis 06/10/2018   Essential hypertension, benign 10/22/2017   Benign hematuria 06/17/2017   Pure hypercholesterolemia 08/11/2016   Anxiety and depression 08/11/2016   Chronic migraine without aura without status migrainosus, not intractable 08/11/2016   Past Medical  History:  Diagnosis Date   Allergy    Zyrtec, Benadryl   Anxiety    Zoloft since several years   Asthma    Balance problem    Cataract    Complication of anesthesia    GERD (gastroesophageal reflux disease)    Hyperlipidemia    Hypertension    Memory changes    Migraines    topmax and Imitrex    Family History  Problem Relation Age of Onset   Hypertension Mother    Stroke Mother    Dementia Mother    Cancer Father 75       oral cancer   Heart disease  Father        PAD/femoral bypass   Hypertension Father    Hyperlipidemia Father    Alcohol abuse Father    Hyperlipidemia Sister    Hypertension Sister    Breast cancer Maternal Aunt     Past Surgical History:  Procedure Laterality Date   ABDOMINAL HYSTERECTOMY  2016   uterine prolapse; ovaries INTACT   BLADDER REPAIR     BREAST BIOPSY     NL   BREAST EXCISIONAL BIOPSY     BREAST SURGERY     breast bx x 2 in 1980s; benign   CARPAL TUNNEL RELEASE Bilateral    CATARACT EXTRACTION, BILATERAL  2018   TOTAL KNEE ARTHROPLASTY Left 10/01/2021   Procedure: LEFT TOTAL KNEE ARTHROPLASTY;  Surgeon: Cammy Copa, MD;  Location: [facility];  Service: Orthopedics;  Laterality: Left;   Social History   Occupational History   Occupation: employed  Tobacco Use   Smoking status: Former    Types: Cigarettes    Quit date: 04/07/1968    Years since quitting: 53.8   Smokeless tobacco: Never  Vaping Use   Vaping Use: Never used  Substance and Sexual Activity   Alcohol use: Yes    Comment: rarely wine   Drug use: No   Sexual activity: Yes    Birth control/protection: Post-menopausal, Surgical

## 2022-02-17 ENCOUNTER — Telehealth: Payer: Medicare HMO | Admitting: Diagnostic Neuroimaging

## 2022-02-17 ENCOUNTER — Encounter: Payer: Self-pay | Admitting: Diagnostic Neuroimaging

## 2022-02-17 DIAGNOSIS — G43109 Migraine with aura, not intractable, without status migrainosus: Secondary | ICD-10-CM | POA: Diagnosis not present

## 2022-02-17 MED ORDER — SUMATRIPTAN SUCCINATE 100 MG PO TABS: 100.0000 mg | ORAL_TABLET | Freq: Once | ORAL | 6 refills | Status: DC | PRN

## 2022-02-17 NOTE — Progress Notes (Signed)
[facility]  PATIENT: Caroline [name] DOB: [birth year]  REFERRING CLINICIAN: Aliene Beams, MD HISTORY FROM: patient REASON FOR VISIT: follow up   HISTORICAL  CHIEF COMPLAINT:  Chief Complaint  Patient presents with   Migraine         HISTORY OF PRESENT ILLNESS:   UPDATE (02/17/22, VRP): Since last visit, doing well. 3-4 HA per month. Improved on their own. HA are stress triggered. Memory loss is better.   PRIOR HPI (08/12/21): 71 year old female here for evaluation of migraines, gait and balance difficulty, memory loss.  Regarding migraines patient has had headaches with right-sided pain, nausea, seeing spots, sensitive light and sound, sensitive to smells, since teenage years.  Triggering factors include stress and tension, red wine and tequila.  Sometimes skipping meals also will trigger headaches.  Patient has tried sumatriptan in the past which seems to help.  She is averaging 4 to 12 days of migraine per month.  Other medicines tried include Excedrin, tramadol, Nurtec, Toprol, sertraline, Wellbutrin and topiramate and zonisamide.  Also having some issues with short-term memory problems and decreased attention.  No major change in ADLs.  She is under severe stress related to husband's health and other factors.  Also with chronic anxiety and depression issues on medications and counseling treatments.  Also with some intermittent gait stability since 2015, when she started to have knee and ankle problems.  Left knee and right ankle are affected.  She has had some falls.  She has been less active than before.    REVIEW OF SYSTEMS: Full 14 system review of systems performed and negative with exception of: as per HPI.  ALLERGIES: Allergies  Allergen Reactions   Anesthesia S-I-40 [Propofol] Other (See Comments)    Causing a migraine and irritability   Oxycontin [Oxycodone Hcl]     Causes migraines   Tetracyclines & Related Nausea And Vomiting   Vicodin  [Hydrocodone-Acetaminophen] Nausea And Vomiting    Dizzy, causes migraines    HOME MEDICATIONS: Outpatient Medications Prior to Visit  Medication Sig Dispense Refill   acetaminophen (TYLENOL) 325 MG tablet Take 1-2 tablets (325-650 mg total) by mouth every 6 (six) hours as needed for mild pain (pain score 1-3 or temp > 100.5). 60 tablet 0   albuterol (VENTOLIN HFA) 108 (90 Base) MCG/ACT inhaler Inhale 2 puffs into the lungs every 6 (six) hours as needed for wheezing or shortness of breath. 8 g 3   amoxicillin (AMOXIL) 500 MG tablet Take 2g 1 hour prior to dental procedure 10 tablet 0   aspirin 81 MG chewable tablet Chew 1 tablet (81 mg total) by mouth 2 (two) times daily. 60 tablet 0   celecoxib (CELEBREX) 100 MG capsule Take 1 capsule by mouth twice daily 60 capsule 0   cetirizine (ZYRTEC) 10 MG tablet Take 10 mg by mouth daily.     diclofenac Sodium (VOLTAREN) 1 % GEL Apply 2 g topically daily as needed (achillles pain).     famotidine (PEPCID) 20 MG tablet Take 1 tablet (20 mg total) by mouth 2 (two) times daily. (Patient not taking: Reported on 09/20/2021) 60 tablet 3   fluticasone (FLONASE) 50 MCG/ACT nasal spray Place 1 spray into both nostrils 2 (two) times daily. (Patient taking differently: Place 1 spray into both nostrils daily as needed for allergies.) 16 g 6   hydrOXYzine (VISTARIL) 50 MG capsule Take 50 mg by mouth at bedtime.     metoprolol succinate (TOPROL-XL) 100 MG 24 hr tablet TAKE 1 TABLET  EVERY DAY. TAKE WITH OR IMMEDIATELY FOLLOWING A MEAL. 90 tablet 3   ondansetron (ZOFRAN) 8 MG tablet Take 1 tablet (8 mg total) by mouth every 8 (eight) hours as needed for nausea or vomiting. 30 tablet 5   rosuvastatin (CRESTOR) 10 MG tablet Take 10 mg by mouth at bedtime.     sertraline (ZOLOFT) 100 MG tablet TAKE 1 AND 1/2 TABLETS EVERY DAY (Patient taking differently: Take 200 mg by mouth daily. TAKE 1 AND 1/2 TABLETS EVERY DAY) 135 tablet 3   SYMBICORT 80-4.5 MCG/ACT inhaler Inhale 2  puffs into the lungs 2 (two) times daily as needed (shortness of breath).     vitamin B-12 (CYANOCOBALAMIN) 500 MCG tablet Take 1,000 mcg by mouth daily.     No facility-administered medications prior to visit.    PAST MEDICAL HISTORY: Past Medical History:  Diagnosis Date   Allergy    Zyrtec, Benadryl   Anxiety    Zoloft since several years   Asthma    Balance problem    Cataract    Complication of anesthesia    GERD (gastroesophageal reflux disease)    Hyperlipidemia    Hypertension    Memory changes    Migraines    topmax and Imitrex    PAST SURGICAL HISTORY: Past Surgical History:  Procedure Laterality Date   ABDOMINAL HYSTERECTOMY  2016   uterine prolapse; ovaries INTACT   BLADDER REPAIR     BREAST BIOPSY     NL   BREAST EXCISIONAL BIOPSY     BREAST SURGERY     breast bx x 2 in 1980s; benign   CARPAL TUNNEL RELEASE Bilateral    CATARACT EXTRACTION, BILATERAL  2018   TOTAL KNEE ARTHROPLASTY Left 10/01/2021   Procedure: LEFT TOTAL KNEE ARTHROPLASTY;  Surgeon: Cammy Copa, MD;  Location: [facility];  Service: Orthopedics;  Laterality: Left;    FAMILY HISTORY: Family History  Problem Relation Age of Onset   Hypertension Mother    Stroke Mother    Dementia Mother    Cancer Father 11       oral cancer   Heart disease Father        PAD/femoral bypass   Hypertension Father    Hyperlipidemia Father    Alcohol abuse Father    Hyperlipidemia Sister    Hypertension Sister    Breast cancer Maternal Aunt     SOCIAL HISTORY: Social History   Socioeconomic History   Marital status: Married    Spouse name: Casimiro Needle   Number of children: 2   Years of education: Not on file   Highest education level: 12th grade  Occupational History   Occupation: employed  Tobacco Use   Smoking status: Former    Types: Cigarettes    Quit date: 04/07/1968    Years since quitting: 53.9   Smokeless tobacco: Never  Vaping Use   Vaping Use: Never used  Substance and Sexual  Activity   Alcohol use: Yes    Comment: rarely wine   Drug use: No   Sexual activity: Yes    Birth control/protection: Post-menopausal, Surgical  Other Topics Concern   Not on file  Social History Narrative   Marital status: married x 45 years; from [place]      Children: 2 children (one in [place]; one in [place] ); 2 grandchildren      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  Alcohol: rare      Exercise: none      Patient is right-handed. She lives with her husband in one level home. She rarely drinks caffeine. She is active at work.   Social Determinants of Health   Financial Resource Strain: Not on file  Food Insecurity: Not on file  Transportation Needs: Not on file  Physical Activity: Not on file  Stress: Not on file  Social Connections: Not on file  Intimate Partner Violence: Not on file     PHYSICAL EXAM  GENERAL EXAM/CONSTITUTIONAL: Vitals:  There were no vitals filed for this visit.  There is no height or weight on file to calculate BMI. Wt Readings from Last 3 Encounters:  10/01/21 140 lb 14.4 oz (63.9 kg)  09/24/21 140 lb 14.4 oz (63.9 kg)  08/12/21 146 lb (66.2 kg)   Patient is in no distress; well developed, nourished and groomed; neck is supple  CARDIOVASCULAR: Examination of carotid arteries is normal; no carotid bruits Regular rate and rhythm, no murmurs Examination of peripheral vascular system by observation and palpation is normal  EYES: Ophthalmoscopic exam of optic discs and posterior segments is normal; no papilledema or hemorrhages No results found.  MUSCULOSKELETAL: Gait, strength, tone, movements noted in Neurologic exam below  NEUROLOGIC: MENTAL STATUS:     08/12/2021   10:23 AM  MMSE - Mini Mental State Exam  Orientation to time 5  Orientation to Place 4  Registration 3  Attention/ Calculation 4  Recall 3  Language- name 2 objects 2  Language-  repeat 1  Language- follow 3 step command 3  Language- read & follow direction 1  Write a sentence 1  Copy design 1  Total score 28   awake, alert, oriented to person, place and time recent and remote memory intact normal attention and concentration language fluent, comprehension intact, naming intact fund of knowledge appropriate  CRANIAL NERVE:  2nd - no papilledema on fundoscopic exam 2nd, 3rd, 4th, 6th - pupils equal and reactive to light, visual fields full to confrontation, extraocular muscles intact, no nystagmus 5th - facial sensation symmetric 7th - facial strength symmetric 8th - hearing intact 9th - palate elevates symmetrically, uvula midline 11th - shoulder shrug symmetric 12th - tongue protrusion midline  MOTOR:  normal bulk and tone, full strength in the BUE, BLE  SENSORY:  normal and symmetric to light touch, temperature, vibration  COORDINATION:  finger-nose-finger, fine finger movements normal  REFLEXES:  deep tendon reflexes TRACE and symmetric  GAIT/STATION:  narrow based gait; SLIGHTLY CAUTIOUS AND SLOW     DIAGNOSTIC DATA (LABS, IMAGING, TESTING) - I reviewed patient records, labs, notes, testing and imaging myself where available.  Lab Results  Component Value Date   WBC 6.5 10/06/2021   HGB 8.1 (L) 10/06/2021   HCT 24.8 (L) 10/06/2021   MCV 92.5 10/06/2021   PLT 222 10/06/2021      Component Value Date/Time   NA 140 10/06/2021 0110   NA 141 11/17/2019 1212   K 4.3 10/06/2021 0110   CL 109 10/06/2021 0110   CO2 22 10/06/2021 0110   GLUCOSE 127 (H) 10/06/2021 0110   BUN 10 10/06/2021 0110   BUN 17 11/17/2019 1212   CREATININE 0.67 10/06/2021 0110   CALCIUM 9.2 10/06/2021 0110   PROT 6.2 (L) 10/04/2021 1120   PROT 7.1 11/17/2019 1212   ALBUMIN 3.3 (L) 10/04/2021 1120   ALBUMIN 4.7 11/17/2019 1212   AST 18 10/04/2021 1120   ALT 14 10/04/2021  1120   ALKPHOS 58 10/04/2021 1120   BILITOT 0.7 10/04/2021 1120   BILITOT 0.4  11/17/2019 1212   GFRNONAA >60 10/06/2021 0110   GFRAA 92 11/17/2019 1212   Lab Results  Component Value Date   CHOL 210 (H) 11/17/2019   HDL 53 11/17/2019   LDLCALC 132 (H) 11/17/2019   TRIG 142 11/17/2019   CHOLHDL 4.0 11/17/2019   No results found for: "HGBA1C" Lab Results  Component Value Date   VITAMINB12 653 10/04/2021   Lab Results  Component Value Date   TSH 1.305 10/04/2021   10/06/21 MRI brain 1. No acute intracranial abnormality. 2. Stable MRI appearance of the brain since May. 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  ASSESSMENT AND PLAN  70 y.o. year old female here with:   Dx:  No diagnosis found.    PLAN:  MIGRAINE WITH AURA (tried topiramate; no benefit) - sumatriptan (per PCP) or excedrin OTC as needed   SHORT TERM MEMORY LOSS (could be related to pain, anxiety, depression issues) - safety / supervision issues reviewed - daily physical activity / exercise (at least 15-30 minutes) - eat more plants / vegetables - increase social activities, brain stimulation, games, puzzles, hobbies, crafts, arts, music - aim for at least 7-8 hours sleep per night (or more) - avoid smoking and alcohol - caregiver resources provided - caution with medications, finances, driving  Return for pending if symptoms worsen or fail to improve, return to PCP.  Virtual Visit via Video Note  I connected with Caroline [name] on 02/17/22 at  2:15 PM EST by a video enabled telemedicine application and verified that I am speaking with the correct person using two identifiers.   I discussed the limitations of evaluation and management by telemedicine and the availability of in person appointments. The patient expressed understanding and agreed to proceed.  Patient is at home and I am at the office.   I spent 15 minutes of face-to-face and non-face-to-face time with patient.  This included previsit chart review, lab review, study review, order entry, electronic  health record documentation, patient education.       Suanne Marker, MD 02/17/2022, 2:34 PM Certified in Neurology, Neurophysiology and Neuroimaging  [facility] [address], [address] [place], [place] [address] [phone]
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
## 2022-02-21 ENCOUNTER — Ambulatory Visit: Payer: Medicare HMO | Admitting: Rehabilitative and Restorative Service Providers"

## 2022-02-21 ENCOUNTER — Encounter: Payer: Self-pay | Admitting: Rehabilitative and Restorative Service Providers"

## 2022-02-21 ENCOUNTER — Other Ambulatory Visit: Payer: Self-pay

## 2022-02-21 DIAGNOSIS — R262 Difficulty in walking, not elsewhere classified: Secondary | ICD-10-CM

## 2022-02-21 DIAGNOSIS — M6281 Muscle weakness (generalized): Secondary | ICD-10-CM | POA: Diagnosis not present

## 2022-02-21 DIAGNOSIS — M25571 Pain in right ankle and joints of right foot: Secondary | ICD-10-CM | POA: Diagnosis not present

## 2022-03-05 ENCOUNTER — Ambulatory Visit: Payer: Medicare HMO | Admitting: Rehabilitative and Restorative Service Providers"

## 2022-03-05 ENCOUNTER — Encounter: Payer: Self-pay | Admitting: Rehabilitative and Restorative Service Providers"

## 2022-03-05 DIAGNOSIS — M6281 Muscle weakness (generalized): Secondary | ICD-10-CM | POA: Diagnosis not present

## 2022-03-05 DIAGNOSIS — R262 Difficulty in walking, not elsewhere classified: Secondary | ICD-10-CM | POA: Diagnosis not present

## 2022-03-05 DIAGNOSIS — M25571 Pain in right ankle and joints of right foot: Secondary | ICD-10-CM
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
## 2022-03-07 ENCOUNTER — Ambulatory Visit: Payer: Medicare HMO | Admitting: Rehabilitative and Restorative Service Providers"

## 2022-03-07 ENCOUNTER — Encounter: Payer: Self-pay | Admitting: Rehabilitative and Restorative Service Providers"

## 2022-03-07 DIAGNOSIS — M25571 Pain in right ankle and joints of right foot: Secondary | ICD-10-CM

## 2022-03-07 DIAGNOSIS — M6281 Muscle weakness (generalized): Secondary | ICD-10-CM | POA: Diagnosis not present

## 2022-03-07 DIAGNOSIS — R262 Difficulty in walking, not elsewhere classified: Secondary | ICD-10-CM
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
## 2022-03-11 ENCOUNTER — Ambulatory Visit: Payer: Medicare HMO | Admitting: Rehabilitative and Restorative Service Providers"

## 2022-03-11 ENCOUNTER — Encounter: Payer: Self-pay | Admitting: Rehabilitative and Restorative Service Providers"

## 2022-03-11 DIAGNOSIS — R262 Difficulty in walking, not elsewhere classified: Secondary | ICD-10-CM

## 2022-03-11 DIAGNOSIS — M25571 Pain in right ankle and joints of right foot: Secondary | ICD-10-CM | POA: Diagnosis not present

## 2022-03-11 DIAGNOSIS — M6281 Muscle weakness (generalized): Secondary | ICD-10-CM
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
## 2022-03-13 ENCOUNTER — Encounter: Payer: Self-pay | Admitting: Rehabilitative and Restorative Service Providers"

## 2022-03-13 ENCOUNTER — Ambulatory Visit: Payer: Medicare HMO | Admitting: Rehabilitative and Restorative Service Providers"

## 2022-03-13 DIAGNOSIS — M25571 Pain in right ankle and joints of right foot: Secondary | ICD-10-CM | POA: Diagnosis not present

## 2022-03-13 DIAGNOSIS — M6281 Muscle weakness (generalized): Secondary | ICD-10-CM | POA: Diagnosis not present

## 2022-03-13 DIAGNOSIS — R262 Difficulty in walking, not elsewhere classified: Secondary | ICD-10-CM

## 2022-03-13 NOTE — Addendum Note (Signed)
Addended by: Chyrel Masson B on: 03/13/2022 11:00 AM   Modules accepted: Orders

## 2022-03-13 NOTE — Therapy (Signed)
OUTPATIENT PHYSICAL THERAPY TREATMENT   Patient Name: Caroline [name] MRN: [id number] DOB:Apr 30, 1950, 71 y.o., female Today's Date: 03/13/2022  END OF SESSION:  PT End of Session - 03/13/22 1045     Visit Number 5    Number of Visits 20    Date for PT Re-Evaluation 05/02/22    Authorization Type [employer] $10 copay    Authorization Time Period -05/02/2022    Authorization - Visit Number 5    Authorization - Number of Visits 12    PT Start Time 1055    PT Stop Time 1127    PT Time Calculation (min) 32 min    Activity Tolerance Patient tolerated treatment well    Behavior During Therapy WFL for tasks assessed/performed                 Past Medical History:  Diagnosis Date   Allergy    Zyrtec, Benadryl   Anxiety    Zoloft since several years   Asthma    Balance problem    Cataract    Complication of anesthesia    GERD (gastroesophageal reflux disease)    Hyperlipidemia    Hypertension    Memory changes    Migraines    topmax and Imitrex   Past Surgical History:  Procedure Laterality Date   ABDOMINAL HYSTERECTOMY  2016   uterine prolapse; ovaries INTACT   BLADDER REPAIR     BREAST BIOPSY     NL   BREAST EXCISIONAL BIOPSY     BREAST SURGERY     breast bx x 2 in 1980s; benign   CARPAL TUNNEL RELEASE Bilateral    CATARACT EXTRACTION, BILATERAL  2018   TOTAL KNEE ARTHROPLASTY Left 10/01/2021   Procedure: LEFT TOTAL KNEE ARTHROPLASTY;  Surgeon: Meredith Pel, MD;  Location: [place];  Service: Orthopedics;  Laterality: Left;   Patient Active Problem List   Diagnosis Date Noted   Arthritis of left knee    Acute encephalopathy/delirium  10/04/2021   S/P total knee arthroplasty, left 10/01/2021   Postcalcaneal bursitis of right foot 03/11/2021   Primary osteoarthritis of both knees 03/11/2021   Chronic pain of both knees 03/11/2021   Asthma due to seasonal allergies 11/17/2019   Osteopenia of multiple sites 10/19/2018   Chronic sinusitis 06/10/2018    Essential hypertension, benign 10/22/2017   Benign hematuria 06/17/2017   Pure hypercholesterolemia 08/11/2016   Anxiety and depression 08/11/2016   Chronic migraine without aura without status migrainosus, not intractable 08/11/2016    PCP: Caren Macadam MD  REFERRING PROVIDER: Meredith Pel, MD  REFERRING DIAG: [phone] (ICD-10-CM) - Pain in right ankle and joints of right foot  THERAPY DIAG:  Pain in right ankle and joints of right foot  Muscle weakness (generalized)  Difficulty in walking, not elsewhere classified  Rationale for Evaluation and Treatment: Rehabilitation  ONSET DATE: April 2023  SUBJECTIVE:   SUBJECTIVE STATEMENT: Pt indicated feeling some off and on pain, mainly an achy feel.  Pt indicated no specific changes in activity load at home yesterday.   PERTINENT HISTORY: History of Lt knee replacement  PAIN:  NPRS scale: 5/10 Pain location: Rt posterior ankle Pain description: burns, toothache Aggravating factors: walking/standing, worse with day activity Relieving factors: elevation and ice/rest, Voltaren gel  PRECAUTIONS: None  WEIGHT BEARING RESTRICTIONS: No  FALLS:  Has patient fallen in last 6 months? No  LIVING ENVIRONMENT: Lives in: House/apartment Stairs: 1 step into without rail  OCCUPATION: Retired  PLOF: Independent , [place]  responsibility.  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  Previously did walking for exercise.  Ride stationary bike  PATIENT GOALS: Reduce pain, be able to walk more  Next MD visit:  none scheduled at eval time   OBJECTIVE:   PATIENT SURVEYS:  03/13/2022:  FOTO update:  42  02/21/2022 FOTO intake: 41   predicted:  47  COGNITION: 02/21/2022 Overall cognitive status: WFL    MUSCLE LENGTH: 02/21/2022 No specific testing  POSTURE:  02/21/2022 unremarkable   PALPATION: 02/21/2022 Tenderness in distal approximate 2 inches of achilles near insertion Rt ankle.   No specific thickening of tissue in area to  touch.   LOWER EXTREMITY ROM:   ROM Right 02/21/2022 Left 02/21/2022 Right 03/05/2022  Hip flexion     Hip extension     Hip abduction     Hip adduction     Hip internal rotation     Hip external rotation     Knee flexion     Knee extension     Ankle dorsiflexion 9 AROM in 90 deg knee flexion / 3 AROM in 0 deg knee flexion 11  AROM in 90 deg knee flexion / 4 AROM in 0 deg knee flexion 21 AROM in 90 deg knee flexion / 10 AROM in 0 deg knee flexion  Ankle plantarflexion [facility]   Ankle inversion     Ankle eversion      (Blank rows = not tested)  LOWER EXTREMITY MMT:  MMT Right 02/21/2022 Left 02/21/2022 Right 03/11/2022  Hip flexion 5/5 5/5   Hip extension     Hip abduction     Hip adduction     Hip internal rotation     Hip external rotation     Knee flexion 5/5 5/5   Knee extension 5/5 5/5   Ankle dorsiflexion 5/5 5/5   Ankle plantarflexion 3+/5 c pain (2 reps in standing ) 5/5 4/5 15 reps in standing   Ankle inversion 5/5 5/5   Ankle eversion 5/5 5/5    (Blank rows = not tested)  LOWER EXTREMITY SPECIAL TESTS:  02/21/2022 No specific testing today  FUNCTIONAL TESTS:  02/21/2022 18 inch chair transfer: independent s UE on first try Lt SLS: < 3 seconds prior to loss of balance Rt SLS:< 3 seconds prior to loss of balance c pain noted  GAIT: 02/21/2022 Independent ambulation c decreased toe off progress Rt leg resulting in decreased stance on Rt and decreased step length c Lt foot   TODAY'S TREATMENT                                                                          DATE: 03/13/2022 Therex: Recumbent bike lvl 2 5 mins, seat 3  Incline runner stretch 30 sec x 5 c Rt leg posterior  Neuro Re-ed  Tandem ambulation 10 ft in // bars fwd/back x 5 each way Cross over stepping lateral  15 ft x 4 each way Fitter board rocker fwd/back light touching focus x 25 SLS c contralateral leg light touch to foam corners x 8 each, performed bilaterally c light finger tip  assist  Ionto Dexamethasone 4-6 hour patch 1.0 mL to posterior Rt ankle - 5 min set up/education  TODAY'S TREATMENT  DATE: 03/11/2022 Therex: Recumbent bike lvl 3 5 mins, seat 3  Rt leg only PF x 15 PF two leg up , Rt leg eccentric lowering on incline board 2 x 10  Incline runner stretch gastroc Rt leg posterior 30 sec x 5 Lateral step down 4 inch 2 x 10 bilateral no UE assist   Neuro Re-ed  Tandem ambulation on foam 6 ft in // bars fwd/back x 8 each way Side to side stepping in // bars on foam 6 ft x 5 each way Fitter board rocker fwd/back light touching focus x 25 SLS c contralateral leg light touch to foam corners x 8 each, performed bilaterally  TODAY'S TREATMENT                                                                          DATE: 03/07/2022 Therex: Recumbent bike lvl 3 5 mins, seat 3  Leg press double leg 50 lbs x 15, single leg x 15 bilateral 31 lbs Standing double leg PF c eccentric lowering most of weight on Rt 3 x 10  on lincline board Incline runner stretch gastroc Rt leg posterior 30 sec x 5   Neuro Re-ed  [employer] rocker fwd/back light touching focus x 25 Tandem stance on foam 1 min x 1 bilateral SLS vector reaching light touch contralateral leg fwd/side/back x 8 each bilateral SLS c contralateral leg light touch to foam corners x 8 each, performed bilaterally    PATIENT EDUCATION:  02/21/2022 Education details: HEP, POC Person educated: Patient Education method: Consulting civil engineer, Media planner, Verbal cues, and Handouts Education comprehension: verbalized understanding, returned demonstration, and verbal cues required  HOME EXERCISE PROGRAM: Access Code: [facility] URL: [email or web address] Date: 03/05/2022 Prepared by: Scot Jun  Exercises - Gastroc Stretch on Wall  - 2-3 x daily - 7 x weekly - 1 sets - 3-5 reps - 30 hold - Standing Bilateral Gastroc  Stretch with Step  - 2-3 x daily - 7 x weekly - 1 sets - 3-5 reps - 30 hold - Ankle Plantar Flexion with Resistance  - 2 x daily - 7 x weekly - 3 sets - 10-15 reps - Standing Heel Raise with Support  - 2 x daily - 7 x weekly - 3 sets - 10 reps  ASSESSMENT:  CLINICAL IMPRESSION: Exacerbation of symptoms in last 24 hours led to less intervention in clinic.  First application of ionto today.  Pt may continue to benefit from skilled PT services to improve walking/standing tolerance.  FOTO minimally improved today compared to evaluation.   OBJECTIVE IMPAIRMENTS: Abnormal gait, decreased activity tolerance, decreased balance, decreased coordination, decreased endurance, decreased mobility, difficulty walking, decreased ROM, decreased strength, hypomobility, impaired perceived functional ability, impaired flexibility, improper body mechanics, and pain.   ACTIVITY LIMITATIONS: standing, squatting, sleeping, stairs, transfers, and locomotion level  PARTICIPATION LIMITATIONS: cleaning, laundry, interpersonal relationship, shopping, community activity, and exercise  PERSONAL FACTORS: Time since onset of injury/illness/exacerbation are also affecting patient's functional outcome.   REHAB POTENTIAL: Good  CLINICAL DECISION MAKING: Stable/uncomplicated  EVALUATION COMPLEXITY: Low   GOALS: Goals reviewed with patient? Yes  SHORT TERM GOALS: (target date for Short term goals are 3 weeks 03/14/2022)   1.  Patient will demonstrate independent use  of home exercise program to maintain progress from in clinic treatments.  Goal status: Met 03/11/2022  LONG TERM GOALS: (target dates for all long term goals are 10 weeks  05/02/2022 )   1. Patient will demonstrate/report pain at worst less than or equal to 2/10 to facilitate minimal limitation in daily activity secondary to pain symptoms.  Goal status: on going 03/13/2022   2. Patient will demonstrate independent use of home exercise program to facilitate  ability to maintain/progress functional gains from skilled physical therapy services.  Goal status: on going 03/13/2022   3. Patient will demonstrate FOTO outcome > or = 47 % to indicate reduced disability due to condition.  Goal status: on going 03/13/2022   4.  Patient will demonstrate Rt LE MMT 5/5 throughout to faciltiate usual transfers, stairs, squatting at [facility] for daily life.   Goal status: on going 03/13/2022   5.  Patient will demonstrate bilateral DF in 0 deg knee flexion > 10 to facilitate mobility for normalized ambulation at PLOF.  Goal status: on going 03/13/2022   6.  Patient will demonstrate/report ability to sleep through night s disruption.  Goal status: on going 03/13/2022   7.  Patient will demonstrate bilateral SLS > 10 seconds to facilitate ability to walk on even and uneven surfaces with better control.  Goal Status: on going 03/13/2022   PLAN:  PT FREQUENCY: 1-2x/week  PT DURATION: 10 weeks  PLANNED INTERVENTIONS: Therapeutic exercises, Therapeutic activity, Neuro Muscular re-education, Balance training, Gait training, Patient/Family education, Joint mobilization, Stair training, DME instructions, Dry Needling, Electrical stimulation, Traction, Cryotherapy, vasopneumatic deviceMoist heat, Taping, Ultrasound, Ionotophoresis 61m/ml Dexamethasone, and Manual therapy.  All included unless contraindicated  PLAN FOR NEXT SESSION:  Ionto, loading achilles.    MScot Jun PT, DPT, OCS, ATC 03/13/22  11:54 AM    Referring diagnosis? M25.571 (ICD-10-CM) - Pain in right ankle and joints of right foot Treatment diagnosis? (if different than referring diagnosis) M25.571 (ICD-10-CM) - Pain in right ankle and joints of right foot What was this (referring dx) caused by? _0  Surgery _1  Fall _2  Ongoing issue _3  Arthritis _4  Other: ____________  Laterality: _5  Rt _6  Lt _7  Both  Check all possible CPT codes:  *CHOOSE 10 OR LESS*    _8  97110 (Therapeutic  Exercise)  _9  92507 (SLP Treatment)  _10  940981(Neuro Re-ed)   _11  92526 (Swallowing Treatment)   _12  919147(Gait Training)   _13  982956(Cognitive Training, 1st 15 minutes) _14  97140 (Manual Therapy)   _15  97130 (Cognitive Training, each add'l 15 minutes)  _16  97164 (Re-evaluation)                              _17  Other, List CPT Code ____________  _18  921308(Therapeutic Activities)     _19  965784(Self Care)   _20  All codes above (97110 - 97535)  _21  97012 (Mechanical Traction)  _22  97014 (E-stim Unattended)  _23  97032 (E-stim manual)  _24  97033 (Ionto)  _25  97035 (Ultrasound) _26  97750 (Physical Performance Training) _27  969629(Aquatic Therapy) _28  97016 (Vasopneumatic Device) _29  952841(Paraffin) _30  932440(Contrast Bath) _31  97597 (Wound Care 1st 20 sq cm) _32  97598 (Wound Care each add'l 20 sq cm) _33  97760 (Orthotic Fabrication, Fitting, Training Initial) _34  [id number](Prosthetic Management and Training Initial) _35  [id number](Orthotic or Prosthetic Training/ Modification Subsequent)

## 2022-03-17 ENCOUNTER — Ambulatory Visit: Payer: Medicare HMO | Admitting: Physical Therapy

## 2022-03-17 ENCOUNTER — Encounter: Payer: Self-pay | Admitting: Physical Therapy

## 2022-03-17 DIAGNOSIS — R262 Difficulty in walking, not elsewhere classified: Secondary | ICD-10-CM | POA: Diagnosis not present

## 2022-03-17 DIAGNOSIS — M6281 Muscle weakness (generalized): Secondary | ICD-10-CM

## 2022-03-17 DIAGNOSIS — M25571 Pain in right ankle and joints of right foot: Secondary | ICD-10-CM | POA: Diagnosis not present

## 2022-03-17 NOTE — Therapy (Signed)
OUTPATIENT PHYSICAL THERAPY TREATMENT   Patient Name: Caroline [name] MRN: [id number] DOB:Sep 20, 1950, 71 y.o., female Today's Date: 03/17/2022  END OF SESSION:  PT End of Session - 03/17/22 0851     Visit Number 6    Number of Visits 20    Date for PT Re-Evaluation 05/02/22    Authorization Type [employer] $10 copay    Authorization Time Period -05/02/2022    Authorization - Visit Number 6    Authorization - Number of Visits 12    PT Start Time 0845    PT Stop Time 0923    PT Time Calculation (min) 38 min    Activity Tolerance Patient tolerated treatment well    Behavior During Therapy WFL for tasks assessed/performed                 Past Medical History:  Diagnosis Date   Allergy    Zyrtec, Benadryl   Anxiety    Zoloft since several years   Asthma    Balance problem    Cataract    Complication of anesthesia    GERD (gastroesophageal reflux disease)    Hyperlipidemia    Hypertension    Memory changes    Migraines    topmax and Imitrex   Past Surgical History:  Procedure Laterality Date   ABDOMINAL HYSTERECTOMY  2016   uterine prolapse; ovaries INTACT   BLADDER REPAIR     BREAST BIOPSY     NL   BREAST EXCISIONAL BIOPSY     BREAST SURGERY     breast bx x 2 in 1980s; benign   CARPAL TUNNEL RELEASE Bilateral    CATARACT EXTRACTION, BILATERAL  2018   TOTAL KNEE ARTHROPLASTY Left 10/01/2021   Procedure: LEFT TOTAL KNEE ARTHROPLASTY;  Surgeon: Meredith Pel, MD;  Location: [facility];  Service: Orthopedics;  Laterality: Left;   Patient Active Problem List   Diagnosis Date Noted   Arthritis of left knee    Acute encephalopathy/delirium  10/04/2021   S/P total knee arthroplasty, left 10/01/2021   Postcalcaneal bursitis of right foot 03/11/2021   Primary osteoarthritis of both knees 03/11/2021   Chronic pain of both knees 03/11/2021   Asthma due to seasonal allergies 11/17/2019   Osteopenia of multiple sites 10/19/2018   Chronic sinusitis 06/10/2018    Essential hypertension, benign 10/22/2017   Benign hematuria 06/17/2017   Pure hypercholesterolemia 08/11/2016   Anxiety and depression 08/11/2016   Chronic migraine without aura without status migrainosus, not intractable 08/11/2016    PCP: Caren Macadam MD  REFERRING PROVIDER: Meredith Pel, MD  REFERRING DIAG: [phone] (ICD-10-CM) - Pain in right ankle and joints of right foot  THERAPY DIAG:  Pain in right ankle and joints of right foot  Muscle weakness (generalized)  Difficulty in walking, not elsewhere classified  Rationale for Evaluation and Treatment: Rehabilitation  ONSET DATE: April 2023  SUBJECTIVE:   SUBJECTIVE STATEMENT: Pt states not too much pain this morning, 2/10. She can't tell any difference from the [place]TO.  PERTINENT HISTORY: History of Lt knee replacement  PAIN:  NPRS scale: 2/10 Pain location: Rt posterior ankle Pain description: burns, toothache Aggravating factors: walking/standing, worse with day activity Relieving factors: elevation and ice/rest, Voltaren gel  PRECAUTIONS: None  WEIGHT BEARING RESTRICTIONS: No  FALLS:  Has patient fallen in last 6 months? 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  Previously did  walking for exercise.  Ride stationary bike  PATIENT GOALS: Reduce pain, be able to walk more  Next MD visit:  none scheduled at eval time   OBJECTIVE:   PATIENT SURVEYS:  03/13/2022:  FOTO update:  42  02/21/2022 FOTO intake: 41   predicted:  47  COGNITION: 02/21/2022 Overall cognitive status: WFL    MUSCLE LENGTH: 02/21/2022 No specific testing  POSTURE:  02/21/2022 unremarkable   PALPATION: 02/21/2022 Tenderness in distal approximate 2 inches of achilles near insertion Rt ankle.   No specific thickening of tissue in area to touch.   LOWER EXTREMITY ROM:   ROM  Right 02/21/2022 Left 02/21/2022 Right 03/05/2022  Hip flexion     Hip extension     Hip abduction     Hip adduction     Hip internal rotation     Hip external rotation     Knee flexion     Knee extension     Ankle dorsiflexion 9 AROM in 90 deg knee flexion / 3 AROM in 0 deg knee flexion 11  AROM in 90 deg knee flexion / 4 AROM in 0 deg knee flexion 21 AROM in 90 deg knee flexion / 10 AROM in 0 deg knee flexion  Ankle plantarflexion [facility]   Ankle inversion     Ankle eversion      (Blank rows = not tested)  LOWER EXTREMITY MMT:  MMT Right 02/21/2022 Left 02/21/2022 Right 03/11/2022  Hip flexion 5/5 5/5   Hip extension     Hip abduction     Hip adduction     Hip internal rotation     Hip external rotation     Knee flexion 5/5 5/5   Knee extension 5/5 5/5   Ankle dorsiflexion 5/5 5/5   Ankle plantarflexion 3+/5 c pain (2 reps in standing ) 5/5 4/5 15 reps in standing   Ankle inversion 5/5 5/5   Ankle eversion 5/5 5/5    (Blank rows = not tested)  LOWER EXTREMITY SPECIAL TESTS:  02/21/2022 No specific testing today  FUNCTIONAL TESTS:  02/21/2022 18 inch chair transfer: independent s UE on first try Lt SLS: < 3 seconds prior to loss of balance Rt SLS:< 3 seconds prior to loss of balance c pain noted  GAIT: 02/21/2022 Independent ambulation c decreased toe off progress Rt leg resulting in decreased stance on Rt and decreased step length c Lt foot   TODAY'S TREATMENT                                                                          DATE: 03/17/2022 Therex: Recumbent bike lvl 2 5 mins, seat 3  Slantboard stretch 30 sec X 3 gastroc, 30 sec X 3 soleus Heel and toe raises 2X15 Ankle DF,INV,EV with green X 15 each, ankle PF with green 2X10  KT tape to assist achilles tendon   Neuro Re-ed  Tandem ambulation on foam in // bars x 5 round trips Side stepping ambulation on foam in // bars x 5 round trips KeySpan rocker fwd/back light touching focus 1 min,  then lateral 1 min    TODAY'S TREATMENT  DATE: 03/13/2022 Therex: Recumbent bike lvl 2 5 mins, seat 3  Incline runner stretch 30 sec x 5 c Rt leg posterior  Neuro Re-ed  Tandem ambulation 10 ft in // bars fwd/back x 5 each way Cross over stepping lateral  15 ft x 4 each way Fitter board rocker fwd/back light touching focus x 25 SLS c contralateral leg light touch to foam corners x 8 each, performed bilaterally c light finger tip assist  Ionto Dexamethasone 4-6 hour patch 1.0 mL to posterior Rt ankle - 5 min set up/education  TODAY'S TREATMENT                                                                          DATE: 03/11/2022 Therex: Recumbent bike lvl 3 5 mins, seat 3  Rt leg only PF x 15 PF two leg up , Rt leg eccentric lowering on incline board 2 x 10  Incline runner stretch gastroc Rt leg posterior 30 sec x 5 Lateral step down 4 inch 2 x 10 bilateral no UE assist   Neuro Re-ed  Tandem ambulation on foam 6 ft in // bars fwd/back x 8 each way Side to side stepping in // bars on foam 6 ft x 5 each way Fitter board rocker fwd/back light touching focus x 25 SLS c contralateral leg light touch to foam corners x 8 each, performed bilaterally  TODAY'S TREATMENT                                                                          DATE: 03/07/2022 Therex: Recumbent bike lvl 3 5 mins, seat 3  Leg press double leg 50 lbs x 15, single leg x 15 bilateral 31 lbs Standing double leg PF c eccentric lowering most of weight on Rt 3 x 10  on lincline board Incline runner stretch gastroc Rt leg posterior 30 sec x 5   Neuro Re-ed  KeySpan rocker fwd/back light touching focus x 25 Tandem stance on foam 1 min x 1 bilateral SLS vector reaching light touch contralateral leg fwd/side/back x 8 each bilateral SLS c contralateral leg light touch to foam corners x 8 each, performed bilaterally    PATIENT  EDUCATION:  02/21/2022 Education details: HEP, POC Person educated: Patient Education method: Consulting civil engineer, Media planner, Verbal cues, and Handouts Education comprehension: verbalized understanding, returned demonstration, and verbal cues required  HOME EXERCISE PROGRAM: Access Code: [facility] URL: [email or web address] Date: 03/05/2022 Prepared by: Scot Jun  Exercises - Gastroc Stretch on Wall  - 2-3 x daily - 7 x weekly - 1 sets - 3-5 reps - 30 hold - Standing Bilateral Gastroc Stretch with Step  - 2-3 x daily - 7 x weekly - 1 sets - 3-5 reps - 30 hold - Ankle Plantar Flexion with Resistance  - 2 x daily - 7 x weekly - 3 sets - 10-15 reps - Standing Heel Raise with Support  - 2 x daily - 7 x  weekly - 3 sets - 10 reps  ASSESSMENT:  CLINICAL IMPRESSION: Less overall pain noted as early session and has not had to do much yet. She deferred inoto as did not feel it helped any. I did try KT tape today to assist her Achilles so we will see how she does with this.   OBJECTIVE IMPAIRMENTS: Abnormal gait, decreased activity tolerance, decreased balance, decreased coordination, decreased endurance, decreased mobility, difficulty walking, decreased ROM, decreased strength, hypomobility, impaired perceived functional ability, impaired flexibility, improper body mechanics, and pain.   ACTIVITY LIMITATIONS: standing, squatting, sleeping, stairs, transfers, and locomotion level  PARTICIPATION LIMITATIONS: cleaning, laundry, interpersonal relationship, shopping, community activity, and exercise  PERSONAL FACTORS: Time since onset of injury/illness/exacerbation are also affecting patient's functional outcome.   REHAB POTENTIAL: Good  CLINICAL DECISION MAKING: Stable/uncomplicated  EVALUATION COMPLEXITY: Low   GOALS: Goals reviewed with patient? Yes  SHORT TERM GOALS: (target date for Short term goals are 3 weeks 03/14/2022)   1.  Patient will demonstrate independent use  of home exercise program to maintain progress from in clinic treatments.  Goal status: Met 03/11/2022  LONG TERM GOALS: (target dates for all long term goals are 10 weeks  05/02/2022 )   1. Patient will demonstrate/report pain at worst less than or equal to 2/10 to facilitate minimal limitation in daily activity secondary to pain symptoms.  Goal status: on going 03/13/2022   2. Patient will demonstrate independent use of home exercise program to facilitate ability to maintain/progress functional gains from skilled physical therapy services.  Goal status: on going 03/13/2022   3. Patient will demonstrate FOTO outcome > or = 47 % to indicate reduced disability due to condition.  Goal status: on going 03/13/2022   4.  Patient will demonstrate Rt LE MMT 5/5 throughout to faciltiate usual transfers, stairs, squatting at [facility] for daily life.   Goal status: on going 03/13/2022   5.  Patient will demonstrate bilateral DF in 0 deg knee flexion > 10 to facilitate mobility for normalized ambulation at PLOF.  Goal status: on going 03/13/2022   6.  Patient will demonstrate/report ability to sleep through night s disruption.  Goal status: on going 03/13/2022   7.  Patient will demonstrate bilateral SLS > 10 seconds to facilitate ability to walk on even and uneven surfaces with better control.  Goal Status: on going 03/13/2022   PLAN:  PT FREQUENCY: 1-2x/week  PT DURATION: 10 weeks  PLANNED INTERVENTIONS: Therapeutic exercises, Therapeutic activity, Neuro Muscular re-education, Balance training, Gait training, Patient/Family education, Joint mobilization, Stair training, DME instructions, Dry Needling, Electrical stimulation, Traction, Cryotherapy, vasopneumatic deviceMoist heat, Taping, Ultrasound, Ionotophoresis 61m/ml Dexamethasone, and Manual therapy.  All included unless contraindicated  PLAN FOR NEXT SESSION:  how was KT tape, loading and stretching achilles.    BElsie Ra PT,  DPT 03/17/22 8:52 AM    Referring diagnosis? M25.571 (ICD-10-CM) - Pain in right ankle and joints of right foot Treatment diagnosis? (if different than referring diagnosis) M25.571 (ICD-10-CM) - Pain in right ankle and joints of right foot What was this (referring dx) caused by? [] Surgery [] Fall [x] Ongoing issue [] Arthritis [] Other: ____________  Laterality: [x] Rt [] Lt [] Both  Check all possible CPT codes:  *CHOOSE 10 OR LESS*    [] 953748(Therapeutic Exercise)  [] 927078(SLP Treatment)  [] 967544(Neuro Re-ed)   [] 992010(Swallowing Treatment)   [] 907121(Gait Training)   [] [id number](Cognitive Training, 1st 15 minutes) []  97140 (Manual Therapy)   [] 01093 (Cognitive Training, each add'l 15 minutes)  [] [id number] (Re-evaluation)                              [] Other, List CPT Code ____________  [] 23557 (Therapeutic Activities)     [] [phone] (Self Care)   [x] All codes above (97110 - 54270)  [] 62376 (Mechanical Traction)  [x] 97014 (E-stim Unattended)  [] 28315 (E-stim manual)  [] 17616 (Ionto)  [] 07371 (Ultrasound) [x] 06269 (Physical Performance Training) [] [id number] (Aquatic Therapy) [] [id number] (Vasopneumatic Device) [] [id number] (Paraffin) [] 48546 (Contrast Bath) [] [id number] (Wound Care 1st 20 sq cm) [] 27035 (Wound Care each add'l 20 sq cm) [] 00938 (Orthotic Fabrication, Fitting, Training Initial) [] [id number] (Prosthetic Management and Training Initial) [] [id number] (Orthotic or Prosthetic Training/ Modification Subsequent)

## 2022-03-20 ENCOUNTER — Encounter: Payer: Self-pay | Admitting: Physical Therapy

## 2022-03-20 ENCOUNTER — Ambulatory Visit: Payer: Medicare HMO | Admitting: Physical Therapy

## 2022-03-20 DIAGNOSIS — M6281 Muscle weakness (generalized): Secondary | ICD-10-CM | POA: Diagnosis not present

## 2022-03-20 DIAGNOSIS — R262 Difficulty in walking, not elsewhere classified: Secondary | ICD-10-CM

## 2022-03-20 DIAGNOSIS — M25571 Pain in right ankle and joints of right foot: Secondary | ICD-10-CM

## 2022-03-20 NOTE — Therapy (Signed)
OUTPATIENT PHYSICAL THERAPY TREATMENT   Patient Name: Caroline [name] MRN: [id number] DOB:1950-10-25, 71 y.o., female Today's Date: 03/20/2022  END OF SESSION:  PT End of Session - 03/20/22 0844     Visit Number 7    Number of Visits 20    Date for PT Re-Evaluation 05/02/22    Authorization Type [employer] $10 copay    Authorization Time Period -05/02/2022    Authorization - Visit Number 7    Authorization - Number of Visits 12    PT Start Time 0845    PT Stop Time 0928    PT Time Calculation (min) 43 min    Activity Tolerance Patient tolerated treatment well    Behavior During Therapy WFL for tasks assessed/performed                 Past Medical History:  Diagnosis Date   Allergy    Zyrtec, Benadryl   Anxiety    Zoloft since several years   Asthma    Balance problem    Cataract    Complication of anesthesia    GERD (gastroesophageal reflux disease)    Hyperlipidemia    Hypertension    Memory changes    Migraines    topmax and Imitrex   Past Surgical History:  Procedure Laterality Date   ABDOMINAL HYSTERECTOMY  2016   uterine prolapse; ovaries INTACT   BLADDER REPAIR     BREAST BIOPSY     NL   BREAST EXCISIONAL BIOPSY     BREAST SURGERY     breast bx x 2 in 1980s; benign   CARPAL TUNNEL RELEASE Bilateral    CATARACT EXTRACTION, BILATERAL  2018   TOTAL KNEE ARTHROPLASTY Left 10/01/2021   Procedure: LEFT TOTAL KNEE ARTHROPLASTY;  Surgeon: Meredith Pel, MD;  Location: [facility];  Service: Orthopedics;  Laterality: Left;   Patient Active Problem List   Diagnosis Date Noted   Arthritis of left knee    Acute encephalopathy/delirium  10/04/2021   S/P total knee arthroplasty, left 10/01/2021   Postcalcaneal bursitis of right foot 03/11/2021   Primary osteoarthritis of both knees 03/11/2021   Chronic pain of both knees 03/11/2021   Asthma due to seasonal allergies 11/17/2019   Osteopenia of multiple sites 10/19/2018   Chronic sinusitis 06/10/2018    Essential hypertension, benign 10/22/2017   Benign hematuria 06/17/2017   Pure hypercholesterolemia 08/11/2016   Anxiety and depression 08/11/2016   Chronic migraine without aura without status migrainosus, not intractable 08/11/2016    PCP: Caren Macadam MD  REFERRING PROVIDER: Meredith Pel, MD  REFERRING DIAG: [id number] (ICD-10-CM) - Pain in right ankle and joints of right foot  THERAPY DIAG:  Pain in right ankle and joints of right foot  Muscle weakness (generalized)  Difficulty in walking, not elsewhere classified  Rationale for Evaluation and Treatment: Rehabilitation  ONSET DATE: April 2023  SUBJECTIVE:   SUBJECTIVE STATEMENT: Pt states not too much pain this morning, but was bad yesterday and does not know why. She could not tell the KT tape did anything.  PERTINENT HISTORY: History of Lt knee replacement  PAIN:  NPRS scale: 2/10 Pain location: Rt posterior ankle Pain description: burns, toothache Aggravating factors: walking/standing, worse with day activity Relieving factors: elevation and ice/rest, Voltaren gel  PRECAUTIONS: None  WEIGHT BEARING RESTRICTIONS: No  FALLS:  Has patient fallen in last 6 months? No  LIVING ENVIRONMENT: Lives in: House/apartment Stairs: 1 step into without rail  OCCUPATION: Retired  PLOF: Independent ,  Housework responsibility.  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  Previously did walking for exercise.  Ride stationary bike  PATIENT GOALS: Reduce pain, be able to walk more  Next MD visit:  none scheduled at eval time   OBJECTIVE:   PATIENT SURVEYS:  03/13/2022:  FOTO update:  42  02/21/2022 FOTO intake: 41   predicted:  47  COGNITION: 02/21/2022 Overall cognitive status: WFL    MUSCLE LENGTH: 02/21/2022 No specific testing  POSTURE:  02/21/2022 unremarkable   PALPATION: 02/21/2022 Tenderness in distal approximate 2 inches of achilles near insertion Rt ankle.   No specific thickening of tissue in area to  touch.   LOWER EXTREMITY ROM:   ROM Right 02/21/2022 Left 02/21/2022 Right 03/05/2022  Hip flexion     Hip extension     Hip abduction     Hip adduction     Hip internal rotation     Hip external rotation     Knee flexion     Knee extension     Ankle dorsiflexion 9 AROM in 90 deg knee flexion / 3 AROM in 0 deg knee flexion 11  AROM in 90 deg knee flexion / 4 AROM in 0 deg knee flexion 21 AROM in 90 deg knee flexion / 10 AROM in 0 deg knee flexion  Ankle plantarflexion [facility]   Ankle inversion     Ankle eversion      (Blank rows = not tested)  LOWER EXTREMITY MMT:  MMT Right 02/21/2022 Left 02/21/2022 Right 03/11/2022  Hip flexion 5/5 5/5   Hip extension     Hip abduction     Hip adduction     Hip internal rotation     Hip external rotation     Knee flexion 5/5 5/5   Knee extension 5/5 5/5   Ankle dorsiflexion 5/5 5/5   Ankle plantarflexion 3+/5 c pain (2 reps in standing ) 5/5 4/5 15 reps in standing   Ankle inversion 5/5 5/5   Ankle eversion 5/5 5/5    (Blank rows = not tested)  LOWER EXTREMITY SPECIAL TESTS:  02/21/2022 No specific testing today  FUNCTIONAL TESTS:  02/21/2022 18 inch chair transfer: independent s UE on first try Lt SLS: < 3 seconds prior to loss of balance Rt SLS:< 3 seconds prior to loss of balance c pain noted  GAIT: 02/21/2022 Independent ambulation c decreased toe off progress Rt leg resulting in decreased stance on Rt and decreased step length c Lt foot   TODAY'S TREATMENT                                                                          DATE: 03/20/2022 Therex: Recumbent bike lvl 2 5 mins, seat 3  Slantboard stretch 30 sec X 3 gastroc, 30 sec X 3 soleus Heel and toe raises 2X15 ankle PF with green 2X10  Neuro Re-ed  Tandem ambulation on foam in // bars x 5 round trips Side stepping ambulation on foam in // bars x 5 round trips [employer] rocker fwd/back light touching focus 1.5 min, then lateral 1.5 min  Manual  therapy for STM and IASTM with graston tool to Rt achillies, plantar fasica and gastroc;soleus. skilled palpation and Trigger Point Dry-Needling  Treatment instructions: Expect mild to moderate muscle soreness. Patient Consent Given: Yes Education handout provided: verbally provided Muscles treated: Rt gastroc-solus, achillis tendon.  Treatment response/outcome: good overall tolerance,twitch response noted     TODAY'S TREATMENT                                                                          DATE: 03/17/2022 Therex: Recumbent bike lvl 2 5 mins, seat 3  Slantboard stretch 30 sec X 3 gastroc, 30 sec X 3 soleus Heel and toe raises 2X15 Ankle DF,INV,EV with green X 15 each, ankle PF with green 2X10  KT tape to assist achilles tendon   Neuro Re-ed  Tandem ambulation on foam in // bars x 5 round trips Side stepping ambulation on foam in // bars x 5 round trips [employer] rocker fwd/back light touching focus 1 min, then lateral 1 min    TODAY'S TREATMENT                                                                          DATE: 03/13/2022 Therex: Recumbent bike lvl 2 5 mins, seat 3  Incline runner stretch 30 sec x 5 c Rt leg posterior  Neuro Re-ed  Tandem ambulation 10 ft in // bars fwd/back x 5 each way Cross over stepping lateral  15 ft x 4 each way Fitter board rocker fwd/back light touching focus x 25 SLS c contralateral leg light touch to foam corners x 8 each, performed bilaterally c light finger tip assist  Ionto Dexamethasone 4-6 hour patch 1.0 mL to posterior Rt ankle - 5 min set up/education    PATIENT EDUCATION:  02/21/2022 Education details: HEP, POC Person educated: Patient Education method: Consulting civil engineer, Media planner, Verbal cues, and Handouts Education comprehension: verbalized understanding, returned demonstration, and verbal cues required  HOME EXERCISE PROGRAM: Access Code: [facility] URL: [email or web address] Date:  03/05/2022 Prepared by: Scot Jun  Exercises - Gastroc Stretch on Wall  - 2-3 x daily - 7 x weekly - 1 sets - 3-5 reps - 30 hold - Standing Bilateral Gastroc Stretch with Step  - 2-3 x daily - 7 x weekly - 1 sets - 3-5 reps - 30 hold - Ankle Plantar Flexion with Resistance  - 2 x daily - 7 x weekly - 3 sets - 10-15 reps - Standing Heel Raise with Support  - 2 x daily - 7 x weekly - 3 sets - 10 reps  ASSESSMENT:  CLINICAL IMPRESSION: She is still getting pain in her Rt heel at achillies insertion. KT tape did not help so this was deferred today. I did try more manual therapy and DN to see if this provides any more lasting relief.   OBJECTIVE IMPAIRMENTS: Abnormal gait, decreased activity tolerance, decreased balance, decreased coordination, decreased endurance, decreased mobility, difficulty walking, decreased ROM, decreased strength, hypomobility, impaired perceived functional ability, impaired flexibility, improper body mechanics, and pain.   ACTIVITY LIMITATIONS: standing, squatting, sleeping, stairs, transfers,  and locomotion level  PARTICIPATION LIMITATIONS: cleaning, laundry, interpersonal relationship, shopping, community activity, and exercise  PERSONAL FACTORS: Time since onset of injury/illness/exacerbation are also affecting patient's functional outcome.   REHAB POTENTIAL: Good  CLINICAL DECISION MAKING: Stable/uncomplicated  EVALUATION COMPLEXITY: Low   GOALS: Goals reviewed with patient? Yes  SHORT TERM GOALS: (target date for Short term goals are 3 weeks 03/14/2022)   1.  Patient will demonstrate independent use of home exercise program to maintain progress from in clinic treatments.  Goal status: Met 03/11/2022  LONG TERM GOALS: (target dates for all long term goals are 10 weeks  05/02/2022 )   1. Patient will demonstrate/report pain at worst less than or equal to 2/10 to facilitate minimal limitation in daily activity secondary to pain symptoms.  Goal  status: on going 03/13/2022   2. Patient will demonstrate independent use of home exercise program to facilitate ability to maintain/progress functional gains from skilled physical therapy services.  Goal status: on going 03/13/2022   3. Patient will demonstrate FOTO outcome > or = 47 % to indicate reduced disability due to condition.  Goal status: on going 03/13/2022   4.  Patient will demonstrate Rt LE MMT 5/5 throughout to faciltiate usual transfers, stairs, squatting at [facility] for daily life.   Goal status: on going 03/13/2022   5.  Patient will demonstrate bilateral DF in 0 deg knee flexion > 10 to facilitate mobility for normalized ambulation at PLOF.  Goal status: on going 03/13/2022   6.  Patient will demonstrate/report ability to sleep through night s disruption.  Goal status: on going 03/13/2022   7.  Patient will demonstrate bilateral SLS > 10 seconds to facilitate ability to walk on even and uneven surfaces with better control.  Goal Status: on going 03/13/2022   PLAN:  PT FREQUENCY: 1-2x/week  PT DURATION: 10 weeks  PLANNED INTERVENTIONS: Therapeutic exercises, Therapeutic activity, Neuro Muscular re-education, Balance training, Gait training, Patient/Family education, Joint mobilization, Stair training, DME instructions, Dry Needling, Electrical stimulation, Traction, Cryotherapy, vasopneumatic deviceMoist heat, Taping, Ultrasound, Ionotophoresis 69m/ml Dexamethasone, and Manual therapy.  All included unless contraindicated  PLAN FOR NEXT SESSION:  how was manual therapy from last time. Loading and stretching achilles.    BElsie Ra PT, DPT 03/20/22 9:31 AM    Referring diagnosis? M25.571 (ICD-10-CM) - Pain in right ankle and joints of right foot Treatment diagnosis? (if different than referring diagnosis) M25.571 (ICD-10-CM) - Pain in right ankle and joints of right foot What was this (referring dx) caused by? _0  Surgery _1  Fall _2  Ongoing issue _3   Arthritis _4  Other: ____________  Laterality: _5  Rt _6  Lt _7  Both  Check all possible CPT codes:  *CHOOSE 10 OR LESS*    _8  97110 (Therapeutic Exercise)  _9  92507 (SLP Treatment)  _10  997353(Neuro Re-ed)   _11  929924(Swallowing Treatment)   _12  926834(Gait Training)   _13  919622(Cognitive Training, 1st 15 minutes) _14  97140 (Manual Therapy)   _15  97130 (Cognitive Training, each add'l 15 minutes)  _16  97164 (Re-evaluation)                              _17  Other, List CPT Code ____________  _18  929798(Therapeutic Activities)     _19  992119(Self Care)   _20  All codes above (97110 - 941740  _21  97012 (Mechanical Traction)  _22  97014 (E-stim Unattended)  _23  97032 (E-stim manual)  _24  97033 (Ionto)  _25  981448(  Ultrasound) _0  97750 (Physical Performance Training) _1  [id number] (Aquatic Therapy) _2  97016 (Vasopneumatic Device) _3  [id number] (Paraffin) _4  97034 (Contrast Bath) _5  97597 (Wound Care 1st 20 sq cm) _6  97598 (Wound Care each add'l 20 sq cm) _7  97760 (Orthotic Fabrication, Fitting, Training Initial) _8  [id number] (Prosthetic Management and Training Initial) _9  [id number] (Orthotic or Prosthetic Training/ Modification Subsequent)

## 2022-03-24 ENCOUNTER — Encounter: Payer: Self-pay | Admitting: Rehabilitative and Restorative Service Providers"

## 2022-03-24 ENCOUNTER — Ambulatory Visit: Payer: Medicare HMO | Admitting: Rehabilitative and Restorative Service Providers"

## 2022-03-24 DIAGNOSIS — M6281 Muscle weakness (generalized): Secondary | ICD-10-CM | POA: Diagnosis not present

## 2022-03-24 DIAGNOSIS — M25571 Pain in right ankle and joints of right foot: Secondary | ICD-10-CM | POA: Diagnosis not present

## 2022-03-24 DIAGNOSIS — R262 Difficulty in walking, not elsewhere classified: Secondary | ICD-10-CM

## 2022-03-24 NOTE — Therapy (Signed)
OUTPATIENT PHYSICAL THERAPY TREATMENT   Patient Name: Caroline [name] MRN: [id number] DOB:01-18-1951, 71 y.o., female Today's Date: 03/24/2022  END OF SESSION:  PT End of Session - 03/24/22 0921     Visit Number 8    Number of Visits 20    Date for PT Re-Evaluation 05/02/22    Authorization Type [employer] $10 copay    Authorization Time Period -05/02/2022    Authorization - Visit Number 8    Authorization - Number of Visits 12    PT Start Time 0921    PT Stop Time 1001    PT Time Calculation (min) 40 min    Activity Tolerance Patient tolerated treatment well    Behavior During Therapy WFL for tasks assessed/performed                  Past Medical History:  Diagnosis Date   Allergy    Zyrtec, Benadryl   Anxiety    Zoloft since several years   Asthma    Balance problem    Cataract    Complication of anesthesia    GERD (gastroesophageal reflux disease)    Hyperlipidemia    Hypertension    Memory changes    Migraines    topmax and Imitrex   Past Surgical History:  Procedure Laterality Date   ABDOMINAL HYSTERECTOMY  2016   uterine prolapse; ovaries INTACT   BLADDER REPAIR     BREAST BIOPSY     NL   BREAST EXCISIONAL BIOPSY     BREAST SURGERY     breast bx x 2 in 1980s; benign   CARPAL TUNNEL RELEASE Bilateral    CATARACT EXTRACTION, BILATERAL  2018   TOTAL KNEE ARTHROPLASTY Left 10/01/2021   Procedure: LEFT TOTAL KNEE ARTHROPLASTY;  Surgeon: Meredith Pel, MD;  Location: [facility];  Service: Orthopedics;  Laterality: Left;   Patient Active Problem List   Diagnosis Date Noted   Arthritis of left knee    Acute encephalopathy/delirium  10/04/2021   S/P total knee arthroplasty, left 10/01/2021   Postcalcaneal bursitis of right foot 03/11/2021   Primary osteoarthritis of both knees 03/11/2021   Chronic pain of both knees 03/11/2021   Asthma due to seasonal allergies 11/17/2019   Osteopenia of multiple sites 10/19/2018   Chronic sinusitis 06/10/2018    Essential hypertension, benign 10/22/2017   Benign hematuria 06/17/2017   Pure hypercholesterolemia 08/11/2016   Anxiety and depression 08/11/2016   Chronic migraine without aura without status migrainosus, not intractable 08/11/2016    PCP: Caren Macadam MD  REFERRING PROVIDER: Meredith Pel, MD  REFERRING DIAG: [phone] (ICD-10-CM) - Pain in right ankle and joints of right foot  THERAPY DIAG:  Pain in right ankle and joints of right foot  Muscle weakness (generalized)  Difficulty in walking, not elsewhere classified  Rationale for Evaluation and Treatment: Rehabilitation  ONSET DATE: April 2023  SUBJECTIVE:   SUBJECTIVE STATEMENT: Pt indicated Rt ankle has been "same old same old".  Voltaren gel helps.  Pt indicated having soreness after last visit.   PERTINENT HISTORY: History of Lt knee replacement  PAIN:  NPRS scale: 2/10 Pain location: Rt posterior ankle Pain description: burns, toothache Aggravating factors: walking/standing, worse with day activity Relieving factors: elevation and ice/rest, Voltaren gel  PRECAUTIONS: None  WEIGHT BEARING RESTRICTIONS: No  FALLS:  Has patient fallen in last 6 months? 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  Previously did walking for exercise.  Ride stationary bike  PATIENT GOALS: Reduce pain, be able to walk more  Next MD visit:  none scheduled at eval time   OBJECTIVE:   PATIENT SURVEYS:  03/13/2022:  FOTO update:  42  02/21/2022 FOTO intake: 41   predicted:  47  COGNITION: 02/21/2022 Overall cognitive status: WFL    MUSCLE LENGTH: 02/21/2022 No specific testing  POSTURE:  02/21/2022 unremarkable   PALPATION: 02/21/2022 Tenderness in distal approximate 2 inches of achilles near insertion Rt ankle.   No specific thickening of tissue in area to touch.    LOWER EXTREMITY ROM:   ROM Right 02/21/2022 Left 02/21/2022 Right 03/05/2022  Hip flexion     Hip extension     Hip abduction     Hip adduction     Hip internal rotation     Hip external rotation     Knee flexion     Knee extension     Ankle dorsiflexion 9 AROM in 90 deg knee flexion / 3 AROM in 0 deg knee flexion 11  AROM in 90 deg knee flexion / 4 AROM in 0 deg knee flexion 21 AROM in 90 deg knee flexion / 10 AROM in 0 deg knee flexion  Ankle plantarflexion [facility]   Ankle inversion     Ankle eversion      (Blank rows = not tested)  LOWER EXTREMITY MMT:  MMT Right 02/21/2022 Left 02/21/2022 Right 03/11/2022  Hip flexion 5/5 5/5   Hip extension     Hip abduction     Hip adduction     Hip internal rotation     Hip external rotation     Knee flexion 5/5 5/5   Knee extension 5/5 5/5   Ankle dorsiflexion 5/5 5/5   Ankle plantarflexion 3+/5 c pain (2 reps in standing ) 5/5 4/5 15 reps in standing   Ankle inversion 5/5 5/5   Ankle eversion 5/5 5/5    (Blank rows = not tested)  LOWER EXTREMITY SPECIAL TESTS:  02/21/2022 No specific testing today  FUNCTIONAL TESTS:  02/21/2022 18 inch chair transfer: independent s UE on first try Lt SLS: < 3 seconds prior to loss of balance Rt SLS:< 3 seconds prior to loss of balance c pain noted  GAIT: 03/24/2022:  normalized independent ambulation in clinic.   02/21/2022 Independent ambulation c decreased toe off progress Rt leg resulting in decreased stance on Rt and decreased step length c Lt foot   TODAY'S TREATMENT                                                                          DATE: 03/24/2022 Therex: Recumbent bike lvl 2 7 mins, seat 3  Slantboard stretch 30 sec X 5 gastroc Double leg PF c eccentric Rt leg lowering 2 x 10   Manual therapy c percussive device to Rt calf   Trigger Point Dry-Needling  Treatment instructions: Expect mild to moderate muscle soreness. Patient Consent Given: Yes Muscles  treated: Rt gastroc-solus  Treatment response/outcome: good overall tolerance,twitch response noted   Ionto Dexamethasone 4-6 hour patch 1.0 mL to posterior Rt ankle  TODAY'S TREATMENT  DATE: 03/20/2022 Therex: Recumbent bike lvl 2 5 mins, seat 3  Slantboard stretch 30 sec X 3 gastroc, 30 sec X 3 soleus Heel and toe raises 2X15 ankle PF with green 2X10  Neuro Re-ed  Tandem ambulation on foam in // bars x 5 round trips Side stepping ambulation on foam in // bars x 5 round trips [employer] rocker fwd/back light touching focus 1.5 min, then lateral 1.5 min  Manual therapy for STM and IASTM with graston tool to Rt achillies, plantar fasica and gastroc;soleus. skilled palpation and Trigger Point Dry-Needling  Treatment instructions: Expect mild to moderate muscle soreness. Patient Consent Given: Yes Education handout provided: verbally provided Muscles treated: Rt gastroc-solus, achillis tendon.  Treatment response/outcome: good overall tolerance,twitch response noted      PATIENT EDUCATION:  02/21/2022 Education details: HEP, POC Person educated: Patient Education method: Consulting civil engineer, Demonstration, Verbal cues, and Handouts Education comprehension: verbalized understanding, returned demonstration, and verbal cues required  HOME EXERCISE PROGRAM: Access Code: [facility] URL: [email or web address] Date: 03/05/2022 Prepared by: Scot Jun  Exercises - Gastroc Stretch on Wall  - 2-3 x daily - 7 x weekly - 1 sets - 3-5 reps - 30 hold - Standing Bilateral Gastroc Stretch with Step  - 2-3 x daily - 7 x weekly - 1 sets - 3-5 reps - 30 hold - Ankle Plantar Flexion with Resistance  - 2 x daily - 7 x weekly - 3 sets - 10-15 reps - Standing Heel Raise with Support  - 2 x daily - 7 x weekly - 3 sets - 10 reps  ASSESSMENT:  CLINICAL IMPRESSION: During performance of trigger point treatments,  possible connection to concordant symptoms noted during twitch response and percussive device usage.  Recommend continued manual treatment as indicated and ionto to address complaints.   OBJECTIVE IMPAIRMENTS: Abnormal gait, decreased activity tolerance, decreased balance, decreased coordination, decreased endurance, decreased mobility, difficulty walking, decreased ROM, decreased strength, hypomobility, impaired perceived functional ability, impaired flexibility, improper body mechanics, and pain.   ACTIVITY LIMITATIONS: standing, squatting, sleeping, stairs, transfers, and locomotion level  PARTICIPATION LIMITATIONS: cleaning, laundry, interpersonal relationship, shopping, community activity, and exercise  PERSONAL FACTORS: Time since onset of injury/illness/exacerbation are also affecting patient's functional outcome.   REHAB POTENTIAL: Good  CLINICAL DECISION MAKING: Stable/uncomplicated  EVALUATION COMPLEXITY: Low   GOALS: Goals reviewed with patient? Yes  SHORT TERM GOALS: (target date for Short term goals are 3 weeks 03/14/2022)   1.  Patient will demonstrate independent use of home exercise program to maintain progress from in clinic treatments.  Goal status: Met 03/11/2022  LONG TERM GOALS: (target dates for all long term goals are 10 weeks  05/02/2022 )   1. Patient will demonstrate/report pain at worst less than or equal to 2/10 to facilitate minimal limitation in daily activity secondary to pain symptoms.  Goal status: on going 03/13/2022   2. Patient will demonstrate independent use of home exercise program to facilitate ability to maintain/progress functional gains from skilled physical therapy services.  Goal status: on going 03/13/2022   3. Patient will demonstrate FOTO outcome > or = 47 % to indicate reduced disability due to condition.  Goal status: on going 03/13/2022   4.  Patient will demonstrate Rt LE MMT 5/5 throughout to faciltiate usual transfers, stairs,  squatting at [facility] for daily life.   Goal status: on going 03/13/2022   5.  Patient will demonstrate bilateral DF in 0 deg knee flexion > 10 to facilitate mobility for normalized ambulation at  PLOF.  Goal status: on going 03/13/2022   6.  Patient will demonstrate/report ability to sleep through night s disruption.  Goal status: on going 03/13/2022   7.  Patient will demonstrate bilateral SLS > 10 seconds to facilitate ability to walk on even and uneven surfaces with better control.  Goal Status: on going 03/13/2022   PLAN:  PT FREQUENCY: 1-2x/week  PT DURATION: 10 weeks  PLANNED INTERVENTIONS: Therapeutic exercises, Therapeutic activity, Neuro Muscular re-education, Balance training, Gait training, Patient/Family education, Joint mobilization, Stair training, DME instructions, Dry Needling, Electrical stimulation, Traction, Cryotherapy, vasopneumatic deviceMoist heat, Taping, Ultrasound, Ionotophoresis 97m/ml Dexamethasone, and Manual therapy.  All included unless contraindicated  PLAN FOR NEXT SESSION: Trigger point treatment as indicated.  Ionto for additional visits (due to Voltaren being helpful).    MScot Jun PT, DPT, OCS, ATC 03/24/22  10:05 AM        Referring diagnosis? M25.571 (ICD-10-CM) - Pain in right ankle and joints of right foot Treatment diagnosis? (if different than referring diagnosis) M25.571 (ICD-10-CM) - Pain in right ankle and joints of right foot What was this (referring dx) caused by? _0  Surgery _1  Fall _2  Ongoing issue _3  Arthritis _4  Other: ____________  Laterality: _5  Rt _6  Lt _7  Both  Check all possible CPT codes:  *CHOOSE 10 OR LESS*    _8  97110 (Therapeutic Exercise)  _9  92507 (SLP Treatment)  _10  944461(Neuro Re-ed)   _11  92526 (Swallowing Treatment)   _12  990122(Gait Training)   _13  924114(Cognitive Training, 1st 15 minutes) _14  97140 (Manual Therapy)   _15  97130 (Cognitive Training, each add'l 15 minutes)  _16  97164 (Re-evaluation)                               _17  Other, List CPT Code ____________  _18  964314(Therapeutic Activities)     _19  927670(Self Care)   _20  All codes above (97110 - 97535)  _21  97012 (Mechanical Traction)  _22  97014 (E-stim Unattended)  _23  97032 (E-stim manual)  _24  97033 (Ionto)  _25  97035 (Ultrasound) _26  97750 (Physical Performance Training) _27  911003(Aquatic Therapy) _28  97016 (Vasopneumatic Device) _29  949611(Paraffin) _30  964353(Contrast Bath) _31  97597 (Wound Care 1st 20 sq cm) _32  97598 (Wound Care each add'l 20 sq cm) _33  97760 (Orthotic Fabrication, Fitting, Training Initial) _34  [id number](Prosthetic Management and Training Initial) _35  [id number](Orthotic or Prosthetic Training/ Modification Subsequent)

## 2022-03-27 ENCOUNTER — Encounter: Payer: Self-pay | Admitting: Rehabilitative and Restorative Service Providers"

## 2022-03-27 ENCOUNTER — Ambulatory Visit: Payer: Medicare HMO | Admitting: Rehabilitative and Restorative Service Providers"

## 2022-03-27 DIAGNOSIS — M25571 Pain in right ankle and joints of right foot: Secondary | ICD-10-CM

## 2022-03-27 DIAGNOSIS — R262 Difficulty in walking, not elsewhere classified: Secondary | ICD-10-CM

## 2022-03-27 DIAGNOSIS — M6281 Muscle weakness (generalized): Secondary | ICD-10-CM

## 2022-03-27 NOTE — Therapy (Signed)
OUTPATIENT PHYSICAL THERAPY TREATMENT   Patient Name: Caroline [name] MRN: [id number] DOB:[birth year], 71 y.o., female Today's Date: 03/27/2022  END OF SESSION:  PT End of Session - 03/27/22 0832     Visit Number 9    Number of Visits 20    Date for PT Re-Evaluation 05/02/22    Authorization Type [employer] $10 copay    Authorization Time Period -05/02/2022    Authorization - Number of Visits 12    PT Start Time 0832    PT Stop Time 0912    PT Time Calculation (min) 40 min    Activity Tolerance Patient tolerated treatment well    Behavior During Therapy WFL for tasks assessed/performed              Past Medical History:  Diagnosis Date   Allergy    Zyrtec, Benadryl   Anxiety    Zoloft since several years   Asthma    Balance problem    Cataract    Complication of anesthesia    GERD (gastroesophageal reflux disease)    Hyperlipidemia    Hypertension    Memory changes    Migraines    topmax and Imitrex   Past Surgical History:  Procedure Laterality Date   ABDOMINAL HYSTERECTOMY  2016   uterine prolapse; ovaries INTACT   BLADDER REPAIR     BREAST BIOPSY     NL   BREAST EXCISIONAL BIOPSY     BREAST SURGERY     breast bx x 2 in 1980s; benign   CARPAL TUNNEL RELEASE Bilateral    CATARACT EXTRACTION, BILATERAL  2018   TOTAL KNEE ARTHROPLASTY Left 10/01/2021   Procedure: LEFT TOTAL KNEE ARTHROPLASTY;  Surgeon: Meredith Pel, MD;  Location: [facility];  Service: Orthopedics;  Laterality: Left;   Patient Active Problem List   Diagnosis Date Noted   Arthritis of left knee    Acute encephalopathy/delirium  10/04/2021   S/P total knee arthroplasty, left 10/01/2021   Postcalcaneal bursitis of right foot 03/11/2021   Primary osteoarthritis of both knees 03/11/2021   Chronic pain of both knees 03/11/2021   Asthma due to seasonal allergies 11/17/2019   Osteopenia of multiple sites 10/19/2018   Chronic sinusitis 06/10/2018   Essential hypertension, benign 10/22/2017    Benign hematuria 06/17/2017   Pure hypercholesterolemia 08/11/2016   Anxiety and depression 08/11/2016   Chronic migraine without aura without status migrainosus, not intractable 08/11/2016    PCP: Caren Macadam MD  REFERRING PROVIDER: Meredith Pel, MD  REFERRING DIAG: [phone] (ICD-10-CM) - Pain in right ankle and joints of right foot  THERAPY DIAG:  Pain in right ankle and joints of right foot  Muscle weakness (generalized)  Difficulty in walking, not elsewhere classified  Rationale for Evaluation and Treatment: Rehabilitation  ONSET DATE: April 2023  SUBJECTIVE:   SUBJECTIVE STATEMENT: Pt indicated not as bad yesterday.  Pt indicated having soreness after visit from calf to lower ankle from treatment up into early yesterday.    PERTINENT HISTORY: History of Lt knee replacement  PAIN:  NPRS scale: 0/10 pain upon arrival.  Pain location: Rt posterior ankle Pain description: burns, toothache Aggravating factors: walking/standing, worse with day activity Relieving factors: elevation and ice/rest, Voltaren gel  PRECAUTIONS: None  WEIGHT BEARING RESTRICTIONS: No  FALLS:  Has patient fallen in last 6 months? 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  Previously did walking for exercise.  Ride stationary bike  PATIENT GOALS: Reduce pain, be able to walk more  Next MD visit:  none scheduled at eval time   OBJECTIVE:   PATIENT SURVEYS:  03/13/2022:  FOTO update:  42  02/21/2022 FOTO intake: 41   predicted:  47  COGNITION: 02/21/2022 Overall cognitive status: WFL    MUSCLE LENGTH: 02/21/2022 No specific testing  POSTURE:  02/21/2022 unremarkable   PALPATION: 02/21/2022 Tenderness in distal approximate 2 inches of achilles near insertion Rt ankle.   No specific thickening of tissue in area to touch.   LOWER  EXTREMITY ROM:   ROM Right 02/21/2022 Left 02/21/2022 Right 03/05/2022  Hip flexion     Hip extension     Hip abduction     Hip adduction     Hip internal rotation     Hip external rotation     Knee flexion     Knee extension     Ankle dorsiflexion 9 AROM in 90 deg knee flexion / 3 AROM in 0 deg knee flexion 11  AROM in 90 deg knee flexion / 4 AROM in 0 deg knee flexion 21 AROM in 90 deg knee flexion / 10 AROM in 0 deg knee flexion  Ankle plantarflexion [facility]   Ankle inversion     Ankle eversion      (Blank rows = not tested)  LOWER EXTREMITY MMT:  MMT Right 02/21/2022 Left 02/21/2022 Right 03/11/2022 Right 03/27/2022  Hip flexion 5/5 5/5    Hip extension      Hip abduction      Hip adduction      Hip internal rotation      Hip external rotation      Knee flexion 5/5 5/5    Knee extension 5/5 5/5    Ankle dorsiflexion 5/5 5/5    Ankle plantarflexion 3+/5 c pain (2 reps in standing ) 5/5 4/5 15 reps in standing  5/5 20 reps in standing single leg  Ankle inversion 5/5 5/5    Ankle eversion 5/5 5/5     (Blank rows = not tested)  LOWER EXTREMITY SPECIAL TESTS:  02/21/2022 No specific testing today  FUNCTIONAL TESTS:  02/21/2022 18 inch chair transfer: independent s UE on first try Lt SLS: < 3 seconds prior to loss of balance Rt SLS:< 3 seconds prior to loss of balance c pain noted  GAIT: 03/24/2022:  normalized independent ambulation in clinic.   02/21/2022 Independent ambulation c decreased toe off progress Rt leg resulting in decreased stance on Rt and decreased step length c Lt foot   TODAY'S TREATMENT                                                                          DATE: 03/27/2022 Therex: Recumbent bike lvl 3 6 mins, seat 3  Slantboard stretch 30 sec X 5 gastroc Double leg PF c eccentric Rt leg lowering 3 x 10 on incline board  Manual therapy c percussive device to Rt calf   Trigger Point Dry-Needling  Treatment instructions: Expect mild  to moderate muscle soreness. Patient Consent Given: Yes Muscles treated: Rt gastroc-solus  Treatment response/outcome: good overall tolerance,twitch response noted   Ionto  Dexamethasone 4-6 hour patch 1.0 mL to posterior Rt ankle  TODAY'S TREATMENT                                                                          DATE: 03/24/2022 Therex: Recumbent bike lvl 2 7 mins, seat 3  Slantboard stretch 30 sec X 5 gastroc Double leg PF c eccentric Rt leg lowering 2 x 10   Manual therapy c percussive device to Rt calf   Trigger Point Dry-Needling  Treatment instructions: Expect mild to moderate muscle soreness. Patient Consent Given: Yes Muscles treated: Rt gastroc-solus  Treatment response/outcome: good overall tolerance,twitch response noted   Ionto Dexamethasone 4-6 hour patch 1.0 mL to posterior Rt ankle  TODAY'S TREATMENT                                                                          DATE: 03/20/2022 Therex: Recumbent bike lvl 2 5 mins, seat 3  Slantboard stretch 30 sec X 3 gastroc, 30 sec X 3 soleus Heel and toe raises 2X15 ankle PF with green 2X10  Neuro Re-ed  Tandem ambulation on foam in // bars x 5 round trips Side stepping ambulation on foam in // bars x 5 round trips [employer] rocker fwd/back light touching focus 1.5 min, then lateral 1.5 min  Manual therapy for STM and IASTM with graston tool to Rt achillies, plantar fasica and gastroc;soleus. skilled palpation and Trigger Point Dry-Needling  Treatment instructions: Expect mild to moderate muscle soreness. Patient Consent Given: Yes Education handout provided: verbally provided Muscles treated: Rt gastroc-solus, achillis tendon.  Treatment response/outcome: good overall tolerance,twitch response noted      PATIENT EDUCATION:  02/21/2022 Education details: HEP, POC Person educated: Patient Education method: Consulting civil engineer, Demonstration, Verbal cues, and Handouts Education comprehension:  verbalized understanding, returned demonstration, and verbal cues required  HOME EXERCISE PROGRAM: Access Code: [facility] URL: [email or web address] Date: 03/05/2022 Prepared by: Scot Jun  Exercises - Gastroc Stretch on Wall  - 2-3 x daily - 7 x weekly - 1 sets - 3-5 reps - 30 hold - Standing Bilateral Gastroc Stretch with Step  - 2-3 x daily - 7 x weekly - 1 sets - 3-5 reps - 30 hold - Ankle Plantar Flexion with Resistance  - 2 x daily - 7 x weekly - 3 sets - 10-15 reps - Standing Heel Raise with Support  - 2 x daily - 7 x weekly - 3 sets - 10 reps  ASSESSMENT:  CLINICAL IMPRESSION: Pt indicated possible increase in improvement with combination of use of manual therapy and needling to calf with ionto use in last few visits.  Plan to continue to help reduce pain complaints in affected area.   OBJECTIVE IMPAIRMENTS: Abnormal gait, decreased activity tolerance, decreased balance, decreased coordination, decreased endurance, decreased mobility, difficulty walking, decreased ROM, decreased strength, hypomobility, impaired perceived functional ability, impaired flexibility, improper body mechanics, and pain.   ACTIVITY LIMITATIONS: standing, squatting, sleeping, stairs,  transfers, and locomotion level  PARTICIPATION LIMITATIONS: cleaning, laundry, interpersonal relationship, shopping, community activity, and exercise  PERSONAL FACTORS: Time since onset of injury/illness/exacerbation are also affecting patient's functional outcome.   REHAB POTENTIAL: Good  CLINICAL DECISION MAKING: Stable/uncomplicated  EVALUATION COMPLEXITY: Low   GOALS: Goals reviewed with patient? Yes  SHORT TERM GOALS: (target date for Short term goals are 3 weeks 03/14/2022)   1.  Patient will demonstrate independent use of home exercise program to maintain progress from in clinic treatments.  Goal status: Met 03/11/2022  LONG TERM GOALS: (target dates for all long term goals are 10 weeks   05/02/2022 )   1. Patient will demonstrate/report pain at worst less than or equal to 2/10 to facilitate minimal limitation in daily activity secondary to pain symptoms.  Goal status: on going 03/13/2022   2. Patient will demonstrate independent use of home exercise program to facilitate ability to maintain/progress functional gains from skilled physical therapy services.  Goal status: on going 03/13/2022   3. Patient will demonstrate FOTO outcome > or = 47 % to indicate reduced disability due to condition.  Goal status: on going 03/13/2022   4.  Patient will demonstrate Rt LE MMT 5/5 throughout to faciltiate usual transfers, stairs, squatting at [facility] for daily life.   Goal status: on going 03/13/2022   5.  Patient will demonstrate bilateral DF in 0 deg knee flexion > 10 to facilitate mobility for normalized ambulation at PLOF.  Goal status: on going 03/13/2022   6.  Patient will demonstrate/report ability to sleep through night s disruption.  Goal status: on going 03/13/2022   7.  Patient will demonstrate bilateral SLS > 10 seconds to facilitate ability to walk on even and uneven surfaces with better control.  Goal Status: on going 03/13/2022   PLAN:  PT FREQUENCY: 1-2x/week  PT DURATION: 10 weeks  PLANNED INTERVENTIONS: Therapeutic exercises, Therapeutic activity, Neuro Muscular re-education, Balance training, Gait training, Patient/Family education, Joint mobilization, Stair training, DME instructions, Dry Needling, Electrical stimulation, Traction, Cryotherapy, vasopneumatic deviceMoist heat, Taping, Ultrasound, Ionotophoresis 41m/ml Dexamethasone, and Manual therapy.  All included unless contraindicated  PLAN FOR NEXT SESSION: Manual and dry needling as desired.  Ionto continued for at least a few more visits.  10th visit progress note.    MScot Jun PT, DPT, OCS, ATC 03/27/22  9:17 AM       Referring diagnosis? M25.571 (ICD-10-CM) - Pain in right ankle and joints of  right foot Treatment diagnosis? (if different than referring diagnosis) M25.571 (ICD-10-CM) - Pain in right ankle and joints of right foot What was this (referring dx) caused by? _0  Surgery _1  Fall _2  Ongoing issue _3  Arthritis _4  Other: ____________  Laterality: _5  Rt _6  Lt _7  Both  Check all possible CPT codes:  *CHOOSE 10 OR LESS*    _8  97110 (Therapeutic Exercise)  _9  92507 (SLP Treatment)  _10  909983(Neuro Re-ed)   _11  92526 (Swallowing Treatment)   _12  938250(Gait Training)   _13  953976(Cognitive Training, 1st 15 minutes) _14  97140 (Manual Therapy)   _15  97130 (Cognitive Training, each add'l 15 minutes)  _16  97164 (Re-evaluation)                              _17  Other, List CPT Code ____________  _18  973419(Therapeutic Activities)     _19  937902(Self Care)   _20  All codes above (97110 - 940973  _21  953299(Mechanical Traction)  [  x] 97014 (E-stim Unattended)  _0  97032 (E-stim manual)  _1  97033 (Ionto)  _2  77034 (Ultrasound) _3  97750 (Physical Performance Training) _4  [id number] (Aquatic Therapy) _5  97016 (Vasopneumatic Device) _6  03524 (Paraffin) _7  81859 (Contrast Bath) _8  97597 (Wound Care 1st 20 sq cm) _9  97598 (Wound Care each add'l 20 sq cm) _10  97760 (Orthotic Fabrication, Fitting, Training Initial) _11  [id number] (Prosthetic Management and Training Initial) _12  [id number] (Orthotic or Prosthetic Training/ Modification Subsequent)

## 2022-04-10 ENCOUNTER — Encounter: Payer: Self-pay | Admitting: Rehabilitative and Restorative Service Providers"

## 2022-04-10 ENCOUNTER — Ambulatory Visit: Payer: Medicare HMO | Admitting: Rehabilitative and Restorative Service Providers"

## 2022-04-10 DIAGNOSIS — R262 Difficulty in walking, not elsewhere classified: Secondary | ICD-10-CM | POA: Diagnosis not present

## 2022-04-10 DIAGNOSIS — M25571 Pain in right ankle and joints of right foot: Secondary | ICD-10-CM

## 2022-04-10 DIAGNOSIS — M6281 Muscle weakness (generalized): Secondary | ICD-10-CM

## 2022-04-10 NOTE — Therapy (Signed)
OUTPATIENT PHYSICAL THERAPY TREATMENT /PROGRESS NOTE   Patient Name: Caroline [name] MRN: [id number] DOB:1950-10-25, 72 y.o., female Today's Date: 04/10/2022  Progress Note Reporting Period 11/17/203 to 04/10/2022  See note below for Objective Data and Assessment of Progress/Goals.      END OF SESSION:  PT End of Session - 04/10/22 1022     Visit Number 10    Number of Visits 20    Date for PT Re-Evaluation 05/02/22    Authorization Type [employer] $10 copay    Authorization Time Period -05/02/2022    Authorization - Visit Number 10    Authorization - Number of Visits 12    Progress Note Due on Visit 12    PT Start Time 1015    PT Stop Time 1040    PT Time Calculation (min) 25 min    Activity Tolerance Patient tolerated treatment well    Behavior During Therapy WFL for tasks assessed/performed               Past Medical History:  Diagnosis Date   Allergy    Zyrtec, Benadryl   Anxiety    Zoloft since several years   Asthma    Balance problem    Cataract    Complication of anesthesia    GERD (gastroesophageal reflux disease)    Hyperlipidemia    Hypertension    Memory changes    Migraines    topmax and Imitrex   Past Surgical History:  Procedure Laterality Date   ABDOMINAL HYSTERECTOMY  2016   uterine prolapse; ovaries INTACT   BLADDER REPAIR     BREAST BIOPSY     NL   BREAST EXCISIONAL BIOPSY     BREAST SURGERY     breast bx x 2 in 1980s; benign   CARPAL TUNNEL RELEASE Bilateral    CATARACT EXTRACTION, BILATERAL  2018   TOTAL KNEE ARTHROPLASTY Left 10/01/2021   Procedure: LEFT TOTAL KNEE ARTHROPLASTY;  Surgeon: Meredith Pel, MD;  Location: [place];  Service: Orthopedics;  Laterality: Left;   Patient Active Problem List   Diagnosis Date Noted   Arthritis of left knee    Acute encephalopathy/delirium  10/04/2021   S/P total knee arthroplasty, left 10/01/2021   Postcalcaneal bursitis of right foot 03/11/2021   Primary osteoarthritis of both knees  03/11/2021   Chronic pain of both knees 03/11/2021   Asthma due to seasonal allergies 11/17/2019   Osteopenia of multiple sites 10/19/2018   Chronic sinusitis 06/10/2018   Essential hypertension, benign 10/22/2017   Benign hematuria 06/17/2017   Pure hypercholesterolemia 08/11/2016   Anxiety and depression 08/11/2016   Chronic migraine without aura without status migrainosus, not intractable 08/11/2016    PCP: Caren Macadam MD  REFERRING PROVIDER: Meredith Pel, MD  REFERRING DIAG: [phone] (ICD-10-CM) - Pain in right ankle and joints of right foot  THERAPY DIAG:  Pain in right ankle and joints of right foot  Muscle weakness (generalized)  Difficulty in walking, not elsewhere classified  Rationale for Evaluation and Treatment: Rehabilitation  ONSET DATE: April 2023  SUBJECTIVE:   SUBJECTIVE STATEMENT: Pt indicated doing well since last visit.  She indicated long shopping and walking without pain increases.  Pt indicated she has stayed routine c HEP.   Pt indicated feeling confidence.    PERTINENT HISTORY: History of Lt knee replacement  PAIN:  NPRS scale: 0/10 pain upon arrival.  Pain location: Rt posterior ankle Pain description: burns, toothache Aggravating factors: walking/standing, worse with day activity Relieving  factors: elevation and ice/rest, Voltaren gel  PRECAUTIONS: None  WEIGHT BEARING RESTRICTIONS: No  FALLS:  Has patient fallen in last 6 months? 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  Previously did walking for exercise.  Ride stationary bike  PATIENT GOALS: Reduce pain, be able to walk more  Next MD visit:  none scheduled at eval time   OBJECTIVE:   PATIENT SURVEYS:  03/13/2022:  FOTO update:  42  02/21/2022 FOTO intake: 41   predicted:  47  COGNITION: 02/21/2022 Overall cognitive  status: WFL    MUSCLE LENGTH: 02/21/2022 No specific testing  POSTURE:  02/21/2022 unremarkable   PALPATION: 02/21/2022 Tenderness in distal approximate 2 inches of achilles near insertion Rt ankle.   No specific thickening of tissue in area to touch.   LOWER EXTREMITY ROM:   ROM Right 02/21/2022 Left 02/21/2022 Right 03/05/2022  Hip flexion     Hip extension     Hip abduction     Hip adduction     Hip internal rotation     Hip external rotation     Knee flexion     Knee extension     Ankle dorsiflexion 9 AROM in 90 deg knee flexion / 3 AROM in 0 deg knee flexion 11  AROM in 90 deg knee flexion / 4 AROM in 0 deg knee flexion 21 AROM in 90 deg knee flexion / 10 AROM in 0 deg knee flexion  Ankle plantarflexion [facility]   Ankle inversion     Ankle eversion      (Blank rows = not tested)  LOWER EXTREMITY MMT:  MMT Right 02/21/2022 Left 02/21/2022 Right 03/11/2022 Right 03/27/2022  Hip flexion 5/5 5/5    Hip extension      Hip abduction      Hip adduction      Hip internal rotation      Hip external rotation      Knee flexion 5/5 5/5    Knee extension 5/5 5/5    Ankle dorsiflexion 5/5 5/5    Ankle plantarflexion 3+/5 c pain (2 reps in standing ) 5/5 4/5 15 reps in standing  5/5 20 reps in standing single leg  Ankle inversion 5/5 5/5    Ankle eversion 5/5 5/5     (Blank rows = not tested)  LOWER EXTREMITY SPECIAL TESTS:  02/21/2022 No specific testing today  FUNCTIONAL TESTS:  02/21/2022 18 inch chair transfer: independent s UE on first try Lt SLS: < 3 seconds prior to loss of balance Rt SLS:< 3 seconds prior to loss of balance c pain noted  GAIT: 03/24/2022:  normalized independent ambulation in clinic.   02/21/2022 Independent ambulation c decreased toe off progress Rt leg resulting in decreased stance on Rt and decreased step length c Lt foot   TODAY'S TREATMENT                                                                          DATE:  04/10/2022 Therex: Recumbent bike lvl 2 6 mins, seat 3  Verbal review of HEP.   Manual therapy c percussive device to  Rt calf  Ionto Dexamethasone 4-6 hour patch 1.0 mL to posterior Rt ankle  TODAY'S TREATMENT                                                                          DATE: 03/27/2022 Therex: Recumbent bike lvl 3 6 mins, seat 3  Slantboard stretch 30 sec X 5 gastroc Double leg PF c eccentric Rt leg lowering 3 x 10 on incline board  Manual therapy c percussive device to Rt calf   Trigger Point Dry-Needling  Treatment instructions: Expect mild to moderate muscle soreness. Patient Consent Given: Yes Muscles treated: Rt gastroc-solus  Treatment response/outcome: good overall tolerance,twitch response noted   Ionto Dexamethasone 4-6 hour patch 1.0 mL to posterior Rt ankle  TODAY'S TREATMENT                                                                          DATE: 03/24/2022 Therex: Recumbent bike lvl 2 7 mins, seat 3  Slantboard stretch 30 sec X 5 gastroc Double leg PF c eccentric Rt leg lowering 2 x 10   Manual therapy c percussive device to Rt calf   Trigger Point Dry-Needling  Treatment instructions: Expect mild to moderate muscle soreness. Patient Consent Given: Yes Muscles treated: Rt gastroc-solus  Treatment response/outcome: good overall tolerance,twitch response noted   Ionto Dexamethasone 4-6 hour patch 1.0 mL to posterior Rt ankle    PATIENT EDUCATION:  02/21/2022 Education details: HEP, POC Person educated: Patient Education method: Consulting civil engineer, Media planner, Verbal cues, and Handouts Education comprehension: verbalized understanding, returned demonstration, and verbal cues required  HOME EXERCISE PROGRAM: Access Code: [facility] URL: [email or web address] Date: 03/05/2022 Prepared by: Scot Jun  Exercises - Gastroc Stretch on Wall  - 2-3 x daily - 7 x weekly - 1 sets - 3-5 reps - 30 hold - Standing Bilateral  Gastroc Stretch with Step  - 2-3 x daily - 7 x weekly - 1 sets - 3-5 reps - 30 hold - Ankle Plantar Flexion with Resistance  - 2 x daily - 7 x weekly - 3 sets - 10-15 reps - Standing Heel Raise with Support  - 2 x daily - 7 x weekly - 3 sets - 10 reps  ASSESSMENT:  CLINICAL IMPRESSION: Pt has attended 10 visits overall during course of treatment.  Progress reported per Pt towards goals as noted.  Strength and mobility has been doing well as noted in objective data.  Pt has desire to schedule a visit 2 weeks from now but wants to trial HEP.   OBJECTIVE IMPAIRMENTS: Abnormal gait, decreased activity tolerance, decreased balance, decreased coordination, decreased endurance, decreased mobility, difficulty walking, decreased ROM, decreased strength, hypomobility, impaired perceived functional ability, impaired flexibility, improper body mechanics, and pain.   ACTIVITY LIMITATIONS: standing, squatting, sleeping, stairs, transfers, and locomotion level  PARTICIPATION LIMITATIONS: cleaning, laundry, interpersonal relationship, shopping, community activity, and exercise  PERSONAL FACTORS: Time since onset of injury/illness/exacerbation are also affecting patient's functional  outcome.   REHAB POTENTIAL: Good  CLINICAL DECISION MAKING: Stable/uncomplicated  EVALUATION COMPLEXITY: Low   GOALS: Goals reviewed with patient? Yes  SHORT TERM GOALS: (target date for Short term goals are 3 weeks 03/14/2022)   1.  Patient will demonstrate independent use of home exercise program to maintain progress from in clinic treatments.  Goal status: Met 03/11/2022  LONG TERM GOALS: (target dates for all long term goals are 10 weeks  05/02/2022 )   1. Patient will demonstrate/report pain at worst less than or equal to 2/10 to facilitate minimal limitation in daily activity secondary to pain symptoms.  Goal status: on going 04/10/2022   2. Patient will demonstrate independent use of home exercise program to  facilitate ability to maintain/progress functional gains from skilled physical therapy services.  Goal status: MET 04/10/2022   3. Patient will demonstrate FOTO outcome > or = 47 % to indicate reduced disability due to condition.  Goal status: on going 04/10/2022   4.  Patient will demonstrate Rt LE MMT 5/5 throughout to faciltiate usual transfers, stairs, squatting at [facility] for daily life.   Goal status: MET 04/10/2022   5.  Patient will demonstrate bilateral DF in 0 deg knee flexion > 10 to facilitate mobility for normalized ambulation at PLOF.  Goal status: MET 04/10/2022   6.  Patient will demonstrate/report ability to sleep through night s disruption.  Goal status: MET 04/10/2022   7.  Patient will demonstrate bilateral SLS > 10 seconds to facilitate ability to walk on even and uneven surfaces with better control.  Goal Status: on going 04/10/2022   PLAN:  PT FREQUENCY: 1-2x/week  PT DURATION: 10 weeks  PLANNED INTERVENTIONS: Therapeutic exercises, Therapeutic activity, Neuro Muscular re-education, Balance training, Gait training, Patient/Family education, Joint mobilization, Stair training, DME instructions, Dry Needling, Electrical stimulation, Traction, Cryotherapy, vasopneumatic deviceMoist heat, Taping, Ultrasound, Ionotophoresis 91m/ml Dexamethasone, and Manual therapy.  All included unless contraindicated  PLAN FOR NEXT SESSION: Trial HEP for 2 weeks.     MScot Jun PT, DPT, OCS, ATC 04/10/22  10:50 AM       Referring diagnosis? M25.571 (ICD-10-CM) - Pain in right ankle and joints of right foot Treatment diagnosis? (if different than referring diagnosis) M25.571 (ICD-10-CM) - Pain in right ankle and joints of right foot What was this (referring dx) caused by? _0  Surgery _1  Fall _2  Ongoing issue _3  Arthritis _4  Other: ____________  Laterality: _5  Rt _6  Lt _7  Both  Check all possible CPT codes:  *CHOOSE 10 OR LESS*    _8  97110 (Therapeutic Exercise)  _9   92507 (SLP Treatment)  _10  909811(Neuro Re-ed)   _11  92526 (Swallowing Treatment)   _12  991478(Gait Training)   _13  929562(Cognitive Training, 1st 15 minutes) _14  97140 (Manual Therapy)   _15  97130 (Cognitive Training, each add'l 15 minutes)  _16  97164 (Re-evaluation)                              _17  Other, List CPT Code ____________  _18  913086(Therapeutic Activities)     _19  957846(Self Care)   _20  All codes above (97110 - 97535)  _21  97012 (Mechanical Traction)  _22  97014 (E-stim Unattended)  _23  97032 (E-stim manual)  _24  97033 (Ionto)  _25  97035 (Ultrasound) _26  97750 (Physical Performance Training) _27  996295(Aquatic Therapy) _28  97016 (Vasopneumatic Device) _29  928413(Paraffin) _30  97034 (Contrast Bath) _31  97597 (Wound Care 1st 20 sq cm) _32  97598 (Wound  Care each add'l 20 sq cm) _0  97760 (Orthotic Fabrication, Fitting, Training Initial) _1  [id number] (Prosthetic Management and Training Initial) _2  [id number] (Orthotic or Prosthetic Training/ Modification Subsequent)

## 2022-04-15 ENCOUNTER — Encounter: Payer: Medicare HMO | Admitting: Rehabilitative and Restorative Service Providers"

## 2022-04-17 ENCOUNTER — Encounter: Payer: Medicare HMO | Admitting: Rehabilitative and Restorative Service Providers"

## 2022-04-22 ENCOUNTER — Ambulatory Visit (INDEPENDENT_AMBULATORY_CARE_PROVIDER_SITE_OTHER): Payer: Medicare HMO | Admitting: Rehabilitative and Restorative Service Providers"

## 2022-04-22 ENCOUNTER — Encounter: Payer: Self-pay | Admitting: Rehabilitative and Restorative Service Providers"

## 2022-04-22 DIAGNOSIS — R262 Difficulty in walking, not elsewhere classified: Secondary | ICD-10-CM

## 2022-04-22 DIAGNOSIS — M25571 Pain in right ankle and joints of right foot: Secondary | ICD-10-CM | POA: Diagnosis not present

## 2022-04-22 DIAGNOSIS — M6281 Muscle weakness (generalized): Secondary | ICD-10-CM | POA: Diagnosis not present

## 2022-04-22 NOTE — Therapy (Addendum)
OUTPATIENT PHYSICAL THERAPY TREATMENT /DISCHARGE   Patient Name: Caroline [name] MRN: [id number] DOB:07/16/1950, 72 y.o., female Today's Date: 04/22/2022    END OF SESSION:  PT End of Session - 04/22/22 0916     Visit Number 11    Number of Visits 20    Date for PT Re-Evaluation 05/02/22    Authorization Type [employer] $10 copay    Authorization Time Period -05/02/2022    Authorization - Visit Number 11    Authorization - Number of Visits 12    Progress Note Due on Visit 12    PT Start Time 0915    PT Stop Time 0954    PT Time Calculation (min) 39 min    Activity Tolerance Patient tolerated treatment well    Behavior During Therapy WFL for tasks assessed/performed                Past Medical History:  Diagnosis Date   Allergy    Zyrtec, Benadryl   Anxiety    Zoloft since several years   Asthma    Balance problem    Cataract    Complication of anesthesia    GERD (gastroesophageal reflux disease)    Hyperlipidemia    Hypertension    Memory changes    Migraines    topmax and Imitrex   Past Surgical History:  Procedure Laterality Date   ABDOMINAL HYSTERECTOMY  2016   uterine prolapse; ovaries INTACT   BLADDER REPAIR     BREAST BIOPSY     NL   BREAST EXCISIONAL BIOPSY     BREAST SURGERY     breast bx x 2 in 1980s; benign   CARPAL TUNNEL RELEASE Bilateral    CATARACT EXTRACTION, BILATERAL  2018   TOTAL KNEE ARTHROPLASTY Left 10/01/2021   Procedure: LEFT TOTAL KNEE ARTHROPLASTY;  Surgeon: Meredith Pel, MD;  Location: [place];  Service: Orthopedics;  Laterality: Left;   Patient Active Problem List   Diagnosis Date Noted   Arthritis of left knee    Acute encephalopathy/delirium  10/04/2021   S/P total knee arthroplasty, left 10/01/2021   Postcalcaneal bursitis of right foot 03/11/2021   Primary osteoarthritis of both knees 03/11/2021   Chronic pain of both knees 03/11/2021   Asthma due to seasonal allergies 11/17/2019   Osteopenia of multiple sites  10/19/2018   Chronic sinusitis 06/10/2018   Essential hypertension, benign 10/22/2017   Benign hematuria 06/17/2017   Pure hypercholesterolemia 08/11/2016   Anxiety and depression 08/11/2016   Chronic migraine without aura without status migrainosus, not intractable 08/11/2016    PCP: Caren Macadam MD  REFERRING PROVIDER: Meredith Pel, MD  REFERRING DIAG: [phone] (ICD-10-CM) - Pain in right ankle and joints of right foot  THERAPY DIAG:  Pain in right ankle and joints of right foot  Muscle weakness (generalized)  Difficulty in walking, not elsewhere classified  Rationale for Evaluation and Treatment: Rehabilitation  ONSET DATE: April 2023  SUBJECTIVE:   SUBJECTIVE STATEMENT: She stated having a few days of increased trouble with insidious onset but overall still getting better than before.   PERTINENT HISTORY: History of Lt knee replacement  PAIN:  NPRS scale: 0/10 pain upon arrival.  Pain location: Rt posterior ankle Pain description: burns, toothache Aggravating factors: walking/standing, worse with day activity Relieving factors: elevation and ice/rest, Voltaren gel  PRECAUTIONS: None  WEIGHT BEARING RESTRICTIONS: No  FALLS:  Has patient fallen in last 6 months? No  LIVING ENVIRONMENT: Lives in: House/apartment Stairs: 1 step into  without rail  OCCUPATION: Retired  PLOF: Independent , Housework responsibility.  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  Previously did walking for exercise.  Ride stationary bike  PATIENT GOALS: Reduce pain, be able to walk more  Next MD visit:  none scheduled at eval time   OBJECTIVE:   PATIENT SURVEYS:  04/22/2022:  FOTO update 54  03/13/2022:  FOTO update:  42  02/21/2022 FOTO intake: 41   predicted:  47  COGNITION: 02/21/2022 Overall cognitive status: WFL    MUSCLE LENGTH: 02/21/2022 No specific testing  POSTURE:  02/21/2022 unremarkable   PALPATION: 02/21/2022 Tenderness in distal approximate 2 inches of  achilles near insertion Rt ankle.   No specific thickening of tissue in area to touch.   LOWER EXTREMITY ROM:   ROM Right 02/21/2022 Left 02/21/2022 Right 03/05/2022  Hip flexion     Hip extension     Hip abduction     Hip adduction     Hip internal rotation     Hip external rotation     Knee flexion     Knee extension     Ankle dorsiflexion 9 AROM in 90 deg knee flexion / 3 AROM in 0 deg knee flexion 11  AROM in 90 deg knee flexion / 4 AROM in 0 deg knee flexion 21 AROM in 90 deg knee flexion / 10 AROM in 0 deg knee flexion  Ankle plantarflexion [facility]   Ankle inversion     Ankle eversion      (Blank rows = not tested)  LOWER EXTREMITY MMT:  MMT Right 02/21/2022 Left 02/21/2022 Right 03/11/2022 Right 03/27/2022  Hip flexion 5/5 5/5    Hip extension      Hip abduction      Hip adduction      Hip internal rotation      Hip external rotation      Knee flexion 5/5 5/5    Knee extension 5/5 5/5    Ankle dorsiflexion 5/5 5/5    Ankle plantarflexion 3+/5 c pain (2 reps in standing ) 5/5 4/5 15 reps in standing  5/5 20 reps in standing single leg  Ankle inversion 5/5 5/5    Ankle eversion 5/5 5/5     (Blank rows = not tested)  LOWER EXTREMITY SPECIAL TESTS:  02/21/2022 No specific testing today  FUNCTIONAL TESTS:  02/21/2022 18 inch chair transfer: independent s UE on first try Lt SLS: < 3 seconds prior to loss of balance Rt SLS:< 3 seconds prior to loss of balance c pain noted  GAIT: 03/24/2022:  normalized independent ambulation in clinic.   02/21/2022 Independent ambulation c decreased toe off progress Rt leg resulting in decreased stance on Rt and decreased step length c Lt foot   TODAY'S TREATMENT                                                                          DATE: 04/22/2022 Therex: Recumbent bike lvl 2 6 mins, seat 3  Double leg up, Rt leg slowly lowering eccentric PF control 3 x 10 on incline board Gastroc stretch 30 sec x 5 runner stretch Rt  leg posterior ,  soleus bent knee stretch 30 sec x 5  Review of existing  HEP.   Ionto Dexamethasone 4-6 hour patch 1.0 mL to posterior Rt ankle  TODAY'S TREATMENT                                                                          DATE: 04/10/2022 Therex: Recumbent bike lvl 2 6 mins, seat 3  Verbal review of HEP.   Manual therapy c percussive device to Rt calf  Ionto Dexamethasone 4-6 hour patch 1.0 mL to posterior Rt ankle  TODAY'S TREATMENT                                                                          DATE: 03/27/2022 Therex: Recumbent bike lvl 3 6 mins, seat 3  Slantboard stretch 30 sec X 5 gastroc Double leg PF c eccentric Rt leg lowering 3 x 10 on incline board  Manual therapy c percussive device to Rt calf   Trigger Point Dry-Needling  Treatment instructions: Expect mild to moderate muscle soreness. Patient Consent Given: Yes Muscles treated: Rt gastroc-solus  Treatment response/outcome: good overall tolerance,twitch response noted   Ionto Dexamethasone 4-6 hour patch 1.0 mL to posterior Rt ankle   PATIENT EDUCATION:  02/21/2022 Education details: HEP, POC Person educated: Patient Education method: Consulting civil engineer, Media planner, Verbal cues, and Handouts Education comprehension: verbalized understanding, returned demonstration, and verbal cues required  HOME EXERCISE PROGRAM: Access Code: [facility] URL: [email or web address] Date: 03/05/2022 Prepared by: Scot Jun  Exercises - Gastroc Stretch on Wall  - 2-3 x daily - 7 x weekly - 1 sets - 3-5 reps - 30 hold - Standing Bilateral Gastroc Stretch with Step  - 2-3 x daily - 7 x weekly - 1 sets - 3-5 reps - 30 hold - Ankle Plantar Flexion with Resistance  - 2 x daily - 7 x weekly - 3 sets - 10-15 reps - Standing Heel Raise with Support  - 2 x daily - 7 x weekly - 3 sets - 10 reps  ASSESSMENT:  CLINICAL IMPRESSION: At this time, Pt was appropriate for trial HEP over next 30 days  to continue to address complaints of symptoms to reduce frequency and severity to improve functional activity.  FOTO reassessment was improved today compared to last assessment.    OBJECTIVE IMPAIRMENTS: Abnormal gait, decreased activity tolerance, decreased balance, decreased coordination, decreased endurance, decreased mobility, difficulty walking, decreased ROM, decreased strength, hypomobility, impaired perceived functional ability, impaired flexibility, improper body mechanics, and pain.   ACTIVITY LIMITATIONS: standing, squatting, sleeping, stairs, transfers, and locomotion level  PARTICIPATION LIMITATIONS: cleaning, laundry, interpersonal relationship, shopping, community activity, and exercise  PERSONAL FACTORS: Time since onset of injury/illness/exacerbation are also affecting patient's functional outcome.   REHAB POTENTIAL: Good  CLINICAL DECISION MAKING: Stable/uncomplicated  EVALUATION COMPLEXITY: Low   GOALS: Goals reviewed with patient? Yes  SHORT TERM GOALS: (target date for Short term goals are 3 weeks 03/14/2022)   1.  Patient will demonstrate independent use of home exercise program to maintain progress from in  clinic treatments.  Goal status: Met 03/11/2022  LONG TERM GOALS: (target dates for all long term goals are 10 weeks  05/02/2022 )   1. Patient will demonstrate/report pain at worst less than or equal to 2/10 to facilitate minimal limitation in daily activity secondary to pain symptoms.  Goal status: on going 04/10/2022   2. Patient will demonstrate independent use of home exercise program to facilitate ability to maintain/progress functional gains from skilled physical therapy services.  Goal status: MET 04/10/2022   3. Patient will demonstrate FOTO outcome > or = 47 % to indicate reduced disability due to condition.  Goal status: Met 04/22/2022   4.  Patient will demonstrate Rt LE MMT 5/5 throughout to faciltiate usual transfers, stairs, squatting at [facility]  for daily life.   Goal status: MET 04/10/2022   5.  Patient will demonstrate bilateral DF in 0 deg knee flexion > 10 to facilitate mobility for normalized ambulation at PLOF.  Goal status: MET 04/10/2022   6.  Patient will demonstrate/report ability to sleep through night s disruption.  Goal status: MET 04/10/2022   7.  Patient will demonstrate bilateral SLS > 10 seconds to facilitate ability to walk on even and uneven surfaces with better control.  Goal Status: on going 04/10/2022   PLAN:  PT FREQUENCY: 1-2x/week  PT DURATION: 10 weeks  PLANNED INTERVENTIONS: Therapeutic exercises, Therapeutic activity, Neuro Muscular re-education, Balance training, Gait training, Patient/Family education, Joint mobilization, Stair training, DME instructions, Dry Needling, Electrical stimulation, Traction, Cryotherapy, vasopneumatic deviceMoist heat, Taping, Ultrasound, Ionotophoresis '4mg'$ /ml Dexamethasone, and Manual therapy.  All included unless contraindicated  PLAN FOR NEXT SESSION: Trial HEP, discharge after 30 days inactivity.  [employer]/recert if returning in future    Scot Jun, PT, DPT, OCS, ATC 04/22/22  10:00 AM   PHYSICAL THERAPY DISCHARGE SUMMARY  Visits from Start of Care: 11  Current functional level related to goals / functional outcomes: See note   Remaining deficits: See note   Education / Equipment: HEP  Patient goals were  mostly met . Patient is being discharged due to not returning since the last visit. Scot Jun, PT, DPT, OCS, ATC 06/11/22  11:08 AM

## 2022-04-29 ENCOUNTER — Encounter: Payer: Medicare HMO | Admitting: Rehabilitative and Restorative Service Providers"

## 2022-07-24 ENCOUNTER — Telehealth: Payer: Self-pay | Admitting: Orthopedic Surgery

## 2022-07-24 NOTE — Telephone Encounter (Signed)
Patient Had knee replacement in 2023 and is flying this weekend and would like a note emailed to her stating she had surgery and she has metal in her body, please advise

## 2022-07-24 NOTE — Telephone Encounter (Signed)
Note done. Tried calling patient to advise, went straight to VM. She should be able to access via [employer]

## 2022-08-08 ENCOUNTER — Other Ambulatory Visit: Payer: Self-pay | Admitting: Family Medicine

## 2022-08-08 DIAGNOSIS — Z1231 Encounter for screening mammogram for malignant neoplasm of breast: Secondary | ICD-10-CM

## 2022-08-22 ENCOUNTER — Encounter: Payer: Self-pay | Admitting: Sports Medicine

## 2022-08-22 ENCOUNTER — Other Ambulatory Visit (INDEPENDENT_AMBULATORY_CARE_PROVIDER_SITE_OTHER): Payer: Medicare HMO

## 2022-08-22 ENCOUNTER — Ambulatory Visit (INDEPENDENT_AMBULATORY_CARE_PROVIDER_SITE_OTHER): Payer: Medicare HMO | Admitting: Sports Medicine

## 2022-08-22 DIAGNOSIS — M5442 Lumbago with sciatica, left side: Secondary | ICD-10-CM

## 2022-08-22 DIAGNOSIS — G8929 Other chronic pain: Secondary | ICD-10-CM

## 2022-08-22 DIAGNOSIS — M5136 Other intervertebral disc degeneration, lumbar region: Secondary | ICD-10-CM | POA: Diagnosis not present

## 2022-08-22 DIAGNOSIS — M4186 Other forms of scoliosis, lumbar region: Secondary | ICD-10-CM

## 2022-08-22 DIAGNOSIS — M5441 Lumbago with sciatica, right side: Secondary | ICD-10-CM | POA: Diagnosis not present

## 2022-08-22 DIAGNOSIS — M533 Sacrococcygeal disorders, not elsewhere classified: Secondary | ICD-10-CM | POA: Diagnosis not present

## 2022-08-22 MED ORDER — METHYLPREDNISOLONE 4 MG PO TBPK: ORAL_TABLET | ORAL | 0 refills | Status: DC

## 2022-08-22 NOTE — Progress Notes (Signed)
Months of low back pain with bilateral radic. R>L No change since pain started Tylenol and ibuprofen for pain which helps some No known injury

## 2022-08-22 NOTE — Progress Notes (Signed)
Caroline [name] - 72 y.o. female MRN [id number]  Date of birth: 07/28/1950  Office Visit Note: Visit Date: 08/22/2022 PCP: Aliene Beams, MD Referred by: Aliene Beams, MD  Subjective: Chief Complaint  Patient presents with   Lower Back - Pain   HPI: Caroline [name] is a pleasant 72 y.o. female who presents today for chronic low back pain with R > L radiculopathy.  She has had quite a few months low back pain with radicular symptoms down the posterior right leg, occasionally the left leg.  She did previously have a left knee TKA back in June 2023 and did have some compensation on the right side as she had been dealing with an Achilles injury.  She completed PT for this in January 2024.  Pain is localized over the low back more so on the right side.  She denies any numbness tingling or weakness but does note that the pain will radiate down the right leg. This is worse with prolonged standing, she will have to stop when she is walking longer periods of time around the grocery store and stretch the right leg for having to resume.  She is taking ibuprofen, Tylenol and doing some stretching with only mild relief.  Pertinent ROS were reviewed with the patient and found to be negative unless otherwise specified above in HPI.   Assessment & Plan: Visit Diagnoses:  1. Chronic bilateral low back pain with right-sided sciatica   2. DDD (degenerative disc disease), lumbar   3. Dextroscoliosis of lumbar spine   4. Sacroiliac joint dysfunction of right side    Plan: Discussed with and the nature of her low back pain with intermittent right radiculopathy.  She does have advanced degenerative disc disease most notable at the L4-S1 level with associated L5-S1 anterior listhesis.  I do think some of her pain is emanating from this arthritic change, however she is having some radicular symptoms of the right leg, most notably in the S1 nerve root distribution.  Fortunately she does not have any weakness or  associated myelopathy symptoms.  She has been dealing with an exacerbation of this chronic condition however.  Discussed treatment options such as scheduled anti-inflammatories versus corticosteroid course, formalized physical therapy, advanced imaging, SI joint injection.  We will start her on a 6-day methylprednisolone taper course to see if this improves her pain and radicular symptoms.  She may then resume ibuprofen and/or Tylenol as needed.  She is interested in formal therapy, we will send this referral today.  I would like to see her back in about 6 weeks for reevaluation.  Next steps may include MRI of the lumbar spine if her pain is not improving or still having radicular symptoms.  Follow-up: Return in about 6 weeks (around 10/03/2022) for for low back pain.   Meds & Orders:  Meds ordered this encounter  Medications   methylPREDNISolone (MEDROL DOSEPAK) 4 MG TBPK tablet    Sig: Take per packet instructions. Taper dosing.    Dispense:  1 each    Refill:  0    Orders Placed This Encounter  Procedures   XR Lumbar Spine Complete   Ambulatory referral to Physical Therapy     Procedures: No procedures performed      Clinical History: No specialty comments available.  She reports that she quit smoking about 54 years ago. Her smoking use included cigarettes. She has never used smokeless tobacco. No results for input(s): "HGBA1C", "LABURIC" in the last 8760 hours.  Objective:  Vital Signs: There were no vitals taken for this visit.  Physical Exam  Gen: Well-appearing, in no acute distress; non-toxic CV:  Well-perfused. Warm.  Resp: Breathing unlabored on room air; no wheezing. Psych: Fluid speech in conversation; appropriate affect; normal thought process Neuro: Sensation intact throughout. No gross coordination deficits.   Ortho Exam - Lumbar spine: There is a mild scoliosis, dextroscoliosis of the lumbar curve.  No overlying skin changes or tenderness to palpation over the  spinous processes.  There is right-sided lumbar paraspinal hypertonicity and TTP overlying the right SI joint.  Full range of motion with flexion and extension, mild pain at endrange extension.  There is 5/5 strength of bilateral lower extremities in the L4-S1 nerve root distribution.  2+ L4, 1+ S1 reflexes bilaterally.  Negative straight leg raise.  Imaging: XR Lumbar Spine Complete  Result Date: 08/22/2022 Complete view of the lumbar spine including AP, lateral and flexion/extension views were ordered and reviewed by myself.  X-rays demonstrate a mild dextroscoliotic curve of the lumbar spine.  There is advanced DDD of the lower spine lumbar spine from L3-S1.  There is significant intervertebral disc height loss with associated arthritic change that is severe at the L4-L5 level and moderate at the L5-S1 and L3-S4 level.  There is a near grade 2 anterior listhesis of L5 on S1.   Narrative & Impression  CLINICAL DATA:  Right ankle pain along the medial and lateral aspect for 6 months   EXAM: MRI OF THE RIGHT ANKLE WITHOUT CONTRAST   TECHNIQUE: Multiplanar, multisequence MR imaging of the ankle was performed. No intravenous contrast was administered.   COMPARISON:  None Available.   FINDINGS: TENDONS   Peroneal: Peroneal longus tendon intact. Peroneal brevis intact.   Posteromedial: Posterior tibial tendon intact. Flexor hallucis longus tendon intact. Flexor digitorum longus tendon intact.   Anterior: Tibialis anterior tendon intact. Extensor hallucis longus tendon intact Extensor digitorum longus tendon intact.   Achilles: Moderate tendinosis of the Achilles tendon with a small partial-thickness tear along the superior insertion. Mild subcortical marrow edema in the posterior calcaneus at the Achilles insertion. Small amount of fluid in the retrocalcaneal bursa.   Plantar Fascia: Intact.   LIGAMENTS   Lateral: Anterior talofibular ligament intact. Calcaneofibular ligament  intact. Posterior talofibular ligament intact. Anterior and posterior tibiofibular ligaments intact.   Medial: Deltoid ligament intact. Spring ligament intact.   CARTILAGE   Ankle Joint: No joint effusion. Normal ankle mortise. No chondral defect.   Subtalar Joints/Sinus Tarsi: Normal subtalar joints. No subtalar joint effusion. Normal sinus tarsi.   Bones: No aggressive osseous lesion. No fracture or dislocation.   Soft Tissue: No fluid collection or hematoma. Muscles are normal without edema or atrophy. Tarsal tunnel is normal.   IMPRESSION: Moderate tendinosis of the Achilles tendon with a small partial-thickness tear along the superior insertion. Mild subcortical marrow edema in the posterior calcaneus at the Achilles insertion. Mild retrocalcaneal bursitis.     Electronically Signed   By: Elige Ko M.D.   On: 01/13/2022 16:35    Past Medical/Family/Surgical/Social History: Medications & Allergies reviewed per EMR, new medications updated. Patient Active Problem List   Diagnosis Date Noted   Arthritis of left knee    Acute encephalopathy/delirium  10/04/2021   S/P total knee arthroplasty, left 10/01/2021   Postcalcaneal bursitis of right foot 03/11/2021   Primary osteoarthritis of both knees 03/11/2021   Chronic pain of both knees 03/11/2021   Asthma due to seasonal allergies 11/17/2019  Osteopenia of multiple sites 10/19/2018   Chronic sinusitis 06/10/2018   Essential hypertension, benign 10/22/2017   Benign hematuria 06/17/2017   Pure hypercholesterolemia 08/11/2016   Anxiety and depression 08/11/2016   Chronic migraine without aura without status migrainosus, not intractable 08/11/2016   Past Medical History:  Diagnosis Date   Allergy    Zyrtec, Benadryl   Anxiety    Zoloft since several years   Asthma    Balance problem    Cataract    Complication of anesthesia    GERD (gastroesophageal reflux disease)    Hyperlipidemia    Hypertension     Memory changes    Migraines    topmax and Imitrex   Family History  Problem Relation Age of Onset   Hypertension Mother    Stroke Mother    Dementia Mother    Cancer Father 1       oral cancer   Heart disease Father        PAD/femoral bypass   Hypertension Father    Hyperlipidemia Father    Alcohol abuse Father    Hyperlipidemia Sister    Hypertension Sister    Breast cancer Maternal Aunt    Past Surgical History:  Procedure Laterality Date   ABDOMINAL HYSTERECTOMY  2016   uterine prolapse; ovaries INTACT   BLADDER REPAIR     BREAST BIOPSY     NL   BREAST EXCISIONAL BIOPSY     BREAST SURGERY     breast bx x 2 in 1980s; benign   CARPAL TUNNEL RELEASE Bilateral    CATARACT EXTRACTION, BILATERAL  2018   TOTAL KNEE ARTHROPLASTY Left 10/01/2021   Procedure: LEFT TOTAL KNEE ARTHROPLASTY;  Surgeon: Cammy Copa, MD;  Location: [facility] OR;  Service: Orthopedics;  Laterality: Left;   Social History   Occupational History   Occupation: employed  Tobacco Use   Smoking status: Former    Types: Cigarettes    Quit date: 04/07/1968    Years since quitting: 54.4   Smokeless tobacco: Never  Vaping Use   Vaping Use: Never used  Substance and Sexual Activity   Alcohol use: Yes    Comment: rarely wine   Drug use: No   Sexual activity: Yes    Birth control/protection: Post-menopausal, Surgical

## 2022-09-08 NOTE — Therapy (Signed)
OUTPATIENT PHYSICAL THERAPY EVALUATION   Patient Name: Caroline [name] MRN: [id number] DOB:27-Jul-1950, 72 y.o., female Today's Date: 09/09/2022  END OF SESSION:  PT End of Session - 09/09/22 1106     Visit Number 1    Number of Visits 24    Date for PT Re-Evaluation 12/02/22    Authorization Type [employer] $25 copay    Authorization - Visit Number 1    Authorization - Number of Visits 12    PT Start Time 1107    PT Stop Time 1132    PT Time Calculation (min) 25 min    Activity Tolerance Patient tolerated treatment well    Behavior During Therapy WFL for tasks assessed/performed             Past Medical History:  Diagnosis Date   Allergy    Zyrtec, Benadryl   Anxiety    Zoloft since several years   Asthma    Balance problem    Cataract    Complication of anesthesia    GERD (gastroesophageal reflux disease)    Hyperlipidemia    Hypertension    Memory changes    Migraines    topmax and Imitrex   Past Surgical History:  Procedure Laterality Date   ABDOMINAL HYSTERECTOMY  2016   uterine prolapse; ovaries INTACT   BLADDER REPAIR     BREAST BIOPSY     NL   BREAST EXCISIONAL BIOPSY     BREAST SURGERY     breast bx x 2 in 1980s; benign   CARPAL TUNNEL RELEASE Bilateral    CATARACT EXTRACTION, BILATERAL  2018   TOTAL KNEE ARTHROPLASTY Left 10/01/2021   Procedure: LEFT TOTAL KNEE ARTHROPLASTY;  Surgeon: Cammy Copa, MD;  Location: [facility] OR;  Service: Orthopedics;  Laterality: Left;   Patient Active Problem List   Diagnosis Date Noted   Arthritis of left knee    Acute encephalopathy/delirium  10/04/2021   S/P total knee arthroplasty, left 10/01/2021   Postcalcaneal bursitis of right foot 03/11/2021   Primary osteoarthritis of both knees 03/11/2021   Chronic pain of both knees 03/11/2021   Asthma due to seasonal allergies 11/17/2019   Osteopenia of multiple sites 10/19/2018   Chronic sinusitis 06/10/2018   Essential hypertension, benign 10/22/2017   Benign  hematuria 06/17/2017   Pure hypercholesterolemia 08/11/2016   Anxiety and depression 08/11/2016   Chronic migraine without aura without status migrainosus, not intractable 08/11/2016    PCP: Aliene Beams MD  REFERRING PROVIDER: Madelyn Brunner, DO  REFERRING DIAG: M54.42,M54.41,G89.29 (ICD-10-CM) - Chronic bilateral low back pain with bilateral sciatica  Rationale for Evaluation and Treatment: Rehabilitation  THERAPY DIAG:  Other low back pain  Pain in right leg  Difficulty in walking, not elsewhere classified  ONSET DATE: Several months approx. March 2024.   SUBJECTIVE:  SUBJECTIVE STATEMENT: Caroline [name] reported having complaints of Rt side low back and Rt hip/thigh symptoms.  Saw Dr Shon Baton and had xray indicated some scoliosis.  Reported complaints with walking/standing prolonged.  Insidious onset.   She indicated no trouble sleeping due to symptoms.   Reported able to continue activity just with symptoms.   Denied numbness and tingling.    PERTINENT HISTORY:  History of Rt lower leg/foot pain with therapy services within last year.  History of Lt knee replacement   PAIN:  NPRS scale: at worst 10/10 Pain location: back , Rt leg Pain description: severe ache Aggravating factors: walking, standing prolonged Relieving factors: stretching, tylenol as needed  PRECAUTIONS: None  WEIGHT BEARING RESTRICTIONS: No  FALLS:  Has patient fallen in last 6 months? 1 rolled out of bed.   LIVING ENVIRONMENT: Lives in: House/apartment Stairs: 1 step into without rail  OCCUPATION: Retired   PLOF:  Independent , Engineer, site.  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  Previously did walking for exercise.  Ride stationary bike   PATIENT GOALS: Reduce pain, be able to walk more    OBJECTIVE:   PATIENT  SURVEYS:  09/09/2022 FOTO eval:  69  predicted:  72  SCREENING FOR RED FLAGS: 09/09/2022 Bowel or bladder incontinence: No Cauda equina syndrome: No  COGNITION: 09/09/2022 Overall cognitive status: WFL normal      SENSATION: 09/09/2022 No limitations  MUSCLE LENGTH: 09/09/2022 Passive SLR Lt 90 deg, Rt 80 deg  POSTURE:  09/09/2022  Mild increased thoracic kyphosis.  Equal iliac crest in standing noted.   No lateral shift noted.   PALPATION: 09/09/2022 No specific tenderness to touch.   LUMBAR ROM:   09/09/2022: no centralization or peripheralization noted.   AROM 09/09/2022  Flexion Movement to mid shin without complaints.   Extension 75  % WFL without complaints  Right lateral flexion Head of fibula without complaint c pulling Rt hip  Left lateral flexion Head of fibula without complaint  Right rotation   Left rotation    (Blank rows = not tested)  LOWER EXTREMITY ROM:      Right 09/09/2022 Left 09/09/2022  Hip flexion    Hip extension    Hip abduction    Hip adduction    Hip internal rotation    Hip external rotation    Knee flexion    Knee extension    Ankle dorsiflexion    Ankle plantarflexion    Ankle inversion    Ankle eversion     (Blank rows = not tested)  LOWER EXTREMITY MMT:    MMT Right 09/09/2022 Left 09/09/2022  Hip flexion 5/5 5/5  Hip extension    Hip abduction    Hip adduction    Hip internal rotation    Hip external rotation    Knee flexion 5/5 5/5  Knee extension 5/5 5/5  Ankle dorsiflexion    Ankle plantarflexion    Ankle inversion    Ankle eversion     (Blank rows = not tested)  LUMBAR SPECIAL TESTS:  09/09/2022 (-) slump bilateral, (-) crossed slr bilateral  FUNCTIONAL TESTS:  09/09/2022 18 inch chair transfer s UE assist s symptoms.   GAIT: 09/09/2022 Independent   TODAY'S TREATMENT:  DATE: 09/09/2022  Therex:    HEP instruction/performance c cues for techniques,  handout provided.  Trial set performed of each for comprehension and symptom assessment.  See below for exercise list  PATIENT EDUCATION:  09/09/2022 Education details: HEP, POC Person educated: Patient Education method: Explanation, Demonstration, Verbal cues, and Handouts Education comprehension: verbalized understanding, returned demonstration, and verbal cues required  HOME EXERCISE PROGRAM: Access Code: [email or web address] URL: [email or web address] Date: 09/09/2022 Prepared by: Chyrel Masson  Exercises - Supine Lower Trunk Rotation  - 2-3 x daily - 7 x weekly - 1 sets - 3-5 reps - 15 hold - Standing Lumbar Extension with Counter  - 2-3 x daily - 7 x weekly - 1 sets - 5-10 reps - Supine Bridge  - 1-2 x daily - 7 x weekly - 1-2 sets - 10 reps - 2 hold - Prone Alternating Arm and Leg Lifts  - 1-2 x daily - 7 x weekly - 1-2 sets - 10 reps - 3 hold  ASSESSMENT:  CLINICAL IMPRESSION: Patient is a 72 y.o. who comes to clinic with complaints of low back, Rt leg pain with mobility, strength and movement coordination deficits that impair their ability to perform usual daily and recreational functional activities without increase difficulty/symptoms at this time.  Patient to benefit from skilled PT services to address impairments and limitations to improve to previous level of function without restriction secondary to condition.   OBJECTIVE IMPAIRMENTS: decreased activity tolerance, decreased endurance, decreased mobility, difficulty walking, decreased ROM, impaired perceived functional ability, increased muscle spasms, impaired flexibility, improper body mechanics, and pain.   ACTIVITY LIMITATIONS: carrying, lifting, bending, standing, stairs, and locomotion level  PARTICIPATION LIMITATIONS: cleaning, interpersonal relationship, shopping, and community activity  PERSONAL FACTORS:  no specific limitations  are also affecting patient's functional outcome.   REHAB POTENTIAL:  Good  CLINICAL DECISION MAKING: Stable/uncomplicated  EVALUATION COMPLEXITY: Low   GOALS: Goals reviewed with patient? Yes  SHORT TERM GOALS: (target date for Short term goals are 3 weeks 09/30/2022)  1. Patient will demonstrate independent use of home exercise program to maintain progress from in clinic treatments.  Goal status: New  LONG TERM GOALS: (target dates for all long term goals are 12 weeks  12/02/2022 )   1. Patient will demonstrate/report pain at worst less than or equal to 2/10 to facilitate minimal limitation in daily activity secondary to pain symptoms.  Goal status: New   2. Patient will demonstrate independent use of home exercise program to facilitate ability to maintain/progress functional gains from skilled physical therapy services.  Goal status: New   3. Patient will demonstrate FOTO outcome > or = 72 % to indicate reduced disability due to condition.  Goal status: New   4. Patient will demonstrate lumbar extension 100 % WFL s symptoms to facilitate upright standing, walking posture at PLOF s limitation.  Goal status: New   5.  Patient will demonstrate/report walking distances unrestricted due to symptoms.   Goal status: New    PLAN:  PT FREQUENCY: 1-2x/week  PT DURATION: 12 weeks  PLANNED INTERVENTIONS: Therapeutic exercises, Therapeutic activity, Neuro Muscular re-education, Balance training, Gait training, Patient/Family education, Joint mobilization, Stair training, DME instructions, Dry Needling, Electrical stimulation, Cryotherapy, vasopneumatic device, Moist heat, Taping, Traction Ultrasound, Ionotophoresis 4mg /ml Dexamethasone, and aquatic therapy, Manual therapy.  All included unless contraindicated  PLAN FOR NEXT SESSION: Review HEP knowledge/results.   Core stability additions.   Chyrel Masson, PT, DPT, OCS, ATC 09/09/22  11:36 AM  Referring diagnosis? M54.42,M54.41,G89.29 (ICD-10-CM) - Chronic bilateral low back pain with  bilateral sciatica Treatment diagnosis? (if different than referring diagnosis) M54.59 What was this (referring dx) caused by? []  Surgery []  Fall [x]  Ongoing issue []  Arthritis []  Other: ____________  Laterality: [x]  Rt []  Lt []  Both  Check all possible CPT codes:  *CHOOSE 10 OR LESS*    []  97110 (Therapeutic Exercise)  []  92507 (SLP Treatment)  []  97112 (Neuro Re-ed)   []  92526 (Swallowing Treatment)   []  97116 (Gait Training)   []  [id number] (Cognitive Training, 1st 15 minutes) []  97140 (Manual Therapy)   []  97130 (Cognitive Training, each add'l 15 minutes)  []  97164 (Re-evaluation)                              []  Other, List CPT Code ____________  []  97530 (Therapeutic Activities)     []  97535 (Self Care)   [x]  All codes above (97110 - 97535)  [x]  97012 (Mechanical Traction)  [x]  97014 (E-stim Unattended)  []  62130 (E-stim manual)  []  97033 (Ionto)  []  97035 (Ultrasound) [x]  97750 (Physical Performance Training) []  [id number] (Aquatic Therapy) []  97016 (Vasopneumatic Device) []  [id number] (Paraffin) []  97034 (Contrast Bath) []  97597 (Wound Care 1st 20 sq cm) []  97598 (Wound Care each add'l 20 sq cm) []  97760 (Orthotic Fabrication, Fitting, Training Initial) []  [id number] (Prosthetic Management and Training Initial) []  [id number] (Orthotic or Prosthetic Training/ Modification Subsequent)

## 2022-09-09 ENCOUNTER — Ambulatory Visit: Payer: Medicare HMO | Admitting: Rehabilitative and Restorative Service Providers"

## 2022-09-09 ENCOUNTER — Other Ambulatory Visit: Payer: Self-pay

## 2022-09-09 ENCOUNTER — Encounter: Payer: Self-pay | Admitting: Rehabilitative and Restorative Service Providers"

## 2022-09-09 DIAGNOSIS — R262 Difficulty in walking, not elsewhere classified: Secondary | ICD-10-CM

## 2022-09-09 DIAGNOSIS — M5459 Other low back pain: Secondary | ICD-10-CM | POA: Diagnosis not present

## 2022-09-09 DIAGNOSIS — M79604 Pain in right leg: Secondary | ICD-10-CM | POA: Diagnosis not present

## 2022-09-12 ENCOUNTER — Ambulatory Visit
Admission: RE | Admit: 2022-09-12 | Discharge: 2022-09-12 | Disposition: A | Discharge status: Home / Self Care | Payer: Medicare HMO | Source: Ambulatory Visit | Attending: Family Medicine | Admitting: Family Medicine

## 2022-09-12 DIAGNOSIS — Z1231 Encounter for screening mammogram for malignant neoplasm of breast: Secondary | ICD-10-CM

## 2022-09-17 ENCOUNTER — Ambulatory Visit: Payer: Medicare HMO | Admitting: Rehabilitative and Restorative Service Providers"

## 2022-09-17 ENCOUNTER — Encounter: Payer: Self-pay | Admitting: Rehabilitative and Restorative Service Providers"

## 2022-09-17 DIAGNOSIS — M79604 Pain in right leg: Secondary | ICD-10-CM

## 2022-09-17 DIAGNOSIS — R262 Difficulty in walking, not elsewhere classified: Secondary | ICD-10-CM | POA: Diagnosis not present

## 2022-09-17 DIAGNOSIS — M5459 Other low back pain: Secondary | ICD-10-CM

## 2022-09-17 NOTE — Therapy (Signed)
OUTPATIENT PHYSICAL THERAPY TREATMENT   Patient Name: Caroline [name] MRN: [id number] DOB:[birth year], 72 y.o., female Today's Date: 09/17/2022  END OF SESSION:  PT End of Session - 09/17/22 0840     Visit Number 2    Number of Visits 24    Date for PT Re-Evaluation 12/02/22    Authorization Type [employer] $25 copay    Authorization - Visit Number 2    Authorization - Number of Visits 12    PT Start Time 0840    PT Stop Time 0919    PT Time Calculation (min) 39 min    Activity Tolerance Patient tolerated treatment well    Behavior During Therapy WFL for tasks assessed/performed              Past Medical History:  Diagnosis Date   Allergy    Zyrtec, Benadryl   Anxiety    Zoloft since several years   Asthma    Balance problem    Cataract    Complication of anesthesia    GERD (gastroesophageal reflux disease)    Hyperlipidemia    Hypertension    Memory changes    Migraines    topmax and Imitrex   Past Surgical History:  Procedure Laterality Date   ABDOMINAL HYSTERECTOMY  2016   uterine prolapse; ovaries INTACT   BLADDER REPAIR     BREAST BIOPSY     NL   BREAST EXCISIONAL BIOPSY     BREAST SURGERY     breast bx x 2 in 1980s; benign   CARPAL TUNNEL RELEASE Bilateral    CATARACT EXTRACTION, BILATERAL  2018   TOTAL KNEE ARTHROPLASTY Left 10/01/2021   Procedure: LEFT TOTAL KNEE ARTHROPLASTY;  Surgeon: Cammy Copa, MD;  Location: [facility] OR;  Service: Orthopedics;  Laterality: Left;   Patient Active Problem List   Diagnosis Date Noted   Arthritis of left knee    Acute encephalopathy/delirium  10/04/2021   S/P total knee arthroplasty, left 10/01/2021   Postcalcaneal bursitis of right foot 03/11/2021   Primary osteoarthritis of both knees 03/11/2021   Chronic pain of both knees 03/11/2021   Asthma due to seasonal allergies 11/17/2019   Osteopenia of multiple sites 10/19/2018   Chronic sinusitis 06/10/2018   Essential hypertension, benign 10/22/2017   Benign  hematuria 06/17/2017   Pure hypercholesterolemia 08/11/2016   Anxiety and depression 08/11/2016   Chronic migraine without aura without status migrainosus, not intractable 08/11/2016    PCP: Aliene Beams MD  REFERRING PROVIDER: Madelyn Brunner, DO  REFERRING DIAG: M54.42,M54.41,G89.29 (ICD-10-CM) - Chronic bilateral low back pain with bilateral sciatica  Rationale for Evaluation and Treatment: Rehabilitation  THERAPY DIAG:  Other low back pain  Pain in right leg  Difficulty in walking, not elsewhere classified  ONSET DATE: Several months approx. March 2024.   SUBJECTIVE:  SUBJECTIVE STATEMENT: She reported having some improvements at times.  Felt like she could do the HEP and reduce symptoms when it was hurting.   PERTINENT HISTORY:  History of Rt lower leg/foot pain with therapy services within last year.  History of Lt knee replacement   PAIN:  NPRS scale:  up to 8/10 from weekend.  Pain location: back , Rt leg Pain description: severe ache Aggravating factors: walking, standing prolonged Relieving factors: stretching, tylenol as needed  PRECAUTIONS: None  WEIGHT BEARING RESTRICTIONS: No  FALLS:  Has patient fallen in last 6 months? 1 rolled out of bed.   LIVING ENVIRONMENT: Lives in: House/apartment Stairs: 1 step into without rail  OCCUPATION: Retired   PLOF:  Independent , Engineer, site.  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  Previously did walking for exercise.  Ride stationary bike   PATIENT GOALS: Reduce pain, be able to walk more    OBJECTIVE:   PATIENT SURVEYS:  09/09/2022 FOTO eval:  69  predicted:  72  SCREENING FOR RED FLAGS: 09/09/2022 Bowel or bladder incontinence: No Cauda equina syndrome: No  COGNITION: 09/09/2022 Overall cognitive status: WFL  normal      SENSATION: 09/09/2022 No limitations  MUSCLE LENGTH: 09/09/2022 Passive SLR Lt 90 deg, Rt 80 deg  POSTURE:  09/09/2022  Mild increased thoracic kyphosis.  Equal iliac crest in standing noted.   No lateral shift noted.   PALPATION: 09/09/2022 No specific tenderness to touch.   LUMBAR ROM:   09/09/2022: no centralization or peripheralization noted.   AROM 09/09/2022 09/17/2022  Flexion Movement to mid shin without complaints.    Extension 75  % WFL without complaints 100 % WFL without complaints  Right lateral flexion Head of fibula without complaint c pulling Rt hip Head of fibula without complaint  Left lateral flexion Head of fibula without complaint Head of fibula without complaint  Right rotation    Left rotation     (Blank rows = not tested)  LOWER EXTREMITY ROM:      Right 09/09/2022 Left 09/09/2022  Hip flexion    Hip extension    Hip abduction    Hip adduction    Hip internal rotation    Hip external rotation    Knee flexion    Knee extension    Ankle dorsiflexion    Ankle plantarflexion    Ankle inversion    Ankle eversion     (Blank rows = not tested)  LOWER EXTREMITY MMT:    MMT Right 09/09/2022 Left 09/09/2022  Hip flexion 5/5 5/5  Hip extension    Hip abduction    Hip adduction    Hip internal rotation    Hip external rotation    Knee flexion 5/5 5/5  Knee extension 5/5 5/5  Ankle dorsiflexion    Ankle plantarflexion    Ankle inversion    Ankle eversion     (Blank rows = not tested)  LUMBAR SPECIAL TESTS:  09/09/2022 (-) slump bilateral, (-) crossed slr bilateral  FUNCTIONAL TESTS:  09/09/2022 18 inch chair transfer s UE assist s symptoms.   GAIT: 09/09/2022 Independent                       TODAY'S TREATMENT:  DATE: 09/17/2022  Therex: Nustep Lvl 6 UE/LE 10 mins for aerobic exercise/mobility Review of existing HEP verbally Supine bridge c green band hip  abduction hold 2 x 10 Supine clam shell green band c isometric hold contralateral leg x 20 bilateral Supine pball press down 10 sec hold x 10  Supine figure 4 push away stretch 15 sec x 3 bilateral Supine figure 4 pull towards stretch 15 sec x 3 bilateral Seated UE lift into table multifidi activation 10 sec hold x 10    TODAY'S TREATMENT:                                                                             DATE: 09/09/2022  Therex:    HEP instruction/performance c cues for techniques, handout provided.  Trial set performed of each for comprehension and symptom assessment.  See below for exercise list  PATIENT EDUCATION:  09/17/2022 Education details: HEP update Person educated: Patient Education method: Explanation, Demonstration, Verbal cues, and Handouts Education comprehension: verbalized understanding, returned demonstration, and verbal cues required  HOME EXERCISE PROGRAM: Access Code: [email or web address] URL: [email or web address] Date: 09/17/2022 Prepared by: Chyrel Masson  Exercises - Supine Lower Trunk Rotation  - 2-3 x daily - 7 x weekly - 1 sets - 3-5 reps - 15 hold - Standing Lumbar Extension with Counter  - 2-3 x daily - 7 x weekly - 1 sets - 5-10 reps - Supine Bridge  - 1-2 x daily - 7 x weekly - 1-2 sets - 10 reps - 2 hold - Prone Alternating Arm and Leg Lifts  - 1-2 x daily - 7 x weekly - 1-2 sets - 10 reps - 3 hold - Hooklying Clamshell with Resistance  - 1-2 x daily - 7 x weekly - 1-2 sets - 10-20 reps - Supine Piriformis Stretch with Foot on Ground  - 1-2 x daily - 7 x weekly - 1 sets - 3-5 reps - 15-30 hold  ASSESSMENT:  CLINICAL IMPRESSION: Positive initial reaction of improvement noted with use of HEP.  Progressed to include a few more strengthening and mobility interventions to add to HEP today.  Continued skilled PT services warranted at this time.   OBJECTIVE IMPAIRMENTS: decreased activity tolerance, decreased endurance, decreased mobility,  difficulty walking, decreased ROM, impaired perceived functional ability, increased muscle spasms, impaired flexibility, improper body mechanics, and pain.   ACTIVITY LIMITATIONS: carrying, lifting, bending, standing, stairs, and locomotion level  PARTICIPATION LIMITATIONS: cleaning, interpersonal relationship, shopping, and community activity  PERSONAL FACTORS:  no specific limitations  are also affecting patient's functional outcome.   REHAB POTENTIAL: Good  CLINICAL DECISION MAKING: Stable/uncomplicated  EVALUATION COMPLEXITY: Low   GOALS: Goals reviewed with patient? Yes  SHORT TERM GOALS: (target date for Short term goals are 3 weeks 09/30/2022)  1. Patient will demonstrate independent use of home exercise program to maintain progress from in clinic treatments.  Goal status:  Met   LONG TERM GOALS: (target dates for all long term goals are 12 weeks  12/02/2022 )   1. Patient will demonstrate/report pain at worst less than or equal to 2/10 to facilitate minimal limitation in daily activity secondary to pain symptoms.  Goal status: on going 09/17/2022  2. Patient will demonstrate independent use of home exercise program to facilitate ability to maintain/progress functional gains from skilled physical therapy services.  Goal status: on going 09/17/2022   3. Patient will demonstrate FOTO outcome > or = 72 % to indicate reduced disability due to condition.  Goal status: on going 09/17/2022   4. Patient will demonstrate lumbar extension 100 % WFL s symptoms to facilitate upright standing, walking posture at PLOF s limitation.  Goal status: on going 09/17/2022   5.  Patient will demonstrate/report walking distances unrestricted due to symptoms.   Goal status: on going 09/17/2022    PLAN:  PT FREQUENCY: 1-2x/week  PT DURATION: 12 weeks  PLANNED INTERVENTIONS: Therapeutic exercises, Therapeutic activity, Neuro Muscular re-education, Balance training, Gait training,  Patient/Family education, Joint mobilization, Stair training, DME instructions, Dry Needling, Electrical stimulation, Cryotherapy, vasopneumatic device, Moist heat, Taping, Traction Ultrasound, Ionotophoresis 4mg /ml Dexamethasone, and aquatic therapy, Manual therapy.  All included unless contraindicated  PLAN FOR NEXT SESSION: Continue mobility gains and overall strengthening program as tolerated.    Chyrel Masson, PT, DPT, OCS, ATC 09/17/22  9:21 AM     Referring diagnosis? M54.42,M54.41,G89.29 (ICD-10-CM) - Chronic bilateral low back pain with bilateral sciatica Treatment diagnosis? (if different than referring diagnosis) M54.59 What was this (referring dx) caused by? []  Surgery []  Fall [x]  Ongoing issue []  Arthritis []  Other: ____________  Laterality: [x]  Rt []  Lt []  Both  Check all possible CPT codes:  *CHOOSE 10 OR LESS*    []  97110 (Therapeutic Exercise)  []  92507 (SLP Treatment)  []  97112 (Neuro Re-ed)   []  92526 (Swallowing Treatment)   []  97116 (Gait Training)   []  [id number] (Cognitive Training, 1st 15 minutes) []  97140 (Manual Therapy)   []  97130 (Cognitive Training, each add'l 15 minutes)  []  97164 (Re-evaluation)                              []  Other, List CPT Code ____________  []  97530 (Therapeutic Activities)     []  97535 (Self Care)   [x]  All codes above (97110 - 97535)  [x]  97012 (Mechanical Traction)  [x]  97014 (E-stim Unattended)  []  97032 (E-stim manual)  []  97033 (Ionto)  []  97035 (Ultrasound) [x]  97750 (Physical Performance Training) []  [id number] (Aquatic Therapy) []  97016 (Vasopneumatic Device) []  [id number] (Paraffin) []  97034 (Contrast Bath) []  97597 (Wound Care 1st 20 sq cm) []  91478 (Wound Care each add'l 20 sq cm) []  97760 (Orthotic Fabrication, Fitting, Training Initial) []  [id number] (Prosthetic Management and Training Initial) []  [id number] (Orthotic or Prosthetic Training/ Modification Subsequent)

## 2022-09-23 ENCOUNTER — Encounter: Payer: Medicare HMO | Admitting: Rehabilitative and Restorative Service Providers"

## 2022-09-30 ENCOUNTER — Encounter: Payer: Medicare HMO | Admitting: Rehabilitative and Restorative Service Providers"

## 2022-10-03 ENCOUNTER — Ambulatory Visit (INDEPENDENT_AMBULATORY_CARE_PROVIDER_SITE_OTHER): Payer: Medicare HMO | Admitting: Sports Medicine

## 2022-10-03 ENCOUNTER — Ambulatory Visit (INDEPENDENT_AMBULATORY_CARE_PROVIDER_SITE_OTHER): Payer: Medicare HMO | Admitting: Rehabilitative and Restorative Service Providers"

## 2022-10-03 ENCOUNTER — Encounter: Payer: Self-pay | Admitting: Sports Medicine

## 2022-10-03 ENCOUNTER — Encounter: Payer: Self-pay | Admitting: Rehabilitative and Restorative Service Providers"

## 2022-10-03 DIAGNOSIS — M5459 Other low back pain: Secondary | ICD-10-CM

## 2022-10-03 DIAGNOSIS — M4186 Other forms of scoliosis, lumbar region: Secondary | ICD-10-CM

## 2022-10-03 DIAGNOSIS — Z96652 Presence of left artificial knee joint: Secondary | ICD-10-CM

## 2022-10-03 DIAGNOSIS — M5441 Lumbago with sciatica, right side: Secondary | ICD-10-CM | POA: Diagnosis not present

## 2022-10-03 DIAGNOSIS — M79604 Pain in right leg: Secondary | ICD-10-CM | POA: Diagnosis not present

## 2022-10-03 DIAGNOSIS — R262 Difficulty in walking, not elsewhere classified: Secondary | ICD-10-CM

## 2022-10-03 DIAGNOSIS — M5136 Other intervertebral disc degeneration, lumbar region: Secondary | ICD-10-CM

## 2022-10-03 DIAGNOSIS — G8929 Other chronic pain: Secondary | ICD-10-CM

## 2022-10-03 NOTE — Progress Notes (Signed)
Caroline [name] - 72 y.o. female MRN [id number]  Date of birth: 13-Sep-1950  Office Visit Note: Visit Date: 10/03/2022 PCP: Aliene Beams, MD Referred by: Aliene Beams, MD  Subjective: Chief Complaint  Patient presents with   Lower Back - Follow-up   HPI: Caroline [name] is a pleasant 72 y.o. female who presents today for low back pain with radiculopathy.  Last saw her on 08/22/2022 and did start her on a 6-day Medrol Dosepak.  Has also gotten her started in formalized physical therapy, she has had at least 2 sessions.  Currently she is significantly improved.  Only very mild back pain, feels like her pain and radicular symptoms are more than 90% improved.  They did give her some home exercises to do on her own as well, as her last PT session today and then wants to transition to home therapy.  Completed the methylprednisolone course.  Also brought up some left knee discomfort. History of left knee TKA back in June 2023 by Dr. August Saucer.  She has chronically had some swelling over the lateral side of the knee.  Does note when she knocks on it she feels a metal like sensation.  No new injury.  No instability.  Pertinent ROS were reviewed with the patient and found to be negative unless otherwise specified above in HPI.   Assessment & Plan: Visit Diagnoses:  1. Chronic bilateral low back pain with right-sided sciatica   2. DDD (degenerative disc disease), lumbar   3. History of total knee arthroplasty, left   4. Dextroscoliosis of lumbar spine    Plan: Caroline [name] is doing much improved from a low back in radiculitis component.  Did have good results from the methylprednisolone taper pack, she has completed this and will keep this discontinued at that time.  She has had a few sessions of formalized physical therapy, she will have 1 today and then I think given her significant improvement can discontinue and transition to home exercise as needed.  Did discuss that she does have rather significant lumbar  degenerative disc disease, this can always flare back up but if she keeps her low back and core strong this will help support the back.  She also brought up some mild swelling about the left knee from her prior TKA which was done by Dr. August Saucer.  I cannot appreciate any instability or abnormality of the hardware upon exam today.  She had an old prescription for Celebrex in the past, she may take this as needed.  Discussed placing some ice on the knee as there is some mild soft tissue swelling.  May ice for 15 to 20 minutes for the next 3 to 4 days and see if this helps with her pain.  Did think it may be wise for her to make a follow-up appoint with Dr. August Saucer if it continues to give her any issues given he was her Pensions consultant.  She is agreeable and understanding.  Follow-up: Return if symptoms worsen or fail to improve.   Meds & Orders: No orders of the defined types were placed in this encounter.  No orders of the defined types were placed in this encounter.    Procedures: No procedures performed      Clinical History: No specialty comments available.  She reports that she quit smoking about 54 years ago. Her smoking use included cigarettes. She has never used smokeless tobacco. No results for input(s): "HGBA1C", "LABURIC" in the last 8760 hours.  Objective:   Vital Signs:  There were no vitals taken for this visit.  Physical Exam  Gen: Well-appearing, in no acute distress; non-toxic CV:  Well-perfused. Warm.  Resp: Breathing unlabored on room air; no wheezing. Psych: Fluid speech in conversation; appropriate affect; normal thought process Neuro: Sensation intact throughout. No gross coordination deficits.   Ortho Exam - Lumbar: No midline spinous process TTP.  Full range of motion with flexion and extension.  Negative straight leg raise today.  5/5 strength of bilateral lower extremities.  - Left knee: Left knee has a well-healed vertical incision from prior TKA.  There is a very mild  degree of soft tissue swelling around the knee, very mild warmth but no redness.  There is full range flexion and extension without any locking.  No instability with ligamental testing.  Imaging:  *Independent review and interpretation of left knee x-ray from 10/16/2021, AP and lateral femoral independently reviewed by myself today.  Evidence of prior knee prosthesis which is in good position with no other bony abnormality.  Past Medical/Family/Surgical/Social History: Medications & Allergies reviewed per EMR, new medications updated. Patient Active Problem List   Diagnosis Date Noted   Arthritis of left knee    Acute encephalopathy/delirium  10/04/2021   S/P total knee arthroplasty, left 10/01/2021   Postcalcaneal bursitis of right foot 03/11/2021   Primary osteoarthritis of both knees 03/11/2021   Chronic pain of both knees 03/11/2021   Asthma due to seasonal allergies 11/17/2019   Osteopenia of multiple sites 10/19/2018   Chronic sinusitis 06/10/2018   Essential hypertension, benign 10/22/2017   Benign hematuria 06/17/2017   Pure hypercholesterolemia 08/11/2016   Anxiety and depression 08/11/2016   Chronic migraine without aura without status migrainosus, not intractable 08/11/2016   Past Medical History:  Diagnosis Date   Allergy    Zyrtec, Benadryl   Anxiety    Zoloft since several years   Asthma    Balance problem    Cataract    Complication of anesthesia    GERD (gastroesophageal reflux disease)    Hyperlipidemia    Hypertension    Memory changes    Migraines    topmax and Imitrex   Family History  Problem Relation Age of Onset   Hypertension Mother    Stroke Mother    Dementia Mother    Cancer Father 73       oral cancer   Heart disease Father        PAD/femoral bypass   Hypertension Father    Hyperlipidemia Father    Alcohol abuse Father    Hyperlipidemia Sister    Hypertension Sister    Breast cancer Maternal Aunt    Past Surgical History:   Procedure Laterality Date   ABDOMINAL HYSTERECTOMY  2016   uterine prolapse; ovaries INTACT   BLADDER REPAIR     BREAST BIOPSY     NL   BREAST EXCISIONAL BIOPSY     BREAST SURGERY     breast bx x 2 in 1980s; benign   CARPAL TUNNEL RELEASE Bilateral    CATARACT EXTRACTION, BILATERAL  2018   TOTAL KNEE ARTHROPLASTY Left 10/01/2021   Procedure: LEFT TOTAL KNEE ARTHROPLASTY;  Surgeon: Cammy Copa, MD;  Location: [facility] OR;  Service: Orthopedics;  Laterality: Left;   Social History   Occupational History   Occupation: employed  Tobacco Use   Smoking status: Former    Types: Cigarettes    Quit date: 04/07/1968    Years since quitting: 54.5   Smokeless tobacco: Never  Vaping Use   Vaping Use: Never used  Substance and Sexual Activity   Alcohol use: Yes    Comment: rarely wine   Drug use: No   Sexual activity: Yes    Birth control/protection: Post-menopausal, Surgical

## 2022-10-03 NOTE — Therapy (Signed)
OUTPATIENT PHYSICAL THERAPY TREATMENT /DISCHARGE   Patient Name: Caroline [name] MRN: [id number] DOB:04-Jul-1950, 72 y.o., female Today's Date: 10/03/2022  PHYSICAL THERAPY DISCHARGE SUMMARY  Visits from Start of Care: 3  Current functional level related to goals / functional outcomes: See note   Remaining deficits: See note   Education / Equipment: HEP  Patient goals were  mostly met . Patient is being discharged due to being pleased with the current functional level.   END OF SESSION:  PT End of Session - 10/03/22 1108     Visit Number 3    Number of Visits 24    Date for PT Re-Evaluation 12/02/22    Authorization Type [employer] $25 copay    Authorization - Visit Number 3    Authorization - Number of Visits 12    PT Start Time 1114    PT Stop Time 1137    PT Time Calculation (min) 23 min    Activity Tolerance Patient tolerated treatment well    Behavior During Therapy WFL for tasks assessed/performed               Past Medical History:  Diagnosis Date   Allergy    Zyrtec, Benadryl   Anxiety    Zoloft since several years   Asthma    Balance problem    Cataract    Complication of anesthesia    GERD (gastroesophageal reflux disease)    Hyperlipidemia    Hypertension    Memory changes    Migraines    topmax and Imitrex   Past Surgical History:  Procedure Laterality Date   ABDOMINAL HYSTERECTOMY  2016   uterine prolapse; ovaries INTACT   BLADDER REPAIR     BREAST BIOPSY     NL   BREAST EXCISIONAL BIOPSY     BREAST SURGERY     breast bx x 2 in 1980s; benign   CARPAL TUNNEL RELEASE Bilateral    CATARACT EXTRACTION, BILATERAL  2018   TOTAL KNEE ARTHROPLASTY Left 10/01/2021   Procedure: LEFT TOTAL KNEE ARTHROPLASTY;  Surgeon: Cammy Copa, MD;  Location: [facility] OR;  Service: Orthopedics;  Laterality: Left;   Patient Active Problem List   Diagnosis Date Noted   Arthritis of left knee    Acute encephalopathy/delirium  10/04/2021   S/P total knee  arthroplasty, left 10/01/2021   Postcalcaneal bursitis of right foot 03/11/2021   Primary osteoarthritis of both knees 03/11/2021   Chronic pain of both knees 03/11/2021   Asthma due to seasonal allergies 11/17/2019   Osteopenia of multiple sites 10/19/2018   Chronic sinusitis 06/10/2018   Essential hypertension, benign 10/22/2017   Benign hematuria 06/17/2017   Pure hypercholesterolemia 08/11/2016   Anxiety and depression 08/11/2016   Chronic migraine without aura without status migrainosus, not intractable 08/11/2016    PCP: Aliene Beams MD  REFERRING PROVIDER: Madelyn Brunner, DO  REFERRING DIAG: M54.42,M54.41,G89.29 (ICD-10-CM) - Chronic bilateral low back pain with bilateral sciatica  Rationale for Evaluation and Treatment: Rehabilitation  THERAPY DIAG:  Other low back pain  Pain in right leg  Difficulty in walking, not elsewhere classified  ONSET DATE: Several months approx. March 2024.   SUBJECTIVE:  SUBJECTIVE STATEMENT: Reported occasional symptoms but improved after 10 mins.  Saw MD today and was released due to improvement.  Global rating of change a great deal better +6.   PERTINENT HISTORY:  History of Rt lower leg/foot pain with therapy services within last year.  History of Lt knee replacement   PAIN:  NPRS scale:  occasional  7/10 Pain location: back , Rt leg Pain description: severe ache Aggravating factors: walking, standing prolonged Relieving factors: stretching, tylenol as needed  PRECAUTIONS: None  WEIGHT BEARING RESTRICTIONS: No  FALLS:  Has patient fallen in last 6 months? 1 rolled out of bed.   LIVING ENVIRONMENT: Lives in: House/apartment Stairs: 1 step into without rail  OCCUPATION: Retired   PLOF:  Independent , Engineer, site.  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  Previously did walking for exercise.  Ride stationary bike   PATIENT GOALS: Reduce pain, be able to walk more    OBJECTIVE:   PATIENT SURVEYS:  10/03/2022:  FOTO update: 69  09/09/2022 FOTO eval:  69  predicted:  72  SCREENING FOR RED FLAGS: 09/09/2022 Bowel or bladder incontinence: No Cauda equina syndrome: No  COGNITION: 09/09/2022 Overall cognitive status: WFL normal      SENSATION: 09/09/2022 No limitations  MUSCLE LENGTH: 09/09/2022 Passive SLR Lt 90 deg, Rt 80 deg  POSTURE:  09/09/2022  Mild increased thoracic kyphosis.  Equal iliac crest in standing noted.   No lateral shift noted.   PALPATION: 09/09/2022 No specific tenderness to touch.   LUMBAR ROM:   09/09/2022: no centralization or peripheralization noted.   AROM 09/09/2022 09/17/2022  Flexion Movement to mid shin without complaints.    Extension 75  % WFL without complaints 100 % WFL without complaints  Right lateral flexion Head of fibula without complaint c pulling Rt hip Head of fibula without complaint  Left lateral flexion Head of fibula without complaint Head of fibula without complaint  Right rotation    Left rotation     (Blank rows = not tested)  LOWER EXTREMITY ROM:      Right 09/09/2022 Left 09/09/2022  Hip flexion    Hip extension    Hip abduction    Hip adduction    Hip internal rotation    Hip external rotation    Knee flexion    Knee extension    Ankle dorsiflexion    Ankle plantarflexion    Ankle inversion    Ankle eversion     (Blank rows = not tested)  LOWER EXTREMITY MMT:    MMT Right 09/09/2022 Left 09/09/2022  Hip flexion 5/5 5/5  Hip extension    Hip abduction    Hip adduction    Hip internal rotation    Hip external rotation    Knee flexion 5/5 5/5  Knee extension 5/5 5/5  Ankle dorsiflexion    Ankle plantarflexion    Ankle inversion    Ankle eversion     (Blank rows = not tested)  LUMBAR SPECIAL TESTS:  09/09/2022 (-) slump bilateral, (-) crossed slr  bilateral  FUNCTIONAL TESTS:  09/09/2022 18 inch chair transfer s UE assist s symptoms.   GAIT: 09/09/2022 Independent                       TODAY'S TREATMENT:  DATE: 10/03/2022  Therex: Nustep Lvl 6 UE/LE 10 mins for aerobic exercise/mobility Review of HEP existing c updates verbally Seated SLR x 10 bilateral Sit to stand to sit 18 inch chair c slow lowering focus x 10   TODAY'S TREATMENT:                                                                             DATE: 09/17/2022  Therex: Nustep Lvl 6 UE/LE 10 mins for aerobic exercise/mobility Review of existing HEP verbally Supine bridge c green band hip abduction hold 2 x 10 Supine clam shell green band c isometric hold contralateral leg x 20 bilateral Supine pball press down 10 sec hold x 10  Supine figure 4 push away stretch 15 sec x 3 bilateral Supine figure 4 pull towards stretch 15 sec x 3 bilateral Seated UE lift into table multifidi activation 10 sec hold x 10    TODAY'S TREATMENT:                                                                             DATE: 09/09/2022  Therex:    HEP instruction/performance c cues for techniques, handout provided.  Trial set performed of each for comprehension and symptom assessment.  See below for exercise list  PATIENT EDUCATION:  09/17/2022 Education details: HEP update Person educated: Patient Education method: Explanation, Demonstration, Verbal cues, and Handouts Education comprehension: verbalized understanding, returned demonstration, and verbal cues required  HOME EXERCISE PROGRAM: Access Code: [email or web address] URL: [email or web address] Date: 10/03/2022 Prepared by: Chyrel Masson  Exercises - Supine Lower Trunk Rotation  - 2-3 x daily - 7 x weekly - 1 sets - 3-5 reps - 15 hold - Standing Lumbar Extension with Counter  - 2-3 x daily - 7 x weekly - 1 sets - 5-10 reps - Supine Bridge  -  1-2 x daily - 7 x weekly - 1-2 sets - 10 reps - 2 hold - Prone Alternating Arm and Leg Lifts  - 1-2 x daily - 7 x weekly - 1-2 sets - 10 reps - 3 hold - Hooklying Clamshell with Resistance  - 1-2 x daily - 7 x weekly - 1-2 sets - 10-20 reps - Supine Piriformis Stretch with Foot on Ground  - 1-2 x daily - 7 x weekly - 1 sets - 3-5 reps - 15-30 hold - Seated Straight Leg Heel Taps  - 1-2 x daily - 7 x weekly - 3 sets - 10 reps - Sit to Stand  - 3 x daily - 7 x weekly - 1 sets - 10 reps  ASSESSMENT:  CLINICAL IMPRESSION: Pt reported continued reduction of symptom frequency and overall good improvement (GROC +6).  Due to improvements noted in subjective and objective data, request was noted from Pt to discharge to HEP.  Clinician in agreement.   HEP review and handout updated.   OBJECTIVE IMPAIRMENTS: decreased activity tolerance, decreased endurance, decreased  mobility, difficulty walking, decreased ROM, impaired perceived functional ability, increased muscle spasms, impaired flexibility, improper body mechanics, and pain.   ACTIVITY LIMITATIONS: carrying, lifting, bending, standing, stairs, and locomotion level  PARTICIPATION LIMITATIONS: cleaning, interpersonal relationship, shopping, and community activity  PERSONAL FACTORS:  no specific limitations  are also affecting patient's functional outcome.   REHAB POTENTIAL: Good  CLINICAL DECISION MAKING: Stable/uncomplicated  EVALUATION COMPLEXITY: Low   GOALS: Goals reviewed with patient? Yes  SHORT TERM GOALS: (target date for Short term goals are 3 weeks 09/30/2022)  1. Patient will demonstrate independent use of home exercise program to maintain progress from in clinic treatments.  Goal status:  Met   LONG TERM GOALS: (target dates for all long term goals are 12 weeks  12/02/2022 )   1. Patient will demonstrate/report pain at worst less than or equal to 2/10 to facilitate minimal limitation in daily activity secondary to pain  symptoms.  Goal status: mostly met 10/03/2022   2. Patient will demonstrate independent use of home exercise program to facilitate ability to maintain/progress functional gains from skilled physical therapy services.  Goal status: met 10/03/2022   3. Patient will demonstrate FOTO outcome > or = 72 % to indicate reduced disability due to condition.  Goal status: partially met 10/03/2022   4. Patient will demonstrate lumbar extension 100 % WFL s symptoms to facilitate upright standing, walking posture at PLOF s limitation.  Goal status: met 10/03/2022   5.  Patient will demonstrate/report walking distances unrestricted due to symptoms.   Goal status: mostly met 10/03/2022    PLAN:  PT FREQUENCY: 1-2x/week  PT DURATION: 12 weeks  PLANNED INTERVENTIONS: Therapeutic exercises, Therapeutic activity, Neuro Muscular re-education, Balance training, Gait training, Patient/Family education, Joint mobilization, Stair training, DME instructions, Dry Needling, Electrical stimulation, Cryotherapy, vasopneumatic device, Moist heat, Taping, Traction Ultrasound, Ionotophoresis 4mg /ml Dexamethasone, and aquatic therapy, Manual therapy.  All included unless contraindicated  PLAN FOR NEXT SESSION: Discharge to HEP.     Chyrel Masson, PT, DPT, OCS, ATC 10/03/22  11:37 AM     Referring diagnosis? M54.42,M54.41,G89.29 (ICD-10-CM) - Chronic bilateral low back pain with bilateral sciatica Treatment diagnosis? (if different than referring diagnosis) M54.59 What was this (referring dx) caused by? []  Surgery []  Fall [x]  Ongoing issue []  Arthritis []  Other: ____________  Laterality: [x]  Rt []  Lt []  Both  Check all possible CPT codes:  *CHOOSE 10 OR LESS*    []  97110 (Therapeutic Exercise)  []  92507 (SLP Treatment)  []  97112 (Neuro Re-ed)   []  92526 (Swallowing Treatment)   []  97116 (Gait Training)   []  [id number] (Cognitive Training, 1st 15 minutes) []  16109 (Manual Therapy)   []  97130 (Cognitive  Training, each add'l 15 minutes)  []  97164 (Re-evaluation)                              []  Other, List CPT Code ____________  []  97530 (Therapeutic Activities)     []  97535 (Self Care)   [x]  All codes above (97110 - 97535)  [x]  97012 (Mechanical Traction)  [x]  97014 (E-stim Unattended)  []  97032 (E-stim manual)  []  97033 (Ionto)  []  97035 (Ultrasound) [x]  97750 (Physical Performance Training) []  [id number] (Aquatic Therapy) []  97016 (Vasopneumatic Device) []  [id number] (Paraffin) []  97034 (Contrast Bath) []  97597 (Wound Care 1st 20 sq cm) []  97598 (Wound Care each add'l 20 sq cm) []  97760 (Orthotic Fabrication, Fitting, Training Initial) []  [id number] (Prosthetic Management and Training  Initial) '[]'$  [id number] (Orthotic or Prosthetic Training/ Modification Subsequent)

## 2022-10-03 NOTE — Progress Notes (Signed)
Doing great;  Working with PT/HEP as directed  PRN advil for pain

## 2022-10-06 ENCOUNTER — Encounter: Payer: Self-pay | Admitting: Rehabilitative and Restorative Service Providers"

## 2022-10-06 ENCOUNTER — Encounter: Payer: Medicare HMO | Admitting: Rehabilitative and Restorative Service Providers"

## 2022-12-04 ENCOUNTER — Other Ambulatory Visit: Payer: Self-pay | Admitting: Family Medicine

## 2022-12-04 DIAGNOSIS — E2839 Other primary ovarian failure: Secondary | ICD-10-CM

## 2023-01-22 ENCOUNTER — Encounter: Payer: Self-pay | Admitting: Dermatology

## 2023-01-22 ENCOUNTER — Ambulatory Visit: Payer: Medicare HMO | Admitting: Dermatology

## 2023-01-22 VITALS — BP 112/75 | HR 66

## 2023-01-22 DIAGNOSIS — L814 Other melanin hyperpigmentation: Secondary | ICD-10-CM

## 2023-01-22 DIAGNOSIS — L820 Inflamed seborrheic keratosis: Secondary | ICD-10-CM

## 2023-01-22 MED ORDER — HYDROQUINONE 4 % EX CREA: TOPICAL_CREAM | Freq: Every evening | CUTANEOUS | 2 refills | Status: AC

## 2023-01-22 NOTE — Progress Notes (Signed)
   New Patient Visit   Subjective  Caroline [name] is a 72 y.o. female who presents for the following: Spot check  Patient states she has moles and skin tags located at the scattered that she would like to have examined. Patient reports the areas have been there for 2 years. She reports the areas are not bothersome.Patient rates irritation 0 out of 10. She states that the areas have not spread. Patient reports she has not previously been treated for these areas. Patient denies Hx of bx. Patient denies family history of skin cancer(s).  The patient has spots, moles and lesions to be evaluated, some may be new or changing and the patient may have concern these could be cancer.  The following portions of the chart were reviewed this encounter and updated as appropriate: medications, allergies, medical history  Review of Systems:  No other skin or systemic complaints except as noted in HPI or Assessment and Plan.  Objective  Well appearing patient in no apparent distress; mood and affect are within normal limits.  A focused examination was performed of the following areas: Left Cheek, Right Shoulder, Left Calf, Inner left arm and chest & Scalp  Relevant exam findings are noted in the Assessment and Plan.  Scalp x 1, right shoulder x 1, left antecubital x 1, left lower leg x 1 (4) Erythematous stuck-on, waxy papule or plaque    Assessment & Plan   LENTIGINES Exam: scattered tan macules Due to sun exposure Treatment Plan: Benign-appearing, observe. Recommend daily broad spectrum sunscreen SPF 30+ to sun-exposed areas, reapply every 2 hours as needed.  Start 4% Hydroquinone at bedtime x 4 months   Inflamed seborrheic keratosis (4) Scalp x 1, right shoulder x 1, left antecubital x 1, left lower leg x 1  Symptomatic, irritating, patient would like treated.  Benign-appearing.  Call clinic for new or changing lesions.    Destruction of lesion - Scalp x 1, right shoulder x 1, left  antecubital x 1, left lower leg x 1 (4) Complexity: simple   Destruction method: cryotherapy   Informed consent: discussed and consent obtained   Timeout:  patient name, date of birth, surgical site, and procedure verified Lesion destroyed using liquid nitrogen: Yes   Region frozen until ice ball extended beyond lesion: Yes   Outcome: patient tolerated procedure well with no complications   Post-procedure details: wound care instructions given      Return in about 4 months (around 05/25/2023) for TBSE, ISK follow up.  I, Joanie Coddington, CMA, am acting as scribe for [employer], DO .   Documentation: I have reviewed the above documentation for accuracy and completeness, and I agree with the above.  Langston Reusing, DO

## 2023-01-22 NOTE — Patient Instructions (Addendum)

## 2023-01-26 ENCOUNTER — Other Ambulatory Visit (HOSPITAL_BASED_OUTPATIENT_CLINIC_OR_DEPARTMENT_OTHER): Payer: Self-pay

## 2023-01-26 MED ORDER — COMIRNATY 30 MCG/0.3ML IM SUSY
0.3000 mL | PREFILLED_SYRINGE | Freq: Once | INTRAMUSCULAR | 0 refills | Status: AC
  Filled 2023-01-26: qty 0.3, 1d supply, fill #0

## 2023-01-26 MED ORDER — FLUAD 0.5 ML IM SUSY
0.5000 mL | PREFILLED_SYRINGE | Freq: Once | INTRAMUSCULAR | 0 refills | Status: AC
  Filled 2023-01-26: qty 0.5, 1d supply, fill #0

## 2023-04-09 DIAGNOSIS — M9905 Segmental and somatic dysfunction of pelvic region: Secondary | ICD-10-CM | POA: Diagnosis not present

## 2023-04-09 DIAGNOSIS — M9901 Segmental and somatic dysfunction of cervical region: Secondary | ICD-10-CM | POA: Diagnosis not present

## 2023-04-09 DIAGNOSIS — M9903 Segmental and somatic dysfunction of lumbar region: Secondary | ICD-10-CM | POA: Diagnosis not present

## 2023-04-09 DIAGNOSIS — M9902 Segmental and somatic dysfunction of thoracic region: Secondary | ICD-10-CM | POA: Diagnosis not present

## 2023-04-13 DIAGNOSIS — M9903 Segmental and somatic dysfunction of lumbar region: Secondary | ICD-10-CM | POA: Diagnosis not present

## 2023-04-13 DIAGNOSIS — M9902 Segmental and somatic dysfunction of thoracic region: Secondary | ICD-10-CM | POA: Diagnosis not present

## 2023-04-13 DIAGNOSIS — M9901 Segmental and somatic dysfunction of cervical region: Secondary | ICD-10-CM | POA: Diagnosis not present

## 2023-04-13 DIAGNOSIS — M9905 Segmental and somatic dysfunction of pelvic region: Secondary | ICD-10-CM | POA: Diagnosis not present

## 2023-04-16 DIAGNOSIS — M9905 Segmental and somatic dysfunction of pelvic region: Secondary | ICD-10-CM | POA: Diagnosis not present

## 2023-04-16 DIAGNOSIS — M9901 Segmental and somatic dysfunction of cervical region: Secondary | ICD-10-CM | POA: Diagnosis not present

## 2023-04-16 DIAGNOSIS — M9902 Segmental and somatic dysfunction of thoracic region: Secondary | ICD-10-CM | POA: Diagnosis not present

## 2023-04-16 DIAGNOSIS — M9903 Segmental and somatic dysfunction of lumbar region: Secondary | ICD-10-CM | POA: Diagnosis not present

## 2023-04-20 DIAGNOSIS — M9901 Segmental and somatic dysfunction of cervical region: Secondary | ICD-10-CM | POA: Diagnosis not present

## 2023-04-20 DIAGNOSIS — M9902 Segmental and somatic dysfunction of thoracic region: Secondary | ICD-10-CM | POA: Diagnosis not present

## 2023-04-20 DIAGNOSIS — M9905 Segmental and somatic dysfunction of pelvic region: Secondary | ICD-10-CM | POA: Diagnosis not present

## 2023-04-20 DIAGNOSIS — M9903 Segmental and somatic dysfunction of lumbar region: Secondary | ICD-10-CM | POA: Diagnosis not present

## 2023-04-23 DIAGNOSIS — M9903 Segmental and somatic dysfunction of lumbar region: Secondary | ICD-10-CM | POA: Diagnosis not present

## 2023-04-23 DIAGNOSIS — M9901 Segmental and somatic dysfunction of cervical region: Secondary | ICD-10-CM | POA: Diagnosis not present

## 2023-04-23 DIAGNOSIS — M9902 Segmental and somatic dysfunction of thoracic region: Secondary | ICD-10-CM | POA: Diagnosis not present

## 2023-04-23 DIAGNOSIS — M9905 Segmental and somatic dysfunction of pelvic region: Secondary | ICD-10-CM | POA: Diagnosis not present

## 2023-04-30 DIAGNOSIS — M9901 Segmental and somatic dysfunction of cervical region: Secondary | ICD-10-CM | POA: Diagnosis not present

## 2023-04-30 DIAGNOSIS — M9903 Segmental and somatic dysfunction of lumbar region: Secondary | ICD-10-CM | POA: Diagnosis not present

## 2023-04-30 DIAGNOSIS — M9905 Segmental and somatic dysfunction of pelvic region: Secondary | ICD-10-CM | POA: Diagnosis not present

## 2023-04-30 DIAGNOSIS — M9902 Segmental and somatic dysfunction of thoracic region: Secondary | ICD-10-CM | POA: Diagnosis not present

## 2023-05-07 DIAGNOSIS — M9901 Segmental and somatic dysfunction of cervical region: Secondary | ICD-10-CM | POA: Diagnosis not present

## 2023-05-07 DIAGNOSIS — M9902 Segmental and somatic dysfunction of thoracic region: Secondary | ICD-10-CM | POA: Diagnosis not present

## 2023-05-07 DIAGNOSIS — M9903 Segmental and somatic dysfunction of lumbar region: Secondary | ICD-10-CM | POA: Diagnosis not present

## 2023-05-07 DIAGNOSIS — M9905 Segmental and somatic dysfunction of pelvic region: Secondary | ICD-10-CM | POA: Diagnosis not present

## 2023-05-14 DIAGNOSIS — M9905 Segmental and somatic dysfunction of pelvic region: Secondary | ICD-10-CM | POA: Diagnosis not present

## 2023-05-14 DIAGNOSIS — M9902 Segmental and somatic dysfunction of thoracic region: Secondary | ICD-10-CM | POA: Diagnosis not present

## 2023-05-14 DIAGNOSIS — M9903 Segmental and somatic dysfunction of lumbar region: Secondary | ICD-10-CM | POA: Diagnosis not present

## 2023-05-14 DIAGNOSIS — M9901 Segmental and somatic dysfunction of cervical region: Secondary | ICD-10-CM | POA: Diagnosis not present

## 2023-05-21 DIAGNOSIS — M9903 Segmental and somatic dysfunction of lumbar region: Secondary | ICD-10-CM | POA: Diagnosis not present

## 2023-05-21 DIAGNOSIS — M9905 Segmental and somatic dysfunction of pelvic region: Secondary | ICD-10-CM | POA: Diagnosis not present

## 2023-05-21 DIAGNOSIS — M9902 Segmental and somatic dysfunction of thoracic region: Secondary | ICD-10-CM | POA: Diagnosis not present

## 2023-05-21 DIAGNOSIS — M9901 Segmental and somatic dysfunction of cervical region: Secondary | ICD-10-CM | POA: Diagnosis not present

## 2023-05-25 ENCOUNTER — Ambulatory Visit: Payer: Medicare HMO | Admitting: Dermatology

## 2023-05-26 DIAGNOSIS — M9902 Segmental and somatic dysfunction of thoracic region: Secondary | ICD-10-CM | POA: Diagnosis not present

## 2023-05-26 DIAGNOSIS — M9905 Segmental and somatic dysfunction of pelvic region: Secondary | ICD-10-CM | POA: Diagnosis not present

## 2023-05-26 DIAGNOSIS — M9901 Segmental and somatic dysfunction of cervical region: Secondary | ICD-10-CM | POA: Diagnosis not present

## 2023-05-26 DIAGNOSIS — M9903 Segmental and somatic dysfunction of lumbar region: Secondary | ICD-10-CM | POA: Diagnosis not present

## 2023-06-04 DIAGNOSIS — J45901 Unspecified asthma with (acute) exacerbation: Secondary | ICD-10-CM | POA: Diagnosis not present

## 2023-06-04 DIAGNOSIS — I10 Essential (primary) hypertension: Secondary | ICD-10-CM | POA: Diagnosis not present

## 2023-06-04 DIAGNOSIS — G43709 Chronic migraine without aura, not intractable, without status migrainosus: Secondary | ICD-10-CM | POA: Diagnosis not present

## 2023-06-04 DIAGNOSIS — R5383 Other fatigue: Secondary | ICD-10-CM | POA: Diagnosis not present

## 2023-06-04 DIAGNOSIS — M9905 Segmental and somatic dysfunction of pelvic region: Secondary | ICD-10-CM | POA: Diagnosis not present

## 2023-06-04 DIAGNOSIS — F339 Major depressive disorder, recurrent, unspecified: Secondary | ICD-10-CM | POA: Diagnosis not present

## 2023-06-04 DIAGNOSIS — J40 Bronchitis, not specified as acute or chronic: Secondary | ICD-10-CM | POA: Diagnosis not present

## 2023-06-04 DIAGNOSIS — M9903 Segmental and somatic dysfunction of lumbar region: Secondary | ICD-10-CM | POA: Diagnosis not present

## 2023-06-04 DIAGNOSIS — M9902 Segmental and somatic dysfunction of thoracic region: Secondary | ICD-10-CM | POA: Diagnosis not present

## 2023-06-04 DIAGNOSIS — M9901 Segmental and somatic dysfunction of cervical region: Secondary | ICD-10-CM | POA: Diagnosis not present

## 2023-06-11 DIAGNOSIS — M9905 Segmental and somatic dysfunction of pelvic region: Secondary | ICD-10-CM | POA: Diagnosis not present

## 2023-06-11 DIAGNOSIS — M9901 Segmental and somatic dysfunction of cervical region: Secondary | ICD-10-CM | POA: Diagnosis not present

## 2023-06-11 DIAGNOSIS — M9902 Segmental and somatic dysfunction of thoracic region: Secondary | ICD-10-CM | POA: Diagnosis not present

## 2023-06-11 DIAGNOSIS — M9903 Segmental and somatic dysfunction of lumbar region: Secondary | ICD-10-CM | POA: Diagnosis not present

## 2023-06-16 ENCOUNTER — Ambulatory Visit
Admission: RE | Admit: 2023-06-16 | Discharge: 2023-06-16 | Disposition: A | Discharge status: Home / Self Care | Payer: Medicare HMO | Source: Ambulatory Visit | Attending: Family Medicine

## 2023-06-16 DIAGNOSIS — E2839 Other primary ovarian failure: Secondary | ICD-10-CM

## 2023-06-16 DIAGNOSIS — M8588 Other specified disorders of bone density and structure, other site: Secondary | ICD-10-CM | POA: Diagnosis not present

## 2023-06-16 DIAGNOSIS — N958 Other specified menopausal and perimenopausal disorders: Secondary | ICD-10-CM | POA: Diagnosis not present

## 2023-06-18 DIAGNOSIS — M9903 Segmental and somatic dysfunction of lumbar region: Secondary | ICD-10-CM | POA: Diagnosis not present

## 2023-06-18 DIAGNOSIS — M9902 Segmental and somatic dysfunction of thoracic region: Secondary | ICD-10-CM | POA: Diagnosis not present

## 2023-06-18 DIAGNOSIS — M9901 Segmental and somatic dysfunction of cervical region: Secondary | ICD-10-CM | POA: Diagnosis not present

## 2023-06-18 DIAGNOSIS — M9905 Segmental and somatic dysfunction of pelvic region: Secondary | ICD-10-CM | POA: Diagnosis not present

## 2023-06-25 DIAGNOSIS — M9902 Segmental and somatic dysfunction of thoracic region: Secondary | ICD-10-CM | POA: Diagnosis not present

## 2023-06-25 DIAGNOSIS — M9905 Segmental and somatic dysfunction of pelvic region: Secondary | ICD-10-CM | POA: Diagnosis not present

## 2023-06-25 DIAGNOSIS — M9901 Segmental and somatic dysfunction of cervical region: Secondary | ICD-10-CM | POA: Diagnosis not present

## 2023-06-25 DIAGNOSIS — M9903 Segmental and somatic dysfunction of lumbar region: Secondary | ICD-10-CM | POA: Diagnosis not present

## 2023-07-02 DIAGNOSIS — M9903 Segmental and somatic dysfunction of lumbar region: Secondary | ICD-10-CM | POA: Diagnosis not present

## 2023-07-02 DIAGNOSIS — M9902 Segmental and somatic dysfunction of thoracic region: Secondary | ICD-10-CM | POA: Diagnosis not present

## 2023-07-02 DIAGNOSIS — M9901 Segmental and somatic dysfunction of cervical region: Secondary | ICD-10-CM | POA: Diagnosis not present

## 2023-07-02 DIAGNOSIS — M9905 Segmental and somatic dysfunction of pelvic region: Secondary | ICD-10-CM | POA: Diagnosis not present

## 2023-07-09 DIAGNOSIS — M9902 Segmental and somatic dysfunction of thoracic region: Secondary | ICD-10-CM | POA: Diagnosis not present

## 2023-07-09 DIAGNOSIS — M9905 Segmental and somatic dysfunction of pelvic region: Secondary | ICD-10-CM | POA: Diagnosis not present

## 2023-07-09 DIAGNOSIS — M9903 Segmental and somatic dysfunction of lumbar region: Secondary | ICD-10-CM | POA: Diagnosis not present

## 2023-07-09 DIAGNOSIS — M9901 Segmental and somatic dysfunction of cervical region: Secondary | ICD-10-CM | POA: Diagnosis not present

## 2023-07-15 DIAGNOSIS — I10 Essential (primary) hypertension: Secondary | ICD-10-CM | POA: Diagnosis not present

## 2023-07-15 DIAGNOSIS — M810 Age-related osteoporosis without current pathological fracture: Secondary | ICD-10-CM | POA: Diagnosis not present

## 2023-07-15 DIAGNOSIS — G43709 Chronic migraine without aura, not intractable, without status migrainosus: Secondary | ICD-10-CM | POA: Diagnosis not present

## 2023-07-15 DIAGNOSIS — F3341 Major depressive disorder, recurrent, in partial remission: Secondary | ICD-10-CM | POA: Diagnosis not present

## 2023-07-16 DIAGNOSIS — M9902 Segmental and somatic dysfunction of thoracic region: Secondary | ICD-10-CM | POA: Diagnosis not present

## 2023-07-16 DIAGNOSIS — M9903 Segmental and somatic dysfunction of lumbar region: Secondary | ICD-10-CM | POA: Diagnosis not present

## 2023-07-16 DIAGNOSIS — M9905 Segmental and somatic dysfunction of pelvic region: Secondary | ICD-10-CM | POA: Diagnosis not present

## 2023-07-16 DIAGNOSIS — M9901 Segmental and somatic dysfunction of cervical region: Secondary | ICD-10-CM | POA: Diagnosis not present

## 2023-07-23 DIAGNOSIS — M9902 Segmental and somatic dysfunction of thoracic region: Secondary | ICD-10-CM | POA: Diagnosis not present

## 2023-07-23 DIAGNOSIS — M9903 Segmental and somatic dysfunction of lumbar region: Secondary | ICD-10-CM | POA: Diagnosis not present

## 2023-07-23 DIAGNOSIS — M9905 Segmental and somatic dysfunction of pelvic region: Secondary | ICD-10-CM | POA: Diagnosis not present

## 2023-07-23 DIAGNOSIS — M9901 Segmental and somatic dysfunction of cervical region: Secondary | ICD-10-CM | POA: Diagnosis not present

## 2023-07-30 ENCOUNTER — Other Ambulatory Visit: Payer: Self-pay | Admitting: Family Medicine

## 2023-07-30 DIAGNOSIS — N6325 Unspecified lump in the left breast, overlapping quadrants: Secondary | ICD-10-CM | POA: Diagnosis not present

## 2023-07-30 DIAGNOSIS — M9905 Segmental and somatic dysfunction of pelvic region: Secondary | ICD-10-CM | POA: Diagnosis not present

## 2023-07-30 DIAGNOSIS — M9902 Segmental and somatic dysfunction of thoracic region: Secondary | ICD-10-CM | POA: Diagnosis not present

## 2023-07-30 DIAGNOSIS — M9901 Segmental and somatic dysfunction of cervical region: Secondary | ICD-10-CM | POA: Diagnosis not present

## 2023-07-30 DIAGNOSIS — M9903 Segmental and somatic dysfunction of lumbar region: Secondary | ICD-10-CM | POA: Diagnosis not present

## 2023-08-06 DIAGNOSIS — M9905 Segmental and somatic dysfunction of pelvic region: Secondary | ICD-10-CM | POA: Diagnosis not present

## 2023-08-06 DIAGNOSIS — M9902 Segmental and somatic dysfunction of thoracic region: Secondary | ICD-10-CM | POA: Diagnosis not present

## 2023-08-06 DIAGNOSIS — M9903 Segmental and somatic dysfunction of lumbar region: Secondary | ICD-10-CM | POA: Diagnosis not present

## 2023-08-06 DIAGNOSIS — M9901 Segmental and somatic dysfunction of cervical region: Secondary | ICD-10-CM | POA: Diagnosis not present

## 2023-08-12 ENCOUNTER — Ambulatory Visit
Admission: RE | Admit: 2023-08-12 | Discharge: 2023-08-12 | Disposition: A | Discharge status: Home / Self Care | Source: Ambulatory Visit | Attending: Family Medicine

## 2023-08-12 DIAGNOSIS — N6322 Unspecified lump in the left breast, upper inner quadrant: Secondary | ICD-10-CM | POA: Diagnosis not present

## 2023-08-12 DIAGNOSIS — N6325 Unspecified lump in the left breast, overlapping quadrants: Secondary | ICD-10-CM

## 2023-08-13 ENCOUNTER — Other Ambulatory Visit: Payer: Self-pay | Admitting: Family Medicine

## 2023-08-13 DIAGNOSIS — M9905 Segmental and somatic dysfunction of pelvic region: Secondary | ICD-10-CM | POA: Diagnosis not present

## 2023-08-13 DIAGNOSIS — M9902 Segmental and somatic dysfunction of thoracic region: Secondary | ICD-10-CM | POA: Diagnosis not present

## 2023-08-13 DIAGNOSIS — N641 Fat necrosis of breast: Secondary | ICD-10-CM

## 2023-08-13 DIAGNOSIS — M9903 Segmental and somatic dysfunction of lumbar region: Secondary | ICD-10-CM | POA: Diagnosis not present

## 2023-08-13 DIAGNOSIS — M9901 Segmental and somatic dysfunction of cervical region: Secondary | ICD-10-CM | POA: Diagnosis not present

## 2023-08-20 DIAGNOSIS — M9902 Segmental and somatic dysfunction of thoracic region: Secondary | ICD-10-CM | POA: Diagnosis not present

## 2023-08-20 DIAGNOSIS — M9901 Segmental and somatic dysfunction of cervical region: Secondary | ICD-10-CM | POA: Diagnosis not present

## 2023-08-20 DIAGNOSIS — M9903 Segmental and somatic dysfunction of lumbar region: Secondary | ICD-10-CM | POA: Diagnosis not present

## 2023-08-20 DIAGNOSIS — M9905 Segmental and somatic dysfunction of pelvic region: Secondary | ICD-10-CM | POA: Diagnosis not present

## 2023-08-27 DIAGNOSIS — M9902 Segmental and somatic dysfunction of thoracic region: Secondary | ICD-10-CM | POA: Diagnosis not present

## 2023-08-27 DIAGNOSIS — M9903 Segmental and somatic dysfunction of lumbar region: Secondary | ICD-10-CM | POA: Diagnosis not present

## 2023-08-27 DIAGNOSIS — M9905 Segmental and somatic dysfunction of pelvic region: Secondary | ICD-10-CM | POA: Diagnosis not present

## 2023-08-27 DIAGNOSIS — M9901 Segmental and somatic dysfunction of cervical region: Secondary | ICD-10-CM | POA: Diagnosis not present

## 2023-09-01 ENCOUNTER — Ambulatory Visit (INDEPENDENT_AMBULATORY_CARE_PROVIDER_SITE_OTHER): Payer: Medicare HMO | Admitting: Dermatology

## 2023-09-01 ENCOUNTER — Encounter: Payer: Self-pay | Admitting: Dermatology

## 2023-09-01 VITALS — BP 120/79

## 2023-09-01 DIAGNOSIS — L814 Other melanin hyperpigmentation: Secondary | ICD-10-CM

## 2023-09-01 DIAGNOSIS — Z1283 Encounter for screening for malignant neoplasm of skin: Secondary | ICD-10-CM | POA: Diagnosis not present

## 2023-09-01 DIAGNOSIS — W908XXA Exposure to other nonionizing radiation, initial encounter: Secondary | ICD-10-CM | POA: Diagnosis not present

## 2023-09-01 DIAGNOSIS — L918 Other hypertrophic disorders of the skin: Secondary | ICD-10-CM

## 2023-09-01 DIAGNOSIS — L578 Other skin changes due to chronic exposure to nonionizing radiation: Secondary | ICD-10-CM

## 2023-09-01 DIAGNOSIS — D229 Melanocytic nevi, unspecified: Secondary | ICD-10-CM

## 2023-09-01 DIAGNOSIS — L821 Other seborrheic keratosis: Secondary | ICD-10-CM | POA: Diagnosis not present

## 2023-09-01 DIAGNOSIS — D1801 Hemangioma of skin and subcutaneous tissue: Secondary | ICD-10-CM

## 2023-09-01 DIAGNOSIS — M67449 Ganglion, unspecified hand: Secondary | ICD-10-CM

## 2023-09-01 DIAGNOSIS — L820 Inflamed seborrheic keratosis: Secondary | ICD-10-CM | POA: Diagnosis not present

## 2023-09-01 MED ORDER — SAFETY SEAL MISCELLANEOUS MISC: 2 refills | Status: AC

## 2023-09-01 NOTE — Patient Instructions (Addendum)
 Date: Tue Sep 01 2023  Hello Caroline [name],  Thank you for visiting today. Here is a summary of the key instructions:  - Skin Care:   - Use sunscreen daily on face, neck, and chest   - Reapply sunscreen when outside for long periods   - Try the provided samples of moisturizers with sunscreen  - Medications:   - Apply new prescription cream twice a day for dark spots   - Cream contains transaminic acid, vitamin C, and kojic acid   - Use for at least one full year   - Expect to see results after 4 months  - Procedures:   - Keep band-aid on finger for 24 hours after digital mucus cyst treatment   - Use regular band-aid after 24 hours  - Follow-up:   - Return for follow-up in 6 months   - Schedule annual skin check-ups   - Come in sooner if you notice:     - Pimples that don't heal     - New spots in sun-exposed areas that last over a month     - Spots that bleed often     - New dark moles     - Changing moles that aren't crusty age spots  Please reach out if you have any questions or concerns.  Warm regards,  Dr. Louana Roup [facility]  Your provider has sent your prescription to [facility] in [place], [place] . A pharmacy representative will call you to confirm details and take your payment information. If you do not receive a call within 24 hours, please contact the pharmacy at [phone] or [phone]. Your unique skincare compound is being formulated in our lab (most compounds take less than 24 hours). Your prescription is shipped vis USPS to your mailbox (2-4 business days). Priority shipping is available at an additional cost. Once received, you will electronically sign/acknowledge that you received your prescription. The pharmacy hours are Monday-Friday 9 am-6 pm EST and Saturday 9 am-1 pm EST.       Cryotherapy Aftercare  Wash gently with soap and water  everyday.   Apply Vaseline and Band-Aid daily until healed.    Important Information  Due to  recent changes in healthcare laws, you may see results of your pathology and/or laboratory studies on MyChart before the doctors have had a chance to review them. We understand that in some cases there may be results that are confusing or concerning to you. Please understand that not all results are received at the same time and often the doctors may need to interpret multiple results in order to provide you with the best plan of care or course of treatment. Therefore, we ask that you please give us  2 business days to thoroughly review all your results before contacting the office for clarification. Should we see a critical lab result, you will be contacted sooner.   If You Need Anything After Your Visit  If you have any questions or concerns for your doctor, please call our main line at [phone] If no one answers, please leave a voicemail as directed and we will return your call as soon as possible. Messages left after 4 pm will be answered the following business day.   You may also send us  a message via MyChart. We typically respond to MyChart messages within 1-2 business days.  For prescription refills, please ask your pharmacy to contact our office. Our fax number is [phone].  If you have an urgent issue when the clinic is closed  that cannot wait until the next business day, you can page your doctor at the number below.    Please note that while we do our best to be available for urgent issues outside of office hours, we are not available 24/7.   If you have an urgent issue and are unable to reach us , you may choose to seek medical care at your doctor's office, retail clinic, urgent care center, or emergency room.  If you have a medical emergency, please immediately call 911 or go to the emergency department. In the event of inclement weather, please call our main line at [phone] for an update on the status of any delays or closures.  Dermatology Medication Tips: Please keep  the boxes that topical medications come in in order to help keep track of the instructions about where and how to use these. Pharmacies typically print the medication instructions only on the boxes and not directly on the medication tubes.   If your medication is too expensive, please contact our office at [phone] or send us  a message through MyChart.   We are unable to tell what your co-pay for medications will be in advance as this is different depending on your insurance coverage. However, we may be able to find a substitute medication at lower cost or fill out paperwork to get insurance to cover a needed medication.   If a prior authorization is required to get your medication covered by your insurance company, please allow us  1-2 business days to complete this process.  Drug prices often vary depending on where the prescription is filled and some pharmacies may offer cheaper prices.  The website www.goodrx.com contains coupons for medications through different pharmacies. The prices here do not account for what the cost may be with help from insurance (it may be cheaper with your insurance), but the website can give you the price if you did not use any insurance.  - You can print the associated coupon and take it with your prescription to the pharmacy.  - You may also stop by our office during regular business hours and pick up a GoodRx coupon card.  - If you need your prescription sent electronically to a different pharmacy, notify our office through [facility] or by phone at [phone]

## 2023-09-01 NOTE — Progress Notes (Signed)
   New Patient Visit   Subjective  Caroline [name] is a 73 y.o. female who presents for the following:  Total Body Skin Exam (TBSE)  Patient present today for new patient visit for TBSE.The patient reports she has spots, moles and lesions to be evaluated, some may be new or changing and the patient may have concern these could be cancer. Patient has previously been treated by a dermatologist.Patient reports she does not have hx of bx. Patient denied family history of skin cancers. Patient reports throughout her lifetime has had complete sun exposure. Currently, patient reports if she has excessive sun exposure, she does apply sunscreen and/or wears protective coverings.  The following portions of the chart were reviewed this encounter and updated as appropriate: medications, allergies, medical history  Review of Systems:  No other skin or systemic complaints except as noted in HPI or Assessment and Plan.  Objective  Well appearing patient in no apparent distress; mood and affect are within normal limits.  A full examination was performed including scalp, head, eyes, ears, nose, lips, neck, chest, axillae, abdomen, back, buttocks, bilateral upper extremities, bilateral lower extremities, hands, feet, fingers, toes, fingernails, and toenails. All findings within normal limits unless otherwise noted below.   Relevant exam findings are noted in the Assessment and Plan.           Assessment & Plan   LENTIGINES, SEBORRHEIC KERATOSES, HEMANGIOMAS - Benign normal skin lesions - Benign-appearing - Call for any changes  BENIGN MELANOCYTIC NEVI - Tan-brown and/or pink-flesh-colored symmetric macules and papules - Benign appearing on exam today - Observation - Call clinic for new or changing moles - Recommend daily use of broad spectrum spf 30+ sunscreen to sun-exposed areas.   MILD ACTINIC DAMAGE - Chronic condition, secondary to cumulative UV/sun exposure - diffuse scaly erythematous  macules with underlying dyspigmentation - Recommend daily broad spectrum sunscreen SPF 30+ to sun-exposed areas, reapply every 2 hours as needed.  - Staying in the shade or wearing long sleeves, sun glasses (UVA+UVB protection) and wide brim hats (4-inch brim around the entire circumference of the hat) are also recommended for sun protection.  - Call for new or changing lesions.  Acrochordons (Skin Tags) - Fleshy, skin-colored pedunculated papules - Benign appearing.  - Observe. - If desired, they can be removed with an in office procedure that is not covered by insurance. - Please call the clinic if you notice any new or changing lesions.   SKIN CANCER SCREENING PERFORMED TODAY  INFLAMED SEBORRHEIC KERATOSIS (5) Neck - Anterior (5) Destruction of lesion - Neck - Anterior (5) Complexity: simple   Destruction method: cryotherapy   Informed consent: discussed and consent obtained   Timeout:  patient name, date of birth, surgical site, and procedure verified Lesion destroyed using liquid nitrogen: Yes   Region frozen until ice ball extended beyond lesion: Yes   Outcome: patient tolerated procedure well with no complications   Post-procedure details: wound care instructions given     Return in about 1 year (around 08/31/2024) for TBSE.   Documentation: I have reviewed the above documentation for accuracy and completeness, and I agree with the above.  I, Shirron Louanne Roussel, CMA, am acting as scribe for [employer], DO.   Louana Roup, DO

## 2023-09-04 DIAGNOSIS — M9905 Segmental and somatic dysfunction of pelvic region: Secondary | ICD-10-CM | POA: Diagnosis not present

## 2023-09-04 DIAGNOSIS — M9903 Segmental and somatic dysfunction of lumbar region: Secondary | ICD-10-CM | POA: Diagnosis not present

## 2023-09-04 DIAGNOSIS — M9901 Segmental and somatic dysfunction of cervical region: Secondary | ICD-10-CM | POA: Diagnosis not present

## 2023-09-04 DIAGNOSIS — M9902 Segmental and somatic dysfunction of thoracic region: Secondary | ICD-10-CM | POA: Diagnosis not present

## 2023-09-08 DIAGNOSIS — G44209 Tension-type headache, unspecified, not intractable: Secondary | ICD-10-CM | POA: Diagnosis not present

## 2023-09-08 DIAGNOSIS — I10 Essential (primary) hypertension: Secondary | ICD-10-CM | POA: Diagnosis not present

## 2023-09-08 DIAGNOSIS — G43709 Chronic migraine without aura, not intractable, without status migrainosus: Secondary | ICD-10-CM | POA: Diagnosis not present

## 2023-09-08 DIAGNOSIS — E7800 Pure hypercholesterolemia, unspecified: Secondary | ICD-10-CM | POA: Diagnosis not present

## 2023-09-08 DIAGNOSIS — F3341 Major depressive disorder, recurrent, in partial remission: Secondary | ICD-10-CM | POA: Diagnosis not present

## 2023-09-10 DIAGNOSIS — M9903 Segmental and somatic dysfunction of lumbar region: Secondary | ICD-10-CM | POA: Diagnosis not present

## 2023-09-10 DIAGNOSIS — M9902 Segmental and somatic dysfunction of thoracic region: Secondary | ICD-10-CM | POA: Diagnosis not present

## 2023-09-10 DIAGNOSIS — M9901 Segmental and somatic dysfunction of cervical region: Secondary | ICD-10-CM | POA: Diagnosis not present

## 2023-09-10 DIAGNOSIS — M9905 Segmental and somatic dysfunction of pelvic region: Secondary | ICD-10-CM | POA: Diagnosis not present

## 2023-09-17 DIAGNOSIS — M9903 Segmental and somatic dysfunction of lumbar region: Secondary | ICD-10-CM | POA: Diagnosis not present

## 2023-09-17 DIAGNOSIS — M9901 Segmental and somatic dysfunction of cervical region: Secondary | ICD-10-CM | POA: Diagnosis not present

## 2023-09-17 DIAGNOSIS — M9905 Segmental and somatic dysfunction of pelvic region: Secondary | ICD-10-CM | POA: Diagnosis not present

## 2023-09-17 DIAGNOSIS — M9902 Segmental and somatic dysfunction of thoracic region: Secondary | ICD-10-CM | POA: Diagnosis not present

## 2023-09-24 DIAGNOSIS — M9905 Segmental and somatic dysfunction of pelvic region: Secondary | ICD-10-CM | POA: Diagnosis not present

## 2023-09-24 DIAGNOSIS — M9903 Segmental and somatic dysfunction of lumbar region: Secondary | ICD-10-CM | POA: Diagnosis not present

## 2023-09-24 DIAGNOSIS — M9901 Segmental and somatic dysfunction of cervical region: Secondary | ICD-10-CM | POA: Diagnosis not present

## 2023-09-24 DIAGNOSIS — M9902 Segmental and somatic dysfunction of thoracic region: Secondary | ICD-10-CM | POA: Diagnosis not present

## 2023-09-25 ENCOUNTER — Ambulatory Visit
Admission: RE | Admit: 2023-09-25 | Discharge: 2023-09-25 | Disposition: A | Discharge status: Home / Self Care | Source: Ambulatory Visit | Attending: Family Medicine | Admitting: Family Medicine

## 2023-09-25 DIAGNOSIS — N641 Fat necrosis of breast: Secondary | ICD-10-CM | POA: Diagnosis not present

## 2023-10-01 DIAGNOSIS — M9903 Segmental and somatic dysfunction of lumbar region: Secondary | ICD-10-CM | POA: Diagnosis not present

## 2023-10-01 DIAGNOSIS — M9901 Segmental and somatic dysfunction of cervical region: Secondary | ICD-10-CM | POA: Diagnosis not present

## 2023-10-01 DIAGNOSIS — M9905 Segmental and somatic dysfunction of pelvic region: Secondary | ICD-10-CM | POA: Diagnosis not present

## 2023-10-01 DIAGNOSIS — M9902 Segmental and somatic dysfunction of thoracic region: Secondary | ICD-10-CM | POA: Diagnosis not present

## 2023-10-12 NOTE — Progress Notes (Deleted)
 Caroline [name], female    DOB: July 26, 1950   MRN: [id number]   Brief patient profile:  ***  *** referred to pulmonary clinic 10/13/2023 by *** for ***      Pt not previously seen by PCCM service.    History of Present Illness  10/13/2023  Pulmonary/ 1st office eval/Gill Delrossi  No chief complaint on file.    Dyspnea:  *** Cough: *** Sleep: *** SABA use: *** 02 use:*** LDSCT:***  No obvious day to day or daytime pattern/variability or assoc excess/ purulent sputum or mucus plugs or hemoptysis or cp or chest tightness, subjective wheeze or overt sinus or hb symptoms.    Also denies any obvious fluctuation of symptoms with weather or environmental changes or other aggravating or alleviating factors except as outlined above   No unusual exposure hx or h/o childhood pna/ asthma or knowledge of premature birth.  Current Allergies, Complete Past Medical History, Past Surgical History, Family History, and Social History were reviewed in [employer] record.  ROS  The following are not active complaints unless bolded Hoarseness, sore throat, dysphagia, dental problems, itching, sneezing,  nasal congestion or discharge of excess mucus or purulent secretions, ear ache,   fever, chills, sweats, unintended wt loss or wt gain, classically pleuritic or exertional cp,  orthopnea pnd or arm/hand swelling  or leg swelling, presyncope, palpitations, abdominal pain, anorexia, nausea, vomiting, diarrhea  or change in bowel habits or change in bladder habits, change in stools or change in urine, dysuria, hematuria,  rash, arthralgias, visual complaints, headache, numbness, weakness or ataxia or problems with walking or coordination,  change in mood or  memory.             Outpatient Medications Prior to Visit  Medication Sig Dispense Refill   acetaminophen  (TYLENOL ) 325 MG tablet Take 1-2 tablets (325-650 mg total) by mouth every 6 (six) hours as needed for mild pain (pain score 1-3 or temp >  100.5). 60 tablet 0   albuterol  (VENTOLIN  HFA) 108 (90 Base) MCG/ACT inhaler Inhale 2 puffs into the lungs every 6 (six) hours as needed for wheezing or shortness of breath. 8 g 3   amoxicillin  (AMOXIL ) 500 MG tablet Take 2g 1 hour prior to dental procedure 10 tablet 0   cholecalciferol (VITAMIN D3) 25 MCG (1000 UNIT) tablet 1 tablet Orally Once a day     diclofenac Sodium (VOLTAREN) 1 % GEL Apply 2 g topically daily as needed (achillles pain).     famotidine  (PEPCID ) 20 MG tablet Take 1 tablet (20 mg total) by mouth 2 (two) times daily. 60 tablet 3   fluticasone  (FLONASE ) 50 MCG/ACT nasal spray Place 1 spray into both nostrils 2 (two) times daily. (Patient taking differently: Place 1 spray into both nostrils daily as needed for allergies.) 16 g 6   hydroquinone  4 % cream Apply topically at bedtime. Apply to affected areas of face at bedtime for 4 months. 28.35 g 2   metoprolol  succinate (TOPROL -XL) 100 MG 24 hr tablet TAKE 1 TABLET EVERY DAY. TAKE WITH OR IMMEDIATELY FOLLOWING A MEAL. 90 tablet 3   ondansetron  (ZOFRAN ) 8 MG tablet Take 1 tablet (8 mg total) by mouth every 8 (eight) hours as needed for nausea or vomiting. 30 tablet 5   rosuvastatin (CRESTOR) 10 MG tablet Take 10 mg by mouth at bedtime.     Safety Seal Miscellaneous MISC MELAXAMIC CREAM WITH TRANEXAMIC ACID  5% KOJIC ACID USP 2% VIT C USP 2.5% HYALURONIC ACID EXCP  0.1% - apply to affected areas BID for 4 months. 15 g 2   sertraline  (ZOLOFT ) 100 MG tablet TAKE 1 AND 1/2 TABLETS EVERY DAY (Patient taking differently: Take 200 mg by mouth daily. TAKE 1 AND 1/2 TABLETS EVERY DAY) 135 tablet 3   SUMAtriptan  (IMITREX ) 100 MG tablet Take 1 tablet (100 mg total) by mouth once as needed for migraine. May repeat x 1 after 2 hours; maximum 2 tabs per day and 8 tabs per month 8 tablet 6   SYMBICORT 80-4.5 MCG/ACT inhaler Inhale 2 puffs into the lungs 2 (two) times daily as needed (shortness of breath).     vitamin B-12 (CYANOCOBALAMIN) 500 MCG  tablet Take 1,000 mcg by mouth daily.     No facility-administered medications prior to visit.    Past Medical History:  Diagnosis Date   Allergy    Zyrtec, Benadryl    Anxiety    Zoloft  since several years   Asthma    Balance problem    Cataract    Complication of anesthesia    GERD (gastroesophageal reflux disease)    Hyperlipidemia    Hypertension    Memory changes    Migraines    topmax and Imitrex       Objective:     There were no vitals taken for this visit.         Assessment   No problem-specific Assessment & Plan notes found for this encounter.     Ozell America, MD 10/12/2023

## 2023-10-13 ENCOUNTER — Ambulatory Visit: Admitting: Internal Medicine

## 2023-10-15 DIAGNOSIS — M9901 Segmental and somatic dysfunction of cervical region: Secondary | ICD-10-CM | POA: Diagnosis not present

## 2023-10-15 DIAGNOSIS — M9905 Segmental and somatic dysfunction of pelvic region: Secondary | ICD-10-CM | POA: Diagnosis not present

## 2023-10-15 DIAGNOSIS — M9903 Segmental and somatic dysfunction of lumbar region: Secondary | ICD-10-CM | POA: Diagnosis not present

## 2023-10-15 DIAGNOSIS — M9902 Segmental and somatic dysfunction of thoracic region: Secondary | ICD-10-CM | POA: Diagnosis not present

## 2023-10-22 DIAGNOSIS — M9903 Segmental and somatic dysfunction of lumbar region: Secondary | ICD-10-CM | POA: Diagnosis not present

## 2023-10-22 DIAGNOSIS — M9902 Segmental and somatic dysfunction of thoracic region: Secondary | ICD-10-CM | POA: Diagnosis not present

## 2023-10-22 DIAGNOSIS — M9905 Segmental and somatic dysfunction of pelvic region: Secondary | ICD-10-CM | POA: Diagnosis not present

## 2023-10-22 DIAGNOSIS — M9901 Segmental and somatic dysfunction of cervical region: Secondary | ICD-10-CM | POA: Diagnosis not present

## 2023-10-29 DIAGNOSIS — M9903 Segmental and somatic dysfunction of lumbar region: Secondary | ICD-10-CM | POA: Diagnosis not present

## 2023-10-29 DIAGNOSIS — M9905 Segmental and somatic dysfunction of pelvic region: Secondary | ICD-10-CM | POA: Diagnosis not present

## 2023-10-29 DIAGNOSIS — M9902 Segmental and somatic dysfunction of thoracic region: Secondary | ICD-10-CM | POA: Diagnosis not present

## 2023-10-29 DIAGNOSIS — M9901 Segmental and somatic dysfunction of cervical region: Secondary | ICD-10-CM | POA: Diagnosis not present

## 2023-11-05 DIAGNOSIS — M9903 Segmental and somatic dysfunction of lumbar region: Secondary | ICD-10-CM | POA: Diagnosis not present

## 2023-11-05 DIAGNOSIS — M9905 Segmental and somatic dysfunction of pelvic region: Secondary | ICD-10-CM | POA: Diagnosis not present

## 2023-11-05 DIAGNOSIS — M9901 Segmental and somatic dysfunction of cervical region: Secondary | ICD-10-CM | POA: Diagnosis not present

## 2023-11-05 DIAGNOSIS — M9902 Segmental and somatic dysfunction of thoracic region: Secondary | ICD-10-CM | POA: Diagnosis not present

## 2023-11-12 DIAGNOSIS — M9901 Segmental and somatic dysfunction of cervical region: Secondary | ICD-10-CM | POA: Diagnosis not present

## 2023-11-12 DIAGNOSIS — M9905 Segmental and somatic dysfunction of pelvic region: Secondary | ICD-10-CM | POA: Diagnosis not present

## 2023-11-12 DIAGNOSIS — M9902 Segmental and somatic dysfunction of thoracic region: Secondary | ICD-10-CM | POA: Diagnosis not present

## 2023-11-12 DIAGNOSIS — M9903 Segmental and somatic dysfunction of lumbar region: Secondary | ICD-10-CM | POA: Diagnosis not present

## 2023-11-23 DIAGNOSIS — M9901 Segmental and somatic dysfunction of cervical region: Secondary | ICD-10-CM | POA: Diagnosis not present

## 2023-11-23 DIAGNOSIS — M9903 Segmental and somatic dysfunction of lumbar region: Secondary | ICD-10-CM | POA: Diagnosis not present

## 2023-11-23 DIAGNOSIS — M9905 Segmental and somatic dysfunction of pelvic region: Secondary | ICD-10-CM | POA: Diagnosis not present

## 2023-11-23 DIAGNOSIS — M9902 Segmental and somatic dysfunction of thoracic region: Secondary | ICD-10-CM | POA: Diagnosis not present

## 2023-11-26 ENCOUNTER — Ambulatory Visit: Admitting: Internal Medicine

## 2023-11-26 ENCOUNTER — Other Ambulatory Visit: Payer: Self-pay

## 2023-11-26 ENCOUNTER — Other Ambulatory Visit (HOSPITAL_BASED_OUTPATIENT_CLINIC_OR_DEPARTMENT_OTHER): Payer: Self-pay

## 2023-11-26 ENCOUNTER — Encounter (HOSPITAL_BASED_OUTPATIENT_CLINIC_OR_DEPARTMENT_OTHER): Payer: Self-pay | Admitting: Emergency Medicine

## 2023-11-26 ENCOUNTER — Emergency Department (HOSPITAL_BASED_OUTPATIENT_CLINIC_OR_DEPARTMENT_OTHER)
Admission: EM | Admit: 2023-11-26 | Discharge: 2023-11-26 | Disposition: A | Discharge status: Home / Self Care | Attending: Emergency Medicine | Admitting: Emergency Medicine

## 2023-11-26 DIAGNOSIS — R519 Headache, unspecified: Secondary | ICD-10-CM | POA: Diagnosis present

## 2023-11-26 DIAGNOSIS — G4489 Other headache syndrome: Secondary | ICD-10-CM | POA: Insufficient documentation

## 2023-11-26 MED ORDER — PROCHLORPERAZINE EDISYLATE 10 MG/2ML IJ SOLN
10.0000 mg | Freq: Once | INTRAMUSCULAR | Status: AC
  Administered 2023-11-26: 10 mg via INTRAVENOUS
  Filled 2023-11-26: qty 2

## 2023-11-26 MED ORDER — KETOROLAC TROMETHAMINE 15 MG/ML IJ SOLN
15.0000 mg | Freq: Once | INTRAMUSCULAR | Status: AC
Start: 2023-11-26 — End: 2023-11-26
  Administered 2023-11-26: 15 mg via INTRAVENOUS
  Filled 2023-11-26: qty 1

## 2023-11-26 MED ORDER — ONDANSETRON 4 MG PO TBDP
4.0000 mg | ORAL_TABLET | Freq: Three times a day (TID) | ORAL | 0 refills | Status: AC | PRN
Start: 2023-11-26 — End: ?
  Filled 2023-11-26: qty 20, 7d supply, fill #0

## 2023-11-26 MED ORDER — KETOROLAC TROMETHAMINE 15 MG/ML IJ SOLN
15.0000 mg | Freq: Once | INTRAMUSCULAR | Status: AC
  Administered 2023-11-26: 15 mg via INTRAVENOUS
  Filled 2023-11-26: qty 1

## 2023-11-26 MED ORDER — SODIUM CHLORIDE 0.9 % IV BOLUS
1000.0000 mL | Freq: Once | INTRAVENOUS | Status: AC
  Administered 2023-11-26: 1000 mL via INTRAVENOUS

## 2023-11-26 NOTE — Discharge Instructions (Signed)
 As we discussed, you can take your sumatriptan  tonight if your headache is still moderate to severe.  Can also take Benadryl  when you get home.  We given you an ambulatory referral to neurology and they should call you to schedule an appointment.  You may return to the emergency department for any worsening symptoms.

## 2023-11-26 NOTE — ED Provider Notes (Signed)
 [place] EMERGENCY DEPARTMENT AT [facility] Provider Note   CSN: [id number] Arrival date & time: 11/26/23  1107     Patient presents with: Headache   Caroline [name] is a 73 y.o. female patient with history of migraines who presents to the emergency department today for 3 days worth of headache.  She states this is relatively typical for her migraines however its lasted a little bit longer and a little bit worse in severity.  It is in the same location on the right side of the head and feels similar.  She reports associated photophobia and phonophobia.  Denies any visual disturbances, focal weakness or numbness.  She is taken her sumatriptan  which typically aborts her headaches in about 45 minutes or less.  This has not been effective this time.    Headache      Prior to Admission medications   Medication Sig Start Date End Date Taking? Authorizing Provider  acetaminophen  (TYLENOL ) 325 MG tablet Take 1-2 tablets (325-650 mg total) by mouth every 6 (six) hours as needed for mild pain (pain score 1-3 or temp > 100.5). 10/02/21   Magnant, Carlin CROME, PA-C  albuterol  (VENTOLIN  HFA) 108 (90 Base) MCG/ACT inhaler Inhale 2 puffs into the lungs every 6 (six) hours as needed for wheezing or shortness of breath. 11/17/19   Melonie Tori Mikel CHRISTELLA, MD  buPROPion  (WELLBUTRIN  XL) 150 MG 24 hr tablet Take 150 mg by mouth in the morning.    [provider]  cholecalciferol (VITAMIN D3) 25 MCG (1000 UNIT) tablet 1 tablet Orally Once a day    [provider]  diclofenac Sodium (VOLTAREN) 1 % GEL Apply 2 g topically daily as needed (achillles pain).    [provider]  famotidine  (PEPCID ) 20 MG tablet Take 1 tablet (20 mg total) by mouth 2 (two) times daily. 11/17/19   Melonie Tori Mikel CHRISTELLA, MD  fluticasone  (FLONASE ) 50 MCG/ACT nasal spray Place 1 spray into both nostrils 2 (two) times daily. Patient taking differently: Place 1 spray into both nostrils daily as needed for  allergies. 03/24/18   Melonie Tori Mikel CHRISTELLA, MD  hydroquinone  4 % cream Apply topically at bedtime. Apply to affected areas of face at bedtime for 4 months. 01/22/23   Alm Delon SAILOR, DO  levocetirizine (XYZAL) 5 MG tablet Take 5 mg by mouth daily.    [provider]  metoprolol  succinate (TOPROL -XL) 100 MG 24 hr tablet TAKE 1 TABLET EVERY DAY. TAKE WITH OR IMMEDIATELY FOLLOWING A MEAL. 10/27/19   Melonie Tori, Mikel CHRISTELLA, MD  montelukast (SINGULAIR) 10 MG tablet Take 10 mg by mouth daily.    [provider]  ondansetron  (ZOFRAN ) 8 MG tablet Take 1 tablet (8 mg total) by mouth every 8 (eight) hours as needed for nausea or vomiting. 03/29/20   Skeet Juliene SAUNDERS, DO  rosuvastatin (CRESTOR) 10 MG tablet Take 10 mg by mouth at bedtime. 06/24/21   [provider]  Safety Seal Miscellaneous MISC MELAXAMIC CREAM WITH TRANEXAMIC ACID  5% KOJIC ACID USP 2% VIT C USP 2.5% HYALURONIC ACID EXCP 0.1% - apply to affected areas BID for 4 months. 09/01/23   Alm Delon SAILOR, DO  sertraline  (ZOLOFT ) 100 MG tablet TAKE 1 AND 1/2 TABLETS EVERY DAY Patient taking differently: Take 200 mg by mouth daily. TAKE 1 AND 1/2 TABLETS EVERY DAY 10/27/19   Melonie Tori, Mikel CHRISTELLA, MD  SUMAtriptan  (IMITREX ) 100 MG tablet Take 1 tablet (100 mg total) by mouth once as needed  for migraine. May repeat x 1 after 2 hours; maximum 2 tabs per day and 8 tabs per month 02/17/22   Penumalli, Vikram R, MD  SYMBICORT 80-4.5 MCG/ACT inhaler Inhale 2 puffs into the lungs 2 (two) times daily as needed (shortness of breath). 03/26/20   [provider]  vitamin B-12 (CYANOCOBALAMIN) 500 MCG tablet Take 1,000 mcg by mouth daily.    [provider]    Allergies: Anesthesia s-i-40 [propofol ], Hydromorphone , Oxycontin [oxycodone hcl], Tetracyclines & related, and Vicodin [hydrocodone-acetaminophen ]    Review of Systems  Neurological:  Positive for headaches.  All other systems reviewed and are  negative.   Updated Vital Signs BP (!) 145/105 (BP Location: Right Arm)   Pulse 92   Temp 98.5 F (36.9 C) (Oral)   Resp 18   Ht 5' 1 (1.549 m)   Wt 65.8 kg   SpO2 99%   BMI 27.40 kg/m   Physical Exam Vitals and nursing note reviewed.  Constitutional:      General: She is not in acute distress.    Appearance: Normal appearance.  HENT:     Head: Normocephalic and atraumatic.  Eyes:     General:        Right eye: No discharge.        Left eye: No discharge.  Cardiovascular:     Comments: Regular rate and rhythm.  S1/S2 are distinct without any evidence of murmur, rubs, or gallops.  Radial pulses are 2+ bilaterally.  Dorsalis pedis pulses are 2+ bilaterally.  No evidence of pedal edema. Pulmonary:     Comments: Clear to auscultation bilaterally.  Normal effort.  No respiratory distress.  No evidence of wheezes, rales, or rhonchi heard throughout. Abdominal:     General: Abdomen is flat. Bowel sounds are normal. There is no distension.     Tenderness: There is no abdominal tenderness. There is no guarding or rebound.  Musculoskeletal:        General: Normal range of motion.     Cervical back: Neck supple.  Skin:    General: Skin is warm and dry.     Findings: No rash.  Neurological:     General: No focal deficit present.     Mental Status: She is alert.     Comments: Cranial nerves II to XII are intact.  5/5 strength to the upper and lower extremities.  Normal sensation to the upper and lower extremities.  Speech is normal.  Extraocular ocular movements are intact without any obvious nystagmus.  Answers all questions appropriately.  Psychiatric:        Mood and Affect: Mood normal.        Behavior: Behavior normal.     (all labs ordered are listed, but only abnormal results are displayed) Labs Reviewed - No data to display  EKG: None  Radiology: No results found.   Procedures   Medications Ordered in the ED  ketorolac  (TORADOL ) 15 MG/ML injection 15 mg (has  no administration in time range)  sodium chloride  0.9 % bolus 1,000 mL (1,000 mLs Intravenous New Bag/Given 11/26/23 1203)  ketorolac  (TORADOL ) 15 MG/ML injection 15 mg (15 mg Intravenous Given 11/26/23 1203)  prochlorperazine  (COMPAZINE ) injection 10 mg (10 mg Intravenous Given 11/26/23 1204)    Clinical Course as of 11/26/23 1406  Thu Nov 26, 2023  1326 I rechecked the patient and she was actively sleeping in the emergency department.  Will plan to reassess again here in the next 10 or so minutes. [  CF]  1404 Patient states her headache is significantly improved.  She did start to have headache in which he went up to get to the bathroom.  Will plan to give her another round of Toradol  and plan to discharge.  Patient agreeable with plan. [CF]    Clinical Course User Index [CF] Theotis Cameron HERO, PA-C    Medical Decision Making Alazia Crocket is a 73 y.o. female patient who presents to the emergency department today for further evaluation of migraine.  This seems relatively typical for her migraine pattern based on history.  Given her normal neurological exam, do not feel strongly about imaging at this time.  She did have a normal MRI back in 2018.  Will plan to give her migraine cocktail but will hold off on Benadryl  as she did have a poor reaction to this last time.  Patient's headache is actively improving.  Again, no neurological symptoms at this time.  Will have her follow-up with neurology.  Ambulatory referral placed.  Strict return precautions were discussed.  She is safe for discharge.  Risk Prescription drug management.     Final diagnoses:  Other headache syndrome    ED Discharge Orders          Ordered    Ambulatory referral to Neurology       Comments: An appointment is requested in approximately: 2 weeks   11/26/23 1215               Theotis Cameron [place], [place] 11/26/23 1406    Midge Golas, MD 11/26/23 [phone]

## 2023-11-26 NOTE — ED Triage Notes (Signed)
 Pt arrived with c/o migraine and nausea for 3 days. Hx migraines

## 2023-11-26 NOTE — ED Notes (Signed)

## 2023-12-03 ENCOUNTER — Encounter: Payer: Self-pay | Admitting: Diagnostic Neuroimaging

## 2023-12-03 ENCOUNTER — Other Ambulatory Visit: Payer: Self-pay | Admitting: Diagnostic Neuroimaging

## 2023-12-03 ENCOUNTER — Ambulatory Visit: Admitting: Diagnostic Neuroimaging

## 2023-12-03 VITALS — BP 136/88 | HR 66 | Ht 61.0 in | Wt 144.2 lb

## 2023-12-03 DIAGNOSIS — M9905 Segmental and somatic dysfunction of pelvic region: Secondary | ICD-10-CM | POA: Diagnosis not present

## 2023-12-03 DIAGNOSIS — R519 Headache, unspecified: Secondary | ICD-10-CM

## 2023-12-03 DIAGNOSIS — M9903 Segmental and somatic dysfunction of lumbar region: Secondary | ICD-10-CM | POA: Diagnosis not present

## 2023-12-03 DIAGNOSIS — M9902 Segmental and somatic dysfunction of thoracic region: Secondary | ICD-10-CM | POA: Diagnosis not present

## 2023-12-03 DIAGNOSIS — M9901 Segmental and somatic dysfunction of cervical region: Secondary | ICD-10-CM | POA: Diagnosis not present

## 2023-12-03 DIAGNOSIS — G43E09 Chronic migraine with aura, not intractable, without status migrainosus: Secondary | ICD-10-CM

## 2023-12-03 MED ORDER — NURTEC 75 MG PO TBDP: 75.0000 mg | ORAL_TABLET | Freq: Every day | ORAL | 6 refills | Status: DC | PRN

## 2023-12-03 MED ORDER — QULIPTA 60 MG PO TABS: 60.0000 mg | ORAL_TABLET | Freq: Every day | ORAL | 6 refills | Status: AC

## 2023-12-03 MED ORDER — RIZATRIPTAN BENZOATE 10 MG PO TBDP: 10.0000 mg | ORAL_TABLET | ORAL | 11 refills | Status: AC | PRN

## 2023-12-03 NOTE — Patient Instructions (Addendum)
  WORSENING HEADACHES (since spring 2025) - check MRI brain w/wo   MIGRAINE TREATMENT PLAN:  MIGRAINE PREVENTION  LIFESTYLE CHANGES -Stop or avoid smoking -Decrease or avoid caffeine  / alcohol -Eat and sleep on a regular schedule -Exercise several times per week - start atogepant  (Qulipta ) 60mg  daily   MIGRAINE RESCUE  - ibuprofen, tylenol  as needed - start rizatriptan  (Maxalt ) 10mg  as needed for breakthrough headache; may repeat x 1 after 2 hours; max 2 tabs per day or 8 per month - start rimegepant (Nurtec) 75mg  as needed for breakthrough headache; max 8 per month

## 2023-12-03 NOTE — Progress Notes (Signed)
 [facility]  PATIENT: Caroline [name] DOB: [birth year]  REFERRING CLINICIAN: Midge Golas, MD HISTORY FROM: patient REASON FOR VISIT: follow up   HISTORICAL  CHIEF COMPLAINT:  Chief Complaint  Patient presents with   Headache    RM 6, Pt alone, referred by ED for headaches. Pt had ED visit on 8/21 d/t headache. Pt last seen by you in May 2023 for migraines.    HISTORY OF PRESENT ILLNESS:   UPDATE (12/01/23, VRP): Since last visit, doing well until spring 2025, and now with increased headaches. Similar quality,  but increased freq. Sens to light, sens to sound, nausea, right sided HA, throbbing, visual distortions.   UPDATE (02/17/22, VRP): Since last visit, doing well. 3-4 HA per month. Improved on their own. HA are stress triggered. Memory loss is better.   PRIOR HPI (08/12/21): 73 year old female here for evaluation of migraines, gait and balance difficulty, memory loss.  Regarding migraines patient has had headaches with right-sided pain, nausea, seeing spots, sensitive light and sound, sensitive to smells, since teenage years.  Triggering factors include stress and tension, red wine and tequila.  Sometimes skipping meals also will trigger headaches.  Patient has tried sumatriptan  in the past which seems to help.  She is averaging 4 to 12 days of migraine per month.  Other medicines tried include Excedrin, tramadol , Nurtec, Toprol , sertraline , Wellbutrin  and topiramate  and zonisamide .  Also having some issues with short-term memory problems and decreased attention.  No major change in ADLs.  She is under severe stress related to husband's health and other factors.  Also with chronic anxiety and depression issues on medications and counseling treatments.  Also with some intermittent gait stability since 2015, when she started to have knee and ankle problems.  Left knee and right ankle are affected.  She has had some falls.  She has been less active than before.     REVIEW OF SYSTEMS: Full 14 system review of systems performed and negative with exception of: as per HPI.  ALLERGIES: Allergies  Allergen Reactions   Anesthesia S-I-40 [Propofol ] Other (See Comments)    Causing a migraine and irritability   Hydromorphone      Other Reaction(s): out of it/hallucinating   Oxycontin [Oxycodone Hcl]     Causes migraines   Tetracyclines & Related Nausea And Vomiting   Vicodin [Hydrocodone-Acetaminophen ] Nausea And Vomiting    Dizzy, causes migraines    HOME MEDICATIONS: Outpatient Medications Prior to Visit  Medication Sig Dispense Refill   acetaminophen  (TYLENOL ) 325 MG tablet Take 1-2 tablets (325-650 mg total) by mouth every 6 (six) hours as needed for mild pain (pain score 1-3 or temp > 100.5). 60 tablet 0   albuterol  (VENTOLIN  HFA) 108 (90 Base) MCG/ACT inhaler Inhale 2 puffs into the lungs every 6 (six) hours as needed for wheezing or shortness of breath. 8 g 3   cholecalciferol (VITAMIN D3) 25 MCG (1000 UNIT) tablet 1 tablet Orally Once a day     diclofenac Sodium (VOLTAREN) 1 % GEL Apply 2 g topically daily as needed (achillles pain).     famotidine  (PEPCID ) 20 MG tablet Take 1 tablet (20 mg total) by mouth 2 (two) times daily. 60 tablet 3   fluticasone  (FLONASE ) 50 MCG/ACT nasal spray Place 1 spray into both nostrils 2 (two) times daily. (Patient taking differently: Place 1 spray into both nostrils daily as needed for allergies.) 16 g 6   hydroquinone  4 % cream Apply topically at bedtime. Apply to affected areas of  face at bedtime for 4 months. 28.35 g 2   levocetirizine (XYZAL) 5 MG tablet Take 5 mg by mouth daily.     metoprolol  succinate (TOPROL -XL) 100 MG 24 hr tablet TAKE 1 TABLET EVERY DAY. TAKE WITH OR IMMEDIATELY FOLLOWING A MEAL. 90 tablet 3   montelukast (SINGULAIR) 10 MG tablet Take 10 mg by mouth daily.     ondansetron  (ZOFRAN ) 8 MG tablet Take 1 tablet (8 mg total) by mouth every 8 (eight) hours as needed for nausea or vomiting. 30  tablet 5   ondansetron  (ZOFRAN -ODT) 4 MG disintegrating tablet Take 1 tablet (4 mg total) by mouth every 8 (eight) hours as needed for nausea or vomiting. 20 tablet 0   rosuvastatin (CRESTOR) 10 MG tablet Take 10 mg by mouth at bedtime.     sertraline  (ZOLOFT ) 100 MG tablet TAKE 1 AND 1/2 TABLETS EVERY DAY (Patient taking differently: Take 200 mg by mouth daily. TAKE 1 AND 1/2 TABLETS EVERY DAY) 135 tablet 3   SYMBICORT 80-4.5 MCG/ACT inhaler Inhale 2 puffs into the lungs 2 (two) times daily as needed (shortness of breath).     vitamin B-12 (CYANOCOBALAMIN) 500 MCG tablet Take 1,000 mcg by mouth daily.     SUMAtriptan  (IMITREX ) 100 MG tablet Take 1 tablet (100 mg total) by mouth once as needed for migraine. May repeat x 1 after 2 hours; maximum 2 tabs per day and 8 tabs per month 8 tablet 6   Safety Seal Miscellaneous MISC MELAXAMIC CREAM WITH TRANEXAMIC ACID  5% KOJIC ACID USP 2% VIT C USP 2.5% HYALURONIC ACID EXCP 0.1% - apply to affected areas BID for 4 months. (Patient not taking: Reported on 12/03/2023) 15 g 2   buPROPion  (WELLBUTRIN  XL) 150 MG 24 hr tablet Take 150 mg by mouth in the morning.     No facility-administered medications prior to visit.    PAST MEDICAL HISTORY: Past Medical History:  Diagnosis Date   Allergy    Zyrtec, Benadryl    Anxiety    Zoloft  since several years   Asthma    Balance problem    Cataract    Complication of anesthesia    GERD (gastroesophageal reflux disease)    Hyperlipidemia    Hypertension    Memory changes    Migraines    topmax and Imitrex     PAST SURGICAL HISTORY: Past Surgical History:  Procedure Laterality Date   ABDOMINAL HYSTERECTOMY  2016   uterine prolapse; ovaries INTACT   BLADDER REPAIR     BREAST BIOPSY     NL   BREAST EXCISIONAL BIOPSY     BREAST SURGERY     breast bx x 2 in 1980s; benign   CARPAL TUNNEL RELEASE Bilateral    CATARACT EXTRACTION, BILATERAL  2018   TOTAL KNEE ARTHROPLASTY Left 10/01/2021   Procedure: LEFT  TOTAL KNEE ARTHROPLASTY;  Surgeon: Addie Cordella Hamilton, MD;  Location: [facility] OR;  Service: Orthopedics;  Laterality: Left;    FAMILY HISTORY: Family History  Problem Relation Age of Onset   Hypertension Mother    Stroke Mother    Dementia Mother    Cancer Father 61       oral cancer   Heart disease Father        PAD/femoral bypass   Hypertension Father    Hyperlipidemia Father    Alcohol abuse Father    Hyperlipidemia Sister    Hypertension Sister    Breast cancer Maternal Aunt     SOCIAL HISTORY: Social History  Socioeconomic History   Marital status: Married    Spouse name: Ozell   Number of children: 2   Years of education: Not on file   Highest education level: 12th grade  Occupational History   Occupation: employed  Tobacco Use   Smoking status: Former    Current packs/day: 0.00    Types: Cigarettes    Quit date: 04/07/1968    Years since quitting: 55.6    Passive exposure: Never   Smokeless tobacco: Never  Vaping Use   Vaping status: Never Used  Substance and Sexual Activity   Alcohol use: Yes    Comment: rarely wine   Drug use: No   Sexual activity: Yes    Birth control/protection: Post-menopausal, Surgical  Other Topics Concern   Not on file  Social History Narrative   Marital status: married x 45 years; from [place]       Children: 2 children (one in [place]; one in [place] ); 2 grandchildren      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      Alcohol: rare      Exercise: none      Patient is right-handed. She lives with her husband in one level home. She rarely drinks caffeine . She is active at work.   Social Drivers of Corporate investment banker Strain: Not on file  Food Insecurity: Not on file  Transportation Needs: Not on file  Physical Activity: Not on file  Stress: Not on file  Social Connections: Not on file  Intimate Partner Violence: Not on file      PHYSICAL EXAM  GENERAL EXAM/CONSTITUTIONAL: Vitals:  Vitals:   12/03/23 1502  BP: 136/88  Pulse: 66  Weight: 144 lb 3.2 oz (65.4 kg)  Height: 5' 1 (1.549 m)    Body mass index is 27.25 kg/m. Wt Readings from Last 3 Encounters:  12/03/23 144 lb 3.2 oz (65.4 kg)  11/26/23 145 lb (65.8 kg)  10/01/21 140 lb 14.4 oz (63.9 kg)   Patient is in no distress; well developed, nourished and groomed; neck is supple  CARDIOVASCULAR: Examination of carotid arteries is normal; no carotid bruits Regular rate and rhythm, no murmurs Examination of peripheral vascular system by observation and palpation is normal  EYES: Ophthalmoscopic exam of optic discs and posterior segments is normal; no papilledema or hemorrhages No results found.  MUSCULOSKELETAL: Gait, strength, tone, movements noted in Neurologic exam below  NEUROLOGIC: MENTAL STATUS:     08/12/2021   10:23 AM  MMSE - Mini Mental State Exam  Orientation to time 5  Orientation to Place 4  Registration 3  Attention/ Calculation 4  Recall 3  Language- name 2 objects 2  Language- repeat 1  Language- follow 3 step command 3  Language- read & follow direction 1  Write a sentence 1  Copy design 1  Total score 28   awake, alert, oriented to person, place and time recent and remote memory intact normal attention and concentration language fluent, comprehension intact, naming intact fund of knowledge appropriate  CRANIAL NERVE:  2nd - no papilledema on fundoscopic exam 2nd, 3rd, 4th, 6th - pupils equal and reactive to light, visual fields full to confrontation, extraocular muscles intact, no nystagmus 5th - facial sensation symmetric 7th - facial strength symmetric 8th - hearing intact 9th - palate elevates symmetrically, uvula midline 11th - shoulder shrug symmetric 12th - tongue protrusion midline  MOTOR:  normal bulk and tone, full strength in the BUE, BLE  SENSORY:  normal and symmetric to light touch,  temperature, vibration  COORDINATION:  finger-nose-finger, fine finger movements normal  REFLEXES:  deep tendon reflexes TRACE and symmetric  GAIT/STATION:  narrow based gait; SLIGHTLY CAUTIOUS AND SLOW     DIAGNOSTIC DATA (LABS, IMAGING, TESTING) - I reviewed patient records, labs, notes, testing and imaging myself where available.  Lab Results  Component Value Date   WBC 6.5 10/06/2021   HGB 8.1 (L) 10/06/2021   HCT 24.8 (L) 10/06/2021   MCV 92.5 10/06/2021   PLT 222 10/06/2021      Component Value Date/Time   NA 140 10/06/2021 0110   NA 141 11/17/2019 1212   K 4.3 10/06/2021 0110   CL 109 10/06/2021 0110   CO2 22 10/06/2021 0110   GLUCOSE 127 (H) 10/06/2021 0110   BUN 10 10/06/2021 0110   BUN 17 11/17/2019 1212   CREATININE 0.67 10/06/2021 0110   CALCIUM 9.2 10/06/2021 0110   PROT 6.2 (L) 10/04/2021 1120   PROT 7.1 11/17/2019 1212   ALBUMIN 3.3 (L) 10/04/2021 1120   ALBUMIN 4.7 11/17/2019 1212   AST 18 10/04/2021 1120   ALT 14 10/04/2021 1120   ALKPHOS 58 10/04/2021 1120   BILITOT 0.7 10/04/2021 1120   BILITOT 0.4 11/17/2019 1212   GFRNONAA >60 10/06/2021 0110   GFRAA 92 11/17/2019 1212   Lab Results  Component Value Date   CHOL 210 (H) 11/17/2019   HDL 53 11/17/2019   LDLCALC 132 (H) 11/17/2019   TRIG 142 11/17/2019   CHOLHDL 4.0 11/17/2019   No results found for: HGBA1C Lab Results  Component Value Date   VITAMINB12 653 10/04/2021   Lab Results  Component Value Date   TSH 1.305 10/04/2021   10/06/21 MRI brain 1. No acute intracranial abnormality. 2. Stable MRI appearance of the brain since May. 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    ASSESSMENT AND PLAN  73 y.o. year old female here with:   Dx:  1. Worsening headaches   2. Chronic migraine with aura without status migrainosus, not intractable     PLAN:   WORSENING HEADACHES (since spring 2025) - check MRI brain w/wo   MIGRAINE TREATMENT PLAN:  MIGRAINE  PREVENTION  LIFESTYLE CHANGES -Stop or avoid smoking -Decrease or avoid caffeine  / alcohol -Eat and sleep on a regular schedule -Exercise several times per week  - cannot use topiramate  (tried and no benefit) - cannot use propranolol (already on metoprolol ) - cannot use amitriptyline  (already on sertraline )  Consider 2nd line (if 1st line not working or tolerated) - start atogepant  (Qulipta ) 60mg  daily - erenumab (Aimovig) 70mg  monthly (may increase to 140mg  monthly) - fremanezumab (Ajovy) 225mg  monthly (or 675mg  every 3 months) - galazanezumab (Emgality) 240mg  loading dose; then 120mg  monthly  MIGRAINE RESCUE  - ibuprofen, tylenol  as needed - start rizatriptan  (Maxalt ) 10mg  as needed for breakthrough headache; may repeat x 1 after 2 hours; max 2 tabs per day or 8 per month - start rimegepant (Nurtec) 75mg  as needed for breakthrough headache; max 8 per month   MILD SHORT TERM MEMORY LOSS (could be related to pain, anxiety, depression issues) - safety / supervision issues reviewed - daily physical activity / exercise (at least 15-30 minutes) - eat more plants / vegetables - increase social activities, brain stimulation, games, puzzles, hobbies, crafts, arts, music - aim for at least 7-8 hours sleep per night (or more) - avoid smoking  and alcohol - caution with medications, finances, driving  Return in about 6 months (around 06/04/2024) for MyChart visit (15 min).       EDUARD FABIENE HANLON, MD 12/03/2023, 3:22 PM Certified in Neurology, Neurophysiology and Neuroimaging  [facility] [address], [address] [place], [place] [address] [phone]

## 2023-12-04 ENCOUNTER — Telehealth: Payer: Self-pay

## 2023-12-04 NOTE — Telephone Encounter (Signed)
   I have sent the PT a MyChart message asking to upload nay newer insurance cards or info and if unable I advised PT to reach out to [facility] to clarify.

## 2023-12-05 ENCOUNTER — Emergency Department (HOSPITAL_BASED_OUTPATIENT_CLINIC_OR_DEPARTMENT_OTHER)
Admission: EM | Admit: 2023-12-05 | Discharge: 2023-12-06 | Disposition: A | Discharge status: Home / Self Care | Attending: Emergency Medicine | Admitting: Emergency Medicine

## 2023-12-05 ENCOUNTER — Encounter (HOSPITAL_BASED_OUTPATIENT_CLINIC_OR_DEPARTMENT_OTHER): Payer: Self-pay | Admitting: Emergency Medicine

## 2023-12-05 DIAGNOSIS — G43809 Other migraine, not intractable, without status migrainosus: Secondary | ICD-10-CM | POA: Insufficient documentation

## 2023-12-05 MED ORDER — ONDANSETRON HCL 4 MG/2ML IJ SOLN: 4.0000 mg | Freq: Once | INTRAMUSCULAR | Status: DC

## 2023-12-05 MED ORDER — ONDANSETRON HCL 4 MG/2ML IJ SOLN
4.0000 mg | Freq: Once | INTRAMUSCULAR | Status: AC
  Administered 2023-12-05: 4 mg via INTRAVENOUS
  Filled 2023-12-05: qty 2

## 2023-12-05 MED ORDER — ONDANSETRON 4 MG PO TBDP
4.0000 mg | ORAL_TABLET | Freq: Once | ORAL | Status: DC
  Filled 2023-12-05: qty 1

## 2023-12-05 MED ORDER — SODIUM CHLORIDE 0.9 % IV BOLUS
1000.0000 mL | Freq: Once | INTRAVENOUS | Status: AC
  Administered 2023-12-05: 1000 mL via INTRAVENOUS

## 2023-12-05 MED ORDER — MORPHINE SULFATE (PF) 4 MG/ML IV SOLN
4.0000 mg | Freq: Once | INTRAVENOUS | Status: AC
  Administered 2023-12-05: 4 mg via INTRAVENOUS
  Filled 2023-12-05: qty 1

## 2023-12-05 MED ORDER — KETOROLAC TROMETHAMINE 30 MG/ML IJ SOLN
15.0000 mg | Freq: Once | INTRAMUSCULAR | Status: AC
  Administered 2023-12-05: 15 mg via INTRAVENOUS
  Filled 2023-12-05: qty 1

## 2023-12-05 MED ORDER — METOCLOPRAMIDE HCL 5 MG/ML IJ SOLN
10.0000 mg | Freq: Once | INTRAMUSCULAR | Status: AC
  Administered 2023-12-05: 10 mg via INTRAVENOUS
  Filled 2023-12-05: qty 2

## 2023-12-05 NOTE — ED Provider Notes (Addendum)
 [place] EMERGENCY DEPARTMENT AT [facility] Provider Note   CSN: [id number] Arrival date & time: 12/05/23  8062     Patient presents with: Headache   Caroline [name] is a 73 y.o. female.  With a history of migraine headaches who presents for headache.  Recurrent migraine headache that started 4 days ago and persisted since the onset.  Seen for similar symptoms on August 21.  Has tried many different medications from her neurology team for refractory migraine headaches.  No head trauma focal weakness or other deficits at this time.  Feels similar to prior migraines.  Associated photophobia and nausea and vomiting    Headache      Prior to Admission medications   Medication Sig Start Date End Date Taking? Authorizing Provider  acetaminophen  (TYLENOL ) 325 MG tablet Take 1-2 tablets (325-650 mg total) by mouth every 6 (six) hours as needed for mild pain (pain score 1-3 or temp > 100.5). 10/02/21   Magnant, Charles L, PA-C  albuterol  (VENTOLIN  HFA) 108 (90 Base) MCG/ACT inhaler Inhale 2 puffs into the lungs every 6 (six) hours as needed for wheezing or shortness of breath. 11/17/19   Melonie Tori Mikel CHRISTELLA, MD  Atogepant  (QULIPTA ) 60 MG TABS Take 1 tablet (60 mg total) by mouth daily. 12/03/23   Penumalli, Vikram R, MD  cholecalciferol (VITAMIN D3) 25 MCG (1000 UNIT) tablet 1 tablet Orally Once a day    [provider]  diclofenac Sodium (VOLTAREN) 1 % GEL Apply 2 g topically daily as needed (achillles pain).    [provider]  famotidine  (PEPCID ) 20 MG tablet Take 1 tablet (20 mg total) by mouth 2 (two) times daily. 11/17/19   Melonie Tori Mikel CHRISTELLA, MD  fluticasone  (FLONASE ) 50 MCG/ACT nasal spray Place 1 spray into both nostrils 2 (two) times daily. Patient taking differently: Place 1 spray into both nostrils daily as needed for allergies. 03/24/18   Melonie Tori Mikel CHRISTELLA, MD  hydroquinone  4 % cream Apply topically at bedtime. Apply to affected areas of face at  bedtime for 4 months. 01/22/23   Alm Delon SAILOR, DO  levocetirizine (XYZAL) 5 MG tablet Take 5 mg by mouth daily.    [provider]  metoprolol  succinate (TOPROL -XL) 100 MG 24 hr tablet TAKE 1 TABLET EVERY DAY. TAKE WITH OR IMMEDIATELY FOLLOWING A MEAL. 10/27/19   Melonie Tori, Mikel CHRISTELLA, MD  montelukast (SINGULAIR) 10 MG tablet Take 10 mg by mouth daily.    [provider]  ondansetron  (ZOFRAN ) 8 MG tablet Take 1 tablet (8 mg total) by mouth every 8 (eight) hours as needed for nausea or vomiting. 03/29/20   Skeet Juliene SAUNDERS, DO  ondansetron  (ZOFRAN -ODT) 4 MG disintegrating tablet Take 1 tablet (4 mg total) by mouth every 8 (eight) hours as needed for nausea or vomiting. 11/26/23   Theotis Peers M, PA-C  Rimegepant Sulfate (NURTEC) 75 MG TBDP Take 1 tablet (75 mg total) by mouth daily as needed. 12/03/23   Penumalli, Vikram R, MD  rizatriptan  (MAXALT -MLT) 10 MG disintegrating tablet Take 1 tablet (10 mg total) by mouth as needed for migraine. May repeat in 2 hours if needed 12/03/23   Penumalli, Vikram R, MD  rosuvastatin (CRESTOR) 10 MG tablet Take 10 mg by mouth at bedtime. 06/24/21   [provider]  Safety Seal Miscellaneous MISC MELAXAMIC CREAM WITH TRANEXAMIC ACID  5% KOJIC ACID USP 2% VIT C USP 2.5% HYALURONIC ACID EXCP 0.1% - apply to affected areas BID for 4 months.  Patient not taking: Reported on 12/03/2023 09/01/23   Alm Delon SAILOR, DO  sertraline  (ZOLOFT ) 100 MG tablet TAKE 1 AND 1/2 TABLETS EVERY DAY Patient taking differently: Take 200 mg by mouth daily. TAKE 1 AND 1/2 TABLETS EVERY DAY 10/27/19   Melonie Colonel, Mikel HERO, MD  SYMBICORT 80-4.5 MCG/ACT inhaler Inhale 2 puffs into the lungs 2 (two) times daily as needed (shortness of breath). 03/26/20   [provider]  vitamin B-12 (CYANOCOBALAMIN) 500 MCG tablet Take 1,000 mcg by mouth daily.    [provider]    Allergies: Anesthesia s-i-40 [propofol ], Hydromorphone , Oxycontin [oxycodone hcl],  Tetracyclines & related, and Vicodin [hydrocodone-acetaminophen ]    Review of Systems  Neurological:  Positive for headaches.    Updated Vital Signs BP (!) 156/93 (BP Location: Right Arm)   Pulse 100   Temp 98.6 F (37 C) (Oral)   Resp 20   SpO2 97%   Physical Exam Vitals and nursing note reviewed.  HENT:     Head: Normocephalic and atraumatic.  Eyes:     Pupils: Pupils are equal, round, and reactive to light.  Cardiovascular:     Rate and Rhythm: Normal rate and regular rhythm.  Pulmonary:     Effort: Pulmonary effort is normal.     Breath sounds: Normal breath sounds.  Abdominal:     Palpations: Abdomen is soft.     Tenderness: There is no abdominal tenderness.  Skin:    General: Skin is warm and dry.  Neurological:     Mental Status: She is alert.     Sensory: No sensory deficit.     Motor: No weakness.  Psychiatric:        Mood and Affect: Mood normal.     (all labs ordered are listed, but only abnormal results are displayed) Labs Reviewed - No data to display  EKG: None  Radiology: No results found.   Procedures   Medications Ordered in the ED  ondansetron  (ZOFRAN ) injection 4 mg (has no administration in time range)  sodium chloride  0.9 % bolus 1,000 mL (1,000 mLs Intravenous New Bag/Given 12/05/23 2157)  ketorolac  (TORADOL ) 30 MG/ML injection 15 mg (15 mg Intravenous Given 12/05/23 2154)  metoCLOPramide  (REGLAN ) injection 10 mg (10 mg Intravenous Given 12/05/23 2155)  ondansetron  (ZOFRAN ) injection 4 mg (4 mg Intravenous Given 12/05/23 2155)  morphine  (PF) 4 MG/ML injection 4 mg (4 mg Intravenous Given 12/05/23 2255)    Clinical Course as of 12/05/23 2352  Sat Dec 05, 2023  2352 Unfortunately patient began to feel recurrent nausea and dry heaving when she was preparing for discharge.  Will try Zofran  and continue to monitor here.  LILLETTE Ozell Marine DO, am transitioning care of this patient to the oncoming provider pending reevaluation and disposition  [MP]    Clinical Course User Index [MP] Marine Ozell LABOR, DO                                 Medical Decision Making 73 year old female with history as above presenting for recurrent migraine headache.  No focal neurologic deficits.  Feels similar to prior migraines.  Associated photophobia nausea vomiting.  Improvement after treatment with IV fluids Toradol  Reglan  Zofran  and 1 dose of morphine .  She will follow-up with her neurology team  Risk Prescription drug management.        Final diagnoses:  Other migraine without status migrainosus, not intractable  ED Discharge Orders     None          Pamella Ozell LABOR, DO 12/05/23 2346    Pamella Ozell LABOR, DO 12/05/23 2352

## 2023-12-05 NOTE — ED Triage Notes (Signed)
 Migraine with nausea x Wednesday Seen for similar on 11/26/2023 Started on new meds from neuro but unable to keep down

## 2023-12-05 NOTE — Discharge Instructions (Addendum)
 You were seen in the emerged dept for migraine headache Your symptoms resolved after a migraine cocktail including Toradol  Reglan  morphine  Zofran  and fluids You need to follow-up with your neurologist to discuss additional management of your migraine headaches Return to the emergency room for severe headaches or any other concerns

## 2023-12-06 MED ORDER — HALOPERIDOL LACTATE 5 MG/ML IJ SOLN
2.0000 mg | Freq: Once | INTRAMUSCULAR | Status: AC
  Administered 2023-12-06: 2 mg via INTRAMUSCULAR
  Filled 2023-12-06: qty 1

## 2023-12-07 ENCOUNTER — Emergency Department (HOSPITAL_BASED_OUTPATIENT_CLINIC_OR_DEPARTMENT_OTHER)
Admission: EM | Admit: 2023-12-07 | Discharge: 2023-12-07 | Disposition: A | Discharge status: Home / Self Care | Attending: Emergency Medicine | Admitting: Emergency Medicine

## 2023-12-07 ENCOUNTER — Other Ambulatory Visit: Payer: Self-pay

## 2023-12-07 ENCOUNTER — Emergency Department (HOSPITAL_BASED_OUTPATIENT_CLINIC_OR_DEPARTMENT_OTHER)

## 2023-12-07 ENCOUNTER — Encounter (HOSPITAL_BASED_OUTPATIENT_CLINIC_OR_DEPARTMENT_OTHER): Payer: Self-pay

## 2023-12-07 DIAGNOSIS — F12188 Cannabis abuse with other cannabis-induced disorder: Secondary | ICD-10-CM | POA: Insufficient documentation

## 2023-12-07 DIAGNOSIS — I6782 Cerebral ischemia: Secondary | ICD-10-CM | POA: Insufficient documentation

## 2023-12-07 DIAGNOSIS — R1013 Epigastric pain: Secondary | ICD-10-CM | POA: Diagnosis present

## 2023-12-07 DIAGNOSIS — R519 Headache, unspecified: Secondary | ICD-10-CM

## 2023-12-07 DIAGNOSIS — F1310 Sedative, hypnotic or anxiolytic abuse, uncomplicated: Secondary | ICD-10-CM | POA: Insufficient documentation

## 2023-12-07 DIAGNOSIS — R42 Dizziness and giddiness: Secondary | ICD-10-CM | POA: Diagnosis not present

## 2023-12-07 DIAGNOSIS — K269 Duodenal ulcer, unspecified as acute or chronic, without hemorrhage or perforation: Secondary | ICD-10-CM | POA: Insufficient documentation

## 2023-12-07 DIAGNOSIS — K2920 Alcoholic gastritis without bleeding: Secondary | ICD-10-CM | POA: Insufficient documentation

## 2023-12-07 LAB — CBG MONITORING, ED: Glucose-Capillary: 100 mg/dL — ABNORMAL HIGH (ref 70–99)

## 2023-12-07 MED ORDER — PROCHLORPERAZINE MALEATE 10 MG PO TABS
5.0000 mg | ORAL_TABLET | Freq: Once | ORAL | Status: AC
  Administered 2023-12-07: 5 mg via ORAL
  Filled 2023-12-07: qty 1

## 2023-12-07 MED ORDER — VALPROATE SODIUM 100 MG/ML IV SOLN
INTRAVENOUS | Status: AC
  Filled 2023-12-07: qty 5

## 2023-12-07 MED ORDER — MORPHINE SULFATE (PF) 4 MG/ML IV SOLN: 4.0000 mg | Freq: Once | INTRAVENOUS | Status: DC

## 2023-12-07 MED ORDER — DIPHENHYDRAMINE HCL 50 MG/ML IJ SOLN: 12.5000 mg | Freq: Once | INTRAMUSCULAR | Status: DC

## 2023-12-07 MED ORDER — SODIUM CHLORIDE 0.9 % IV BOLUS
1000.0000 mL | Freq: Once | INTRAVENOUS | Status: AC
  Administered 2023-12-07: 1000 mL via INTRAVENOUS

## 2023-12-07 MED ORDER — DEXAMETHASONE SODIUM PHOSPHATE 10 MG/ML IJ SOLN
10.0000 mg | Freq: Once | INTRAMUSCULAR | Status: AC
  Administered 2023-12-07: 10 mg via INTRAVENOUS
  Filled 2023-12-07: qty 1

## 2023-12-07 MED ORDER — KETOROLAC TROMETHAMINE 15 MG/ML IJ SOLN
15.0000 mg | Freq: Once | INTRAMUSCULAR | Status: AC
  Administered 2023-12-07: 15 mg via INTRAVENOUS
  Filled 2023-12-07: qty 1

## 2023-12-07 MED ORDER — PROCHLORPERAZINE 25 MG RE SUPP
25.0000 mg | Freq: Two times a day (BID) | RECTAL | 0 refills | Status: AC | PRN
Start: 2023-12-07 — End: ?

## 2023-12-07 MED ORDER — ACETAMINOPHEN 500 MG PO TABS
1000.0000 mg | ORAL_TABLET | Freq: Once | ORAL | Status: DC
Start: 2023-12-07 — End: 2023-12-07

## 2023-12-07 MED ORDER — METOCLOPRAMIDE HCL 5 MG/ML IJ SOLN
10.0000 mg | Freq: Once | INTRAMUSCULAR | Status: AC
  Administered 2023-12-07: 10 mg via INTRAVENOUS
  Filled 2023-12-07: qty 2

## 2023-12-07 MED ORDER — VALPROATE SODIUM 100 MG/ML IV SOLN
1000.0000 mg | Freq: Once | INTRAVENOUS | Status: AC
  Administered 2023-12-07: 1000 mg via INTRAVENOUS
  Filled 2023-12-07: qty 10

## 2023-12-07 MED ORDER — MAGNESIUM SULFATE 2 GM/50ML IV SOLN
2.0000 g | Freq: Once | INTRAVENOUS | Status: AC
  Administered 2023-12-07: 2 g via INTRAVENOUS
  Filled 2023-12-07: qty 50

## 2023-12-07 NOTE — ED Notes (Signed)
 RN to bedside to talk about discharge due to continued returning 10/10 head pain. Patient requesting to talk to family about options and plan for discharge or admission with continued pain management.

## 2023-12-07 NOTE — ED Provider Notes (Signed)
 [place] EMERGENCY DEPARTMENT AT [facility] Provider Note   CSN: [id number] Arrival date & time: 12/07/23  9143     Patient presents with: Dizziness   Caroline [name] is a 73 y.o. female history of hypertension, hyperlipidemia, migraines presents with complaints of intermittent headache x 1 month.  Patient has been seen multiple occurrences including over the weekend for similar complaints.  Patient does feel that her headache is worsening and now involves the entirety of her head.  States her headache typically primarily involves her right side of her face.  Has associated dizziness, nausea and vomiting.  Dizziness is described as room spinning, worse with head movement..  Has no associated chest pain or shortness of breath.  Is not associated with any blurred or double vision.  No extremity weakness, numbness or tingling.  No difficulty ambulating or speaking.  Is not associated with any URI symptoms.  Was evaluated by her neurologist within the past week.  Had adjustments to her preventative medications with plan for outpatient MRI.    Dizziness     Past Medical History:  Diagnosis Date   Allergy    Zyrtec, Benadryl    Anxiety    Zoloft  since several years   Asthma    Balance problem    Cataract    Complication of anesthesia    GERD (gastroesophageal reflux disease)    Hyperlipidemia    Hypertension    Memory changes    Migraines    topmax and Imitrex    Past Surgical History:  Procedure Laterality Date   ABDOMINAL HYSTERECTOMY  2016   uterine prolapse; ovaries INTACT   BLADDER REPAIR     BREAST BIOPSY     NL   BREAST EXCISIONAL BIOPSY     BREAST SURGERY     breast bx x 2 in 1980s; benign   CARPAL TUNNEL RELEASE Bilateral    CATARACT EXTRACTION, BILATERAL  2018   TOTAL KNEE ARTHROPLASTY Left 10/01/2021   Procedure: LEFT TOTAL KNEE ARTHROPLASTY;  Surgeon: Addie Cordella Hamilton, MD;  Location: [facility] OR;  Service: Orthopedics;  Laterality: Left;     Prior to  Admission medications   Medication Sig Start Date End Date Taking? Authorizing Provider  prochlorperazine  (COMPAZINE ) 25 MG suppository Place 1 suppository (25 mg total) rectally every 12 (twelve) hours as needed for nausea or vomiting. 12/07/23  Yes Donnajean Lynwood DEL, PA-C  acetaminophen  (TYLENOL ) 325 MG tablet Take 1-2 tablets (325-650 mg total) by mouth every 6 (six) hours as needed for mild pain (pain score 1-3 or temp > 100.5). 10/02/21   Magnant, Carlin CROME, PA-C  albuterol  (VENTOLIN  HFA) 108 (90 Base) MCG/ACT inhaler Inhale 2 puffs into the lungs every 6 (six) hours as needed for wheezing or shortness of breath. 11/17/19   Melonie Tori Mikel CHRISTELLA, MD  Atogepant  (QULIPTA ) 60 MG TABS Take 1 tablet (60 mg total) by mouth daily. 12/03/23   Penumalli, Vikram R, MD  cholecalciferol (VITAMIN D3) 25 MCG (1000 UNIT) tablet 1 tablet Orally Once a day    [provider]  diclofenac Sodium (VOLTAREN) 1 % GEL Apply 2 g topically daily as needed (achillles pain).    [provider]  famotidine  (PEPCID ) 20 MG tablet Take 1 tablet (20 mg total) by mouth 2 (two) times daily. 11/17/19   Melonie Tori Mikel CHRISTELLA, MD  fluticasone  (FLONASE ) 50 MCG/ACT nasal spray Place 1 spray into both nostrils 2 (two) times daily. Patient taking differently: Place 1 spray into both nostrils daily as needed  for allergies. 03/24/18   Melonie Tori Mikel CHRISTELLA, MD  hydroquinone  4 % cream Apply topically at bedtime. Apply to affected areas of face at bedtime for 4 months. 01/22/23   Alm Delon SAILOR, DO  levocetirizine (XYZAL) 5 MG tablet Take 5 mg by mouth daily.    [provider]  metoprolol  succinate (TOPROL -XL) 100 MG 24 hr tablet TAKE 1 TABLET EVERY DAY. TAKE WITH OR IMMEDIATELY FOLLOWING A MEAL. 10/27/19   Melonie Tori, Mikel CHRISTELLA, MD  montelukast (SINGULAIR) 10 MG tablet Take 10 mg by mouth daily.    [provider]  ondansetron  (ZOFRAN ) 8 MG tablet Take 1 tablet (8 mg total) by mouth every 8 (eight) hours  as needed for nausea or vomiting. 03/29/20   Skeet Juliene SAUNDERS, DO  ondansetron  (ZOFRAN -ODT) 4 MG disintegrating tablet Take 1 tablet (4 mg total) by mouth every 8 (eight) hours as needed for nausea or vomiting. 11/26/23   Theotis Peers M, PA-C  Rimegepant Sulfate (NURTEC) 75 MG TBDP Take 1 tablet (75 mg total) by mouth daily as needed. 12/03/23   Penumalli, Vikram R, MD  rizatriptan  (MAXALT -MLT) 10 MG disintegrating tablet Take 1 tablet (10 mg total) by mouth as needed for migraine. May repeat in 2 hours if needed 12/03/23   Penumalli, Vikram R, MD  rosuvastatin (CRESTOR) 10 MG tablet Take 10 mg by mouth at bedtime. 06/24/21   [provider]  Safety Seal Miscellaneous MISC MELAXAMIC CREAM WITH TRANEXAMIC ACID  5% KOJIC ACID USP 2% VIT C USP 2.5% HYALURONIC ACID EXCP 0.1% - apply to affected areas BID for 4 months. Patient not taking: Reported on 12/03/2023 09/01/23   Alm Delon SAILOR, DO  sertraline  (ZOLOFT ) 100 MG tablet TAKE 1 AND 1/2 TABLETS EVERY DAY Patient taking differently: Take 200 mg by mouth daily. TAKE 1 AND 1/2 TABLETS EVERY DAY 10/27/19   Melonie Tori, Mikel CHRISTELLA, MD  SYMBICORT 80-4.5 MCG/ACT inhaler Inhale 2 puffs into the lungs 2 (two) times daily as needed (shortness of breath). 03/26/20   [provider]  vitamin B-12 (CYANOCOBALAMIN) 500 MCG tablet Take 1,000 mcg by mouth daily.    [provider]    Allergies: Anesthesia s-i-40 [propofol ], Hydromorphone , Oxycontin [oxycodone hcl], Tetracyclines & related, and Vicodin [hydrocodone-acetaminophen ]    Review of Systems  Neurological:  Positive for dizziness.    Updated Vital Signs BP (!) 151/97   Pulse (!) 107   Temp 98.5 F (36.9 C) (Oral)   Resp 14   SpO2 96%   Physical Exam Vitals and nursing note reviewed.  Constitutional:      General: She is not in acute distress.    Appearance: She is well-developed.  HENT:     Head: Normocephalic and atraumatic.  Eyes:     Conjunctiva/sclera: Conjunctivae  normal.  Cardiovascular:     Rate and Rhythm: Normal rate and regular rhythm.     Heart sounds: No murmur heard. Pulmonary:     Effort: Pulmonary effort is normal. No respiratory distress.     Breath sounds: Normal breath sounds.  Abdominal:     Palpations: Abdomen is soft.     Tenderness: There is no abdominal tenderness.  Musculoskeletal:        General: No swelling.     Cervical back: Neck supple.  Skin:    General: Skin is warm and dry.     Capillary Refill: Capillary refill takes less than 2 seconds.  Neurological:     Mental Status: She is alert.  Comments: Patient is alert and oriented. There is no abnormal phonation. Symmetric smile without facial droop. Moves all extremities spontaneously. 5/5 strength in upper and lower extremities. . No sensation deficit. There is no nystagmus. EOMI, PERRL. Coordination intact with finger to nose and normal ambulation.    Psychiatric:        Mood and Affect: Mood normal.     (all labs ordered are listed, but only abnormal results are displayed) Labs Reviewed  CBG MONITORING, ED - Abnormal; Notable for the following components:      Result Value   Glucose-Capillary 100 (*)    All other components within normal limits    EKG: EKG Interpretation Date/Time:  Monday December 07 2023 09:08:33 EDT Ventricular Rate:  94 PR Interval:  154 QRS Duration:  92 QT Interval:  357 QTC Calculation: 447 R Axis:   10  Text Interpretation: Sinus rhythm Borderline repolarization abnormality Nonspecific T wave changes Reconfirmed by Jerrol Agent ([id number]) on 12/07/2023 9:12:53 AM  Radiology: CT Head Wo Contrast Result Date: 12/07/2023 CLINICAL DATA:  Worsening headaches. EXAM: CT HEAD WITHOUT CONTRAST TECHNIQUE: Contiguous axial images were obtained from the base of the skull through the vertex without intravenous contrast. RADIATION DOSE REDUCTION: This exam was performed according to the departmental dose-optimization program which includes  automated exposure control, adjustment of the mA and/or kV according to patient size and/or use of iterative reconstruction technique. COMPARISON:  10/04/2021 FINDINGS: Brain: There is no evidence for acute hemorrhage, hydrocephalus, mass lesion, or abnormal extra-axial fluid collection. No definite CT evidence for acute infarction. Patchy low attenuation in the deep hemispheric and periventricular white matter is nonspecific, but likely reflects chronic microvascular ischemic demyelination. Vascular: No hyperdense vessel or unexpected calcification. Skull: No evidence for fracture. No worrisome lytic or sclerotic lesion. Sinuses/Orbits: Similar chronic opacification of a posterior left ethmoid sinus. Visualized portions of the globes and intraorbital fat are unremarkable. Other: None. IMPRESSION: 1. No acute intracranial abnormality. 2. Chronic small vessel ischemic disease. Electronically Signed   By: Camellia Candle M.D.   On: 12/07/2023 09:46     Procedures   Medications Ordered in the ED  diphenhydrAMINE  (BENADRYL ) injection 12.5 mg (12.5 mg Intravenous Not Given 12/07/23 1003)  valproate (DEPACON ) 100 MG/ML injection (  Not Given 12/07/23 1433)  valproate (DEPACON ) 100 MG/ML injection (500 mg  Not Given 12/07/23 1355)  ketorolac  (TORADOL ) 15 MG/ML injection 15 mg (15 mg Intravenous Given 12/07/23 1002)  metoCLOPramide  (REGLAN ) injection 10 mg (10 mg Intravenous Given 12/07/23 1003)  sodium chloride  0.9 % bolus 1,000 mL (0 mLs Intravenous Stopped 12/07/23 1336)  dexamethasone  (DECADRON ) injection 10 mg (10 mg Intravenous Given 12/07/23 1009)  prochlorperazine  (COMPAZINE ) tablet 5 mg (5 mg Oral Given 12/07/23 1252)  valproate (DEPACON ) 1,000 mg in dextrose  5 % 50 mL IVPB (0 mg Intravenous Stopped 12/07/23 1515)  magnesium  sulfate IVPB 2 g 50 mL (0 g Intravenous Stopped 12/07/23 1320)    Clinical Course as of 12/07/23 1517  Mon Dec 07, 2023  0934 Patient with history of migraines evaluated with complaints of  intermittent headache x 1 month associated with episodic dizziness, nausea and vomiting.  Her headache was not sudden or maximal in onset.  Is not associated with any neurodeficits on exam.  She is noted to be hypertensive, however has not taken her blood pressure medication.  Given the extended timeline and increasing severity of her headache we will obtain CT imaging here in the ED.  Do not have MRI available here  today.  Will additionally start with migraine cocktail. [JT]  1010 CT Head Wo Contrast No acute intracranial abnormality, chronic small vessel ischemic disease [JT]  1107 Patient reevaluated.  Has had some improvement, now rates 6 out of 10 compared to 10 out of 10 earlier.  Will consult neurology for further recommendations and to see if patient MRI can be expedited. [JT]  1228 Discussed patient with Dr. Michaela, did not feel that any urgent MRI was indicated today.  Recommended provide and dose of magnesium , Depakote and Compazine  while in the ED.  However will ultimately need outpatient neurology follow-up. [JT]  1513 Upon reevaluation patient reports some persistent symptoms.  Neuroexam remains benign.  Patient would still prefer to go home.  She would rather be managed outpatient.  Will send in Compazine  suppository.  She will continue her outpatient migraine medications and contact her neurologist tomorrow to expedite MRI.  Will provide information for Epley maneuver to trial.  Patient and her family are in agreement with plan.  Patient is ambulatory and tolerates p.o. [JT]    Clinical Course User Index [JT] Donnajean Lynwood DEL, PA-C                                 Medical Decision Making Amount and/or Complexity of Data Reviewed Radiology: ordered.  Risk Prescription drug management.   This patient presents to the ED with chief complaint(s) of headache.  The complaint involves an extensive differential diagnosis and also carries with it a high risk of complications and  morbidity.   Pertinent past medical history as listed in HPI  The differential diagnosis includes  Headache was not sudden or maximal in onset, it has been intermittent for the past month.  Therefore do not suspect subarachnoid hemorrhage.  There have been no falls or trauma to be concern for intracranial hemorrhage.  History and exam not consistent with central vertigo.  Given the chronic nature of her symptoms do not suspect giant cell.   Additional history obtained: Additional history obtained from family Records reviewed Care Everywhere/External Records  Disposition:   Patient will be discharged home. The patient has been appropriately medically screened and/or stabilized in the ED. I have low suspicion for any other emergent medical condition which would require further screening, evaluation or treatment in the ED or require inpatient management. At time of discharge the patient is hemodynamically stable and in no acute distress. I have discussed work-up results and diagnosis with patient and answered all questions. Patient is agreeable with discharge plan. We discussed strict return precautions for returning to the emergency department and they verbalized understanding.     Social Determinants of Health:   none  This note was dictated with voice recognition software.  Despite best efforts at proofreading, errors may have occurred which can change the documentation meaning.       Final diagnoses:  Bad headache    ED Discharge Orders          Ordered    prochlorperazine  (COMPAZINE ) 25 MG suppository  Every 12 hours PRN        12/07/23 1512               Donnajean Lynwood DEL DEVONNA 12/07/23 1517    Jerrol Lynwood, MD 12/07/23 1702

## 2023-12-07 NOTE — Discharge Instructions (Addendum)
 You were evaluated in the emergency room for headache.  Your CT imaging did not show any significant abnormality.  A prescription for Compazine  seen suppository was sent into your pharmacy.  Please contact your neurology office tomorrow for further evaluation.

## 2023-12-07 NOTE — ED Notes (Signed)
Patient given water and crackers for PO challenge.

## 2023-12-07 NOTE — ED Triage Notes (Signed)
 She c/o generalized h/a and persistent dizziness x ` 10 days. She denies fever, and is in no distress. She is alert and oriented x 4 with clear speech.

## 2023-12-07 NOTE — ED Notes (Signed)
 Pt to CT via wheelchair, NAD at this time.

## 2023-12-08 NOTE — Telephone Encounter (Signed)
 Leita from [employer] has called to report that Nurtec is not covered on members formulary, they are asking to be called to discuss an alternative [phone]

## 2023-12-09 ENCOUNTER — Telehealth: Payer: Self-pay | Admitting: Diagnostic Neuroimaging

## 2023-12-09 MED ORDER — UBRELVY 50 MG PO TABS: 50.0000 mg | ORAL_TABLET | ORAL | 6 refills | Status: AC | PRN

## 2023-12-09 NOTE — Telephone Encounter (Signed)
 Meds ordered this encounter  Medications   Ubrogepant  (UBRELVY ) 50 MG TABS    Sig: Take 1 tablet (50 mg total) by mouth as needed. May repeat x 1 tab after 2 hours; max 2 tabs per day or 8 per month    Dispense:  8 tablet    Refill:  6    EDUARD FABIENE HANLON, MD 12/09/2023, 2:19 PM Certified in Neurology, Neurophysiology and Neuroimaging  [facility] [address] [place], [place] [address] [phone]

## 2023-12-09 NOTE — Telephone Encounter (Signed)
 MRI order was sent to [facility] to schedule and they left her a voice mail on 8/28.

## 2023-12-10 ENCOUNTER — Other Ambulatory Visit (HOSPITAL_COMMUNITY): Payer: Self-pay

## 2023-12-10 ENCOUNTER — Telehealth: Payer: Self-pay

## 2023-12-10 NOTE — Telephone Encounter (Signed)
      Insurance is asking for answers to the above questions-please advise.

## 2023-12-10 NOTE — Telephone Encounter (Signed)
 Pharmacy Patient Advocate Encounter   Received notification from Physician's Office that prior authorization for Ubrelvy  is required/requested.   Insurance verification completed.   The patient is insured through [employer] .   Per test claim: PA required; PA submitted to above mentioned insurance via Latent Key/confirmation #/EOC [id number] Status is pending

## 2023-12-11 ENCOUNTER — Other Ambulatory Visit (HOSPITAL_COMMUNITY): Payer: Self-pay

## 2023-12-11 NOTE — Telephone Encounter (Signed)
 Pt is asking for a call back from RN to discuss what happens after the time frame of 9-4 to 12-31.  Pt still has trouble with dizzy episodes, she'd like a call to discuss.

## 2023-12-11 NOTE — Telephone Encounter (Signed)
 Pharmacy Patient Advocate Encounter  Received notification from [employer] that Prior Authorization for Ubrelvy  has been APPROVED from 12/10/2023 to 04/06/2024. Ran test claim, Copay is $47.00. This test claim was processed through [facility]- copay amounts may vary at other pharmacies due to pharmacy/plan contracts, or as the patient moves through the different stages of their insurance plan.   PA #/Case ID/Reference #: [id number]

## 2023-12-11 NOTE — Telephone Encounter (Signed)
 Spoke w/Caroline [name] to make her aware when the authorization ends for Ubrelvy  on 04/06/24, if the medication is working and she wants to continue, our PA team will request another auth. Caroline [name] voiced understanding. Caroline [name] stated she is unable to afford copay of $500 for the Qulipta  so she has not picked it up. Informed Caroline [name] will inquire as to whether Qulipta  has a program to help with cost and get back with her. Caroline [name] voiced understanding and thanks. Caroline [name] stated she is having dizziness when she turns over in bed or stands up too quickly when she has a migraine. She reported on one occasion, dizziness woke her up from sleep and when she opened her eyes she felt like everything was coming toward her. Caroline [name] stated she only has dizziness when she has a migraine. Informed Caroline [name] dizziness is noted and will reach out to her once info received regarding any financial assistance for Qulipta . Caroline [name] voiced understanding and thanks for the call back.

## 2023-12-17 DIAGNOSIS — M9903 Segmental and somatic dysfunction of lumbar region: Secondary | ICD-10-CM | POA: Diagnosis not present

## 2023-12-17 DIAGNOSIS — M9901 Segmental and somatic dysfunction of cervical region: Secondary | ICD-10-CM | POA: Diagnosis not present

## 2023-12-17 DIAGNOSIS — M9905 Segmental and somatic dysfunction of pelvic region: Secondary | ICD-10-CM | POA: Diagnosis not present

## 2023-12-17 DIAGNOSIS — M9902 Segmental and somatic dysfunction of thoracic region: Secondary | ICD-10-CM | POA: Diagnosis not present

## 2023-12-18 ENCOUNTER — Encounter: Payer: Self-pay | Admitting: Diagnostic Neuroimaging

## 2023-12-24 DIAGNOSIS — R110 Nausea: Secondary | ICD-10-CM | POA: Diagnosis not present

## 2023-12-24 DIAGNOSIS — E538 Deficiency of other specified B group vitamins: Secondary | ICD-10-CM | POA: Diagnosis not present

## 2023-12-24 DIAGNOSIS — R7301 Impaired fasting glucose: Secondary | ICD-10-CM | POA: Diagnosis not present

## 2023-12-24 DIAGNOSIS — I10 Essential (primary) hypertension: Secondary | ICD-10-CM | POA: Diagnosis not present

## 2023-12-24 DIAGNOSIS — J302 Other seasonal allergic rhinitis: Secondary | ICD-10-CM | POA: Diagnosis not present

## 2023-12-24 DIAGNOSIS — Z0000 Encounter for general adult medical examination without abnormal findings: Secondary | ICD-10-CM | POA: Diagnosis not present

## 2023-12-24 DIAGNOSIS — J4520 Mild intermittent asthma, uncomplicated: Secondary | ICD-10-CM | POA: Diagnosis not present

## 2023-12-24 DIAGNOSIS — F3341 Major depressive disorder, recurrent, in partial remission: Secondary | ICD-10-CM | POA: Diagnosis not present

## 2023-12-24 DIAGNOSIS — Z23 Encounter for immunization: Secondary | ICD-10-CM | POA: Diagnosis not present

## 2023-12-24 DIAGNOSIS — E7800 Pure hypercholesterolemia, unspecified: Secondary | ICD-10-CM | POA: Diagnosis not present

## 2023-12-24 DIAGNOSIS — G43709 Chronic migraine without aura, not intractable, without status migrainosus: Secondary | ICD-10-CM | POA: Diagnosis not present

## 2023-12-31 DIAGNOSIS — M9902 Segmental and somatic dysfunction of thoracic region: Secondary | ICD-10-CM | POA: Diagnosis not present

## 2023-12-31 DIAGNOSIS — M9905 Segmental and somatic dysfunction of pelvic region: Secondary | ICD-10-CM | POA: Diagnosis not present

## 2023-12-31 DIAGNOSIS — M9903 Segmental and somatic dysfunction of lumbar region: Secondary | ICD-10-CM | POA: Diagnosis not present

## 2023-12-31 DIAGNOSIS — M9901 Segmental and somatic dysfunction of cervical region: Secondary | ICD-10-CM | POA: Diagnosis not present

## 2024-01-04 ENCOUNTER — Ambulatory Visit
Admission: RE | Admit: 2024-01-04 | Discharge: 2024-01-04 | Disposition: A | Discharge status: Home / Self Care | Source: Ambulatory Visit | Attending: Diagnostic Neuroimaging | Admitting: Diagnostic Neuroimaging

## 2024-01-04 DIAGNOSIS — R519 Headache, unspecified: Secondary | ICD-10-CM | POA: Diagnosis not present

## 2024-01-04 MED ORDER — GADOPICLENOL 0.5 MMOL/ML IV SOLN
7.5000 mL | Freq: Once | INTRAVENOUS | Status: AC | PRN
Start: 2024-01-04 — End: 2024-01-04
  Administered 2024-01-04: 7.5 mL via INTRAVENOUS

## 2024-01-06 ENCOUNTER — Telehealth: Payer: Self-pay | Admitting: Diagnostic Neuroimaging

## 2024-01-06 NOTE — Telephone Encounter (Signed)
 Pt called stating she has received MRI results  and would like Md to call to go over results

## 2024-01-07 ENCOUNTER — Ambulatory Visit

## 2024-01-07 ENCOUNTER — Ambulatory Visit: Admitting: Internal Medicine

## 2024-01-07 ENCOUNTER — Encounter: Payer: Self-pay | Admitting: Internal Medicine

## 2024-01-07 VITALS — BP 117/75 | HR 66 | Temp 98.7°F | Ht 61.0 in | Wt 143.4 lb

## 2024-01-07 DIAGNOSIS — J4489 Other specified chronic obstructive pulmonary disease: Secondary | ICD-10-CM | POA: Diagnosis not present

## 2024-01-07 DIAGNOSIS — J45909 Unspecified asthma, uncomplicated: Secondary | ICD-10-CM | POA: Diagnosis not present

## 2024-01-07 DIAGNOSIS — M9901 Segmental and somatic dysfunction of cervical region: Secondary | ICD-10-CM | POA: Diagnosis not present

## 2024-01-07 DIAGNOSIS — M9903 Segmental and somatic dysfunction of lumbar region: Secondary | ICD-10-CM | POA: Diagnosis not present

## 2024-01-07 DIAGNOSIS — M9905 Segmental and somatic dysfunction of pelvic region: Secondary | ICD-10-CM | POA: Diagnosis not present

## 2024-01-07 DIAGNOSIS — M9902 Segmental and somatic dysfunction of thoracic region: Secondary | ICD-10-CM | POA: Diagnosis not present

## 2024-01-07 MED ORDER — BUDESONIDE-FORMOTEROL FUMARATE 80-4.5 MCG/ACT IN AERO: INHALATION_SPRAY | RESPIRATORY_TRACT | 12 refills | Status: AC

## 2024-01-07 NOTE — Progress Notes (Signed)
 Caroline [name], female    DOB: 1950-10-10   MRN: [id number]   Brief patient profile:  46 yowf smoked only as teenager with spring > fall allergies growing up in [place]   and better p shots thru mid 20s and fine until right before moved To [place]  in [place] 2016 started albuterol  assoc with  itching /sneezing rx with zyrtec somewhat effective   referred to pulmonary clinic 01/07/2024 by DR Rolinda  for poorly controlled wheeze cough/ 20023 with perennial pattern and rx symbicort 80 helped some but   Pt not previously seen by PCCM service.      History of Present Illness   01/07/2024  Pulmonary/ 1st office eval/Marina Boerner   Dyspnea:  - Not limited by breathing from desired activities   Cough: minimal mucoid not noct or in am  Sleep: flat bed one pillow  SABA use: none lately  02 ldz:wnwz     No obvious day to day or daytime pattern/variability or assoc excess/ purulent sputum or mucus plugs or hemoptysis or cp or chest tightness, subjective wheeze or overt sinus or hb symptoms.    Also denies any obvious fluctuation of symptoms with weather or environmental changes or other aggravating or alleviating factors except as outlined above   No unusual exposure hx or h/o childhood pna/ asthma or knowledge of premature birth.  Current Allergies, Complete Past Medical History, Past Surgical History, Family History, and Social History were reviewed in [employer] record.  ROS  The following are not active complaints unless bolded Hoarseness, sore throat, dysphagia, dental problems, itching, sneezing,  nasal congestion or discharge of excess mucus or purulent secretions, ear ache,   fever, chills, sweats, unintended wt loss or wt gain, classically pleuritic or exertional cp,  orthopnea pnd or arm/hand swelling  or leg swelling, presyncope, palpitations, abdominal pain, anorexia, nausea, vomiting, diarrhea  or change in bowel habits or change in bladder habits, change in stools or change in  urine, dysuria, hematuria,  rash, arthralgias, visual complaints, headache, numbness, weakness or ataxia or problems with walking or coordination,  change in mood or  memory.             Outpatient Medications Prior to Visit  Medication Sig Dispense Refill   acetaminophen  (TYLENOL ) 325 MG tablet Take 1-2 tablets (325-650 mg total) by mouth every 6 (six) hours as needed for mild pain (pain score 1-3 or temp > 100.5). 60 tablet 0   albuterol  (VENTOLIN  HFA) 108 (90 Base) MCG/ACT inhaler Inhale 2 puffs into the lungs every 6 (six) hours as needed for wheezing or shortness of breath. 8 g 3   Atogepant  (QULIPTA ) 60 MG TABS Take 1 tablet (60 mg total) by mouth daily. 30 tablet 6   cholecalciferol (VITAMIN D3) 25 MCG (1000 UNIT) tablet 1 tablet Orally Once a day     diclofenac Sodium (VOLTAREN) 1 % GEL Apply 2 g topically daily as needed (achillles pain).     famotidine  (PEPCID ) 20 MG tablet Take 1 tablet (20 mg total) by mouth 2 (two) times daily. 60 tablet 3   fluticasone  (FLONASE ) 50 MCG/ACT nasal spray Place 1 spray into both nostrils 2 (two) times daily. (Patient taking differently: Place 1 spray into both nostrils daily as needed for allergies.) 16 g 6   hydroquinone  4 % cream Apply topically at bedtime. Apply to affected areas of face at bedtime for 4 months. 28.35 g 2   levocetirizine (XYZAL) 5 MG tablet Take 5 mg by mouth  daily.     metoprolol  succinate (TOPROL -XL) 100 MG 24 hr tablet TAKE 1 TABLET EVERY DAY. TAKE WITH OR IMMEDIATELY FOLLOWING A MEAL. 90 tablet 3   montelukast (SINGULAIR) 10 MG tablet Take 10 mg by mouth daily.     ondansetron  (ZOFRAN ) 8 MG tablet Take 1 tablet (8 mg total) by mouth every 8 (eight) hours as needed for nausea or vomiting. 30 tablet 5   ondansetron  (ZOFRAN -ODT) 4 MG disintegrating tablet Take 1 tablet (4 mg total) by mouth every 8 (eight) hours as needed for nausea or vomiting. 20 tablet 0   prochlorperazine  (COMPAZINE ) 25 MG suppository Place 1 suppository (25  mg total) rectally every 12 (twelve) hours as needed for nausea or vomiting. 12 suppository 0   rizatriptan  (MAXALT -MLT) 10 MG disintegrating tablet Take 1 tablet (10 mg total) by mouth as needed for migraine. May repeat in 2 hours if needed 9 tablet 11   rosuvastatin (CRESTOR) 10 MG tablet Take 10 mg by mouth at bedtime.     sertraline  (ZOLOFT ) 100 MG tablet TAKE 1 AND 1/2 TABLETS EVERY DAY (Patient taking differently: Take 200 mg by mouth daily. TAKE 1 AND 1/2 TABLETS EVERY DAY) 135 tablet 3   Ubrogepant  (UBRELVY ) 50 MG TABS Take 1 tablet (50 mg total) by mouth as needed. May repeat x 1 tab after 2 hours; max 2 tabs per day or 8 per month 8 tablet 6   vitamin B-12 (CYANOCOBALAMIN) 500 MCG tablet Take 1,000 mcg by mouth daily.     SYMBICORT 80-4.5 MCG/ACT inhaler Inhale 2 puffs into the lungs 2 (two) times daily as needed (shortness of breath).     Safety Seal Miscellaneous MISC MELAXAMIC CREAM WITH TRANEXAMIC ACID  5% KOJIC ACID USP 2% VIT C USP 2.5% HYALURONIC ACID EXCP 0.1% - apply to affected areas BID for 4 months. (Patient not taking: Reported on 01/07/2024) 15 g 2   No facility-administered medications prior to visit.    Past Medical History:  Diagnosis Date   Allergy    Zyrtec, Benadryl    Anxiety    Zoloft  since several years   Asthma    Balance problem    Cataract    Complication of anesthesia    GERD (gastroesophageal reflux disease)    Hyperlipidemia    Hypertension    Memory changes    Migraines    topmax and Imitrex       Objective:     BP 117/75   Pulse 66   Temp 98.7 F (37.1 C) (Temporal)   Ht 5' 1 (1.549 m)   Wt 143 lb 6.4 oz (65 kg)   SpO2 95%   BMI 27.10 kg/m   SpO2: 95 % RA   Pleasant amb wf nad   HEENT : Oropharynx  clear      Nasal turbinates nl    NECK :  without  apparent JVD/ palpable Nodes/TM    LUNGS: no acc muscle use,  Nl contour chest which is clear to A and P bilaterally without cough on insp or exp maneuvers   CV:  RRR  no s3 or  murmur or increase in P2, and no edema   ABD:  soft and nontender   MS:  Gait nl   ext warm without deformities Or obvious joint restrictions  calf tenderness, cyanosis or clubbing    SKIN: warm and dry without lesions    NEURO:  alert, approp, nl sensorium with  no motor or cerebellar deficits apparent.   CXR PA  and Lateral:   01/07/2024 :    I personally reviewed images and impression is as follows:     Mild CM/ mild kyphosis/ no acute findings     Assessment   Assessment & Plan Asthmatic bronchitis , chronic (HCC) Onset in [place]  arouind 2015 and move to [place] int 2016 - Allergy screen 01/07/2024 >  Eos 0. /  IgE   - 01/07/2024  After extensive coaching inhaler device,  effectiveness =    80% (short ti) so rex  symbicort 80  up to 2 bid  Based on two studies from NEJM  378; 20 p 1865 (2018) and 380 : p2020-30 (2019) in pts with mild asthma it is reasonable to use low dose symbicort eg 80 2bid prn flare in this setting but I emphasized this was only shown with symbicort and takes advantage of the rapid onset of action but is not the same as rescue therapy but can be stopped once the acute symptoms have resolved and the need for rescue has been minimized (< 2 x weekly)    Reviewed the dual action of symbicort emphasizing the max dose for at least a week or until all asthma symptoms have resolved, which comes last, to take advantage of the dual ingredients   F/u can be q 6 m, call sooner prn        Each maintenance medication was reviewed in detail including emphasizing most importantly the difference between maintenance and prns and under what circumstances the prns are to be triggered using an action plan format where appropriate.  Total time for H and P, chart review, counseling, reviewing hfa device(s) and generating customized AVS unique to this office visit / same day charting = 62  min new pt evaluation.         AVS  Patient Instructions  Plan A = Automatic = Always=   Symbicrt 80 Take 0- 2 puffs first thing in am and then another 2 puffs about 12 hours later.  (Breztri sample is one twice daily )   Work on inhaler technique:  relax and gently blow all the way out then take a nice smooth full deep breath back in, triggering the inhaler at same time you start breathing in.  Hold breath in for at least  5 seconds if you can. Blow out symbicort  thru nose. Rinse and gargle with water  when done.  If mouth or throat bother you at all,  try brushing teeth/gums/tongue with arm and hammer toothpaste/ make a slurry and gargle and spit out.      Plan B = Backup (to supplement plan A, not to replace it) Use your albuterol  inhaler as a rescue medication to be used if you can't catch your breath by resting or slowing your pace  or doing a relaxed purse lip breathing pattern.  - The less you use it, the better it will work when you need it. - Ok to use the inhaler up to 2 puffs  every 4 hours if you must but call for appointment if use goes up over your usual need - Don't leave home without it !!  (think of it like the spare tire or starter fluid for your car)    Please remember to go to the  x-ray department  for your tests - we will call you with the results when they are available    Please remember to go to the lab department   for your tests - we will call  you with the results when they are available.      Please schedule a follow up visit in 6 months but call sooner if needed - bring inhalers        Ozell America, MD 01/07/2024

## 2024-01-07 NOTE — Patient Instructions (Addendum)
 Plan A = Automatic = Always=  Symbicrt 80 Take 0- 2 puffs first thing in am and then another 2 puffs about 12 hours later.  (Breztri sample is one twice daily )   Work on inhaler technique:  relax and gently blow all the way out then take a nice smooth full deep breath back in, triggering the inhaler at same time you start breathing in.  Hold breath in for at least  5 seconds if you can. Blow out symbicort  thru nose. Rinse and gargle with water  when done.  If mouth or throat bother you at all,  try brushing teeth/gums/tongue with arm and hammer toothpaste/ make a slurry and gargle and spit out.      Plan B = Backup (to supplement plan A, not to replace it) Use your albuterol  inhaler as a rescue medication to be used if you can't catch your breath by resting or slowing your pace  or doing a relaxed purse lip breathing pattern.  - The less you use it, the better it will work when you need it. - Ok to use the inhaler up to 2 puffs  every 4 hours if you must but call for appointment if use goes up over your usual need - Don't leave home without it !!  (think of it like the spare tire or starter fluid for your car)    Please remember to go to the  x-ray department  for your tests - we will call you with the results when they are available    Please remember to go to the lab department   for your tests - we will call you with the results when they are available.      Please schedule a follow up visit in 6 months but call sooner if needed - bring inhalers

## 2024-01-07 NOTE — Assessment & Plan Note (Addendum)
 Onset in [place]  arouind 2015 and move to [place] int 2016 - Allergy screen 01/07/2024 >  Eos 0. /  IgE   - 01/07/2024  After extensive coaching inhaler device,  effectiveness =    80% (short ti) so rex  symbicort 80  up to 2 bid  Based on two studies from NEJM  378; 20 p 1865 (2018) and 380 : p2020-30 (2019) in pts with mild asthma it is reasonable to use low dose symbicort eg 80 2bid prn flare in this setting but I emphasized this was only shown with symbicort and takes advantage of the rapid onset of action but is not the same as rescue therapy but can be stopped once the acute symptoms have resolved and the need for rescue has been minimized (< 2 x weekly)    Reviewed the dual action of symbicort emphasizing the max dose for at least a week or until all asthma symptoms have resolved, which comes last, to take advantage of the dual ingredients   F/u can be q 6 m, call sooner prn        Each maintenance medication was reviewed in detail including emphasizing most importantly the difference between maintenance and prns and under what circumstances the prns are to be triggered using an action plan format where appropriate.  Total time for H and P, chart review, counseling, reviewing hfa device(s) and generating customized AVS unique to this office visit / same day charting = 62  min new pt evaluation.

## 2024-01-08 ENCOUNTER — Ambulatory Visit: Payer: Self-pay | Admitting: Internal Medicine

## 2024-01-08 LAB — CBC WITH DIFFERENTIAL/PLATELET
Basophils Absolute: 0.1 K/uL (ref 0.0–0.1)
Basophils Relative: 1.1 % (ref 0.0–3.0)
Eosinophils Absolute: 0.2 K/uL (ref 0.0–0.7)
Eosinophils Relative: 3.8 % (ref 0.0–5.0)
HCT: 37.2 % (ref 36.0–46.0)
Hemoglobin: 12.6 g/dL (ref 12.0–15.0)
Lymphocytes Relative: 25.8 % (ref 12.0–46.0)
Lymphs Abs: 1.2 K/uL (ref 0.7–4.0)
MCHC: 33.8 g/dL (ref 30.0–36.0)
MCV: 92.7 fl (ref 78.0–100.0)
Monocytes Absolute: 0.4 K/uL (ref 0.1–1.0)
Monocytes Relative: 8.9 % (ref 3.0–12.0)
Neutro Abs: 2.9 K/uL (ref 1.4–7.7)
Neutrophils Relative %: 60.4 % (ref 43.0–77.0)
Platelets: 251 K/uL (ref 150.0–400.0)
RBC: 4.01 Mil/uL (ref 3.87–5.11)
RDW: 14.6 % (ref 11.5–15.5)
WBC: 4.8 K/uL (ref 4.0–10.5)

## 2024-01-08 LAB — IGE: IgE (Immunoglobulin E), Serum: 30 kU/L (ref <=114)

## 2024-01-11 ENCOUNTER — Ambulatory Visit: Payer: Self-pay | Admitting: Diagnostic Neuroimaging

## 2024-01-11 NOTE — Progress Notes (Signed)
 Atc x1 lmtcb

## 2024-01-13 NOTE — Progress Notes (Signed)
 ATC x2, sent mychart message. NFN

## 2024-01-13 NOTE — Progress Notes (Signed)
 ATC x2, sending MyChart message per protocol. NFN.

## 2024-01-14 NOTE — Progress Notes (Signed)
 Sending letter

## 2024-01-14 NOTE — Progress Notes (Signed)
 Called and spoke to pt - advised of CXR results per Dr. Darlean. Pt verbalized understanding, NFN.

## 2024-01-15 NOTE — Telephone Encounter (Signed)
 Copied from CRM [id number]. Topic: Clinical - Lab/Test Results >> Jan 11, 2024 12:22 PM Chantha C wrote: Reason for CRM: Patient is returning the office call on test results. Per CAL [place], CAL [place] is at lunch send a message. Please call back at [phone].  See phone note

## 2024-01-21 DIAGNOSIS — M9903 Segmental and somatic dysfunction of lumbar region: Secondary | ICD-10-CM | POA: Diagnosis not present

## 2024-01-21 DIAGNOSIS — M542 Cervicalgia: Secondary | ICD-10-CM | POA: Diagnosis not present

## 2024-01-21 DIAGNOSIS — M5032 Other cervical disc degeneration, mid-cervical region, unspecified level: Secondary | ICD-10-CM | POA: Diagnosis not present

## 2024-01-21 DIAGNOSIS — M9901 Segmental and somatic dysfunction of cervical region: Secondary | ICD-10-CM | POA: Diagnosis not present

## 2024-01-21 DIAGNOSIS — M9902 Segmental and somatic dysfunction of thoracic region: Secondary | ICD-10-CM | POA: Diagnosis not present

## 2024-01-21 DIAGNOSIS — M5441 Lumbago with sciatica, right side: Secondary | ICD-10-CM | POA: Diagnosis not present

## 2024-01-21 DIAGNOSIS — M9905 Segmental and somatic dysfunction of pelvic region: Secondary | ICD-10-CM | POA: Diagnosis not present

## 2024-01-28 DIAGNOSIS — M9903 Segmental and somatic dysfunction of lumbar region: Secondary | ICD-10-CM | POA: Diagnosis not present

## 2024-01-28 DIAGNOSIS — M9905 Segmental and somatic dysfunction of pelvic region: Secondary | ICD-10-CM | POA: Diagnosis not present

## 2024-01-28 DIAGNOSIS — M542 Cervicalgia: Secondary | ICD-10-CM | POA: Diagnosis not present

## 2024-01-28 DIAGNOSIS — M9902 Segmental and somatic dysfunction of thoracic region: Secondary | ICD-10-CM | POA: Diagnosis not present

## 2024-01-28 DIAGNOSIS — M5032 Other cervical disc degeneration, mid-cervical region, unspecified level: Secondary | ICD-10-CM | POA: Diagnosis not present

## 2024-01-28 DIAGNOSIS — M5441 Lumbago with sciatica, right side: Secondary | ICD-10-CM | POA: Diagnosis not present

## 2024-01-28 DIAGNOSIS — M9901 Segmental and somatic dysfunction of cervical region: Secondary | ICD-10-CM | POA: Diagnosis not present

## 2024-02-04 DIAGNOSIS — M542 Cervicalgia: Secondary | ICD-10-CM | POA: Diagnosis not present

## 2024-02-04 DIAGNOSIS — M9905 Segmental and somatic dysfunction of pelvic region: Secondary | ICD-10-CM | POA: Diagnosis not present

## 2024-02-04 DIAGNOSIS — M9901 Segmental and somatic dysfunction of cervical region: Secondary | ICD-10-CM | POA: Diagnosis not present

## 2024-02-04 DIAGNOSIS — M5032 Other cervical disc degeneration, mid-cervical region, unspecified level: Secondary | ICD-10-CM | POA: Diagnosis not present

## 2024-02-04 DIAGNOSIS — M5441 Lumbago with sciatica, right side: Secondary | ICD-10-CM | POA: Diagnosis not present

## 2024-02-04 DIAGNOSIS — M9902 Segmental and somatic dysfunction of thoracic region: Secondary | ICD-10-CM | POA: Diagnosis not present

## 2024-02-04 DIAGNOSIS — M9903 Segmental and somatic dysfunction of lumbar region: Secondary | ICD-10-CM | POA: Diagnosis not present

## 2024-02-11 DIAGNOSIS — M5441 Lumbago with sciatica, right side: Secondary | ICD-10-CM | POA: Diagnosis not present

## 2024-02-11 DIAGNOSIS — M5032 Other cervical disc degeneration, mid-cervical region, unspecified level: Secondary | ICD-10-CM | POA: Diagnosis not present

## 2024-02-11 DIAGNOSIS — M9905 Segmental and somatic dysfunction of pelvic region: Secondary | ICD-10-CM | POA: Diagnosis not present

## 2024-02-11 DIAGNOSIS — M9902 Segmental and somatic dysfunction of thoracic region: Secondary | ICD-10-CM | POA: Diagnosis not present

## 2024-02-11 DIAGNOSIS — M542 Cervicalgia: Secondary | ICD-10-CM | POA: Diagnosis not present

## 2024-02-11 DIAGNOSIS — M9901 Segmental and somatic dysfunction of cervical region: Secondary | ICD-10-CM | POA: Diagnosis not present

## 2024-02-11 DIAGNOSIS — M9903 Segmental and somatic dysfunction of lumbar region: Secondary | ICD-10-CM | POA: Diagnosis not present

## 2024-02-18 DIAGNOSIS — M9901 Segmental and somatic dysfunction of cervical region: Secondary | ICD-10-CM | POA: Diagnosis not present

## 2024-02-18 DIAGNOSIS — M5032 Other cervical disc degeneration, mid-cervical region, unspecified level: Secondary | ICD-10-CM | POA: Diagnosis not present

## 2024-02-18 DIAGNOSIS — M9905 Segmental and somatic dysfunction of pelvic region: Secondary | ICD-10-CM | POA: Diagnosis not present

## 2024-02-18 DIAGNOSIS — M542 Cervicalgia: Secondary | ICD-10-CM | POA: Diagnosis not present

## 2024-02-18 DIAGNOSIS — M9902 Segmental and somatic dysfunction of thoracic region: Secondary | ICD-10-CM | POA: Diagnosis not present

## 2024-02-18 DIAGNOSIS — M5441 Lumbago with sciatica, right side: Secondary | ICD-10-CM | POA: Diagnosis not present

## 2024-02-18 DIAGNOSIS — M9903 Segmental and somatic dysfunction of lumbar region: Secondary | ICD-10-CM | POA: Diagnosis not present

## 2024-02-22 ENCOUNTER — Ambulatory Visit: Admitting: Dermatology

## 2024-02-25 DIAGNOSIS — M9905 Segmental and somatic dysfunction of pelvic region: Secondary | ICD-10-CM | POA: Diagnosis not present

## 2024-02-25 DIAGNOSIS — M9903 Segmental and somatic dysfunction of lumbar region: Secondary | ICD-10-CM | POA: Diagnosis not present

## 2024-02-25 DIAGNOSIS — M9901 Segmental and somatic dysfunction of cervical region: Secondary | ICD-10-CM | POA: Diagnosis not present

## 2024-02-25 DIAGNOSIS — M542 Cervicalgia: Secondary | ICD-10-CM | POA: Diagnosis not present

## 2024-02-25 DIAGNOSIS — M5441 Lumbago with sciatica, right side: Secondary | ICD-10-CM | POA: Diagnosis not present

## 2024-02-25 DIAGNOSIS — M5032 Other cervical disc degeneration, mid-cervical region, unspecified level: Secondary | ICD-10-CM | POA: Diagnosis not present

## 2024-02-25 DIAGNOSIS — M9902 Segmental and somatic dysfunction of thoracic region: Secondary | ICD-10-CM | POA: Diagnosis not present

## 2024-02-29 DIAGNOSIS — M9902 Segmental and somatic dysfunction of thoracic region: Secondary | ICD-10-CM | POA: Diagnosis not present

## 2024-02-29 DIAGNOSIS — M5441 Lumbago with sciatica, right side: Secondary | ICD-10-CM | POA: Diagnosis not present

## 2024-02-29 DIAGNOSIS — M9901 Segmental and somatic dysfunction of cervical region: Secondary | ICD-10-CM | POA: Diagnosis not present

## 2024-02-29 DIAGNOSIS — M9905 Segmental and somatic dysfunction of pelvic region: Secondary | ICD-10-CM | POA: Diagnosis not present

## 2024-02-29 DIAGNOSIS — M9903 Segmental and somatic dysfunction of lumbar region: Secondary | ICD-10-CM | POA: Diagnosis not present

## 2024-02-29 DIAGNOSIS — M5032 Other cervical disc degeneration, mid-cervical region, unspecified level: Secondary | ICD-10-CM | POA: Diagnosis not present

## 2024-02-29 DIAGNOSIS — M542 Cervicalgia: Secondary | ICD-10-CM | POA: Diagnosis not present

## 2024-03-10 DIAGNOSIS — M9905 Segmental and somatic dysfunction of pelvic region: Secondary | ICD-10-CM | POA: Diagnosis not present

## 2024-03-10 DIAGNOSIS — M9901 Segmental and somatic dysfunction of cervical region: Secondary | ICD-10-CM | POA: Diagnosis not present

## 2024-03-10 DIAGNOSIS — M5441 Lumbago with sciatica, right side: Secondary | ICD-10-CM | POA: Diagnosis not present

## 2024-03-10 DIAGNOSIS — M5032 Other cervical disc degeneration, mid-cervical region, unspecified level: Secondary | ICD-10-CM | POA: Diagnosis not present

## 2024-03-10 DIAGNOSIS — M542 Cervicalgia: Secondary | ICD-10-CM | POA: Diagnosis not present

## 2024-03-10 DIAGNOSIS — M9902 Segmental and somatic dysfunction of thoracic region: Secondary | ICD-10-CM | POA: Diagnosis not present

## 2024-03-10 DIAGNOSIS — M9903 Segmental and somatic dysfunction of lumbar region: Secondary | ICD-10-CM | POA: Diagnosis not present

## 2024-05-31 ENCOUNTER — Telehealth: Admitting: Diagnostic Neuroimaging

## 2024-08-15 ENCOUNTER — Ambulatory Visit: Admitting: Dermatology

## 2024-09-01 ENCOUNTER — Ambulatory Visit: Admitting: Dermatology
